# Patient Record
Sex: Female | Born: 1950 | Race: Black or African American | Hispanic: No | Smoking: Former smoker
Health system: Southern US, Community
[De-identification: ages and names within clinical notes are randomized; demographics above are authoritative.]

## PROBLEM LIST (undated history)

## (undated) DIAGNOSIS — G709 Myoneural disorder, unspecified: Secondary | ICD-10-CM

## (undated) DIAGNOSIS — I639 Cerebral infarction, unspecified: Secondary | ICD-10-CM

## (undated) DIAGNOSIS — K429 Umbilical hernia without obstruction or gangrene: Secondary | ICD-10-CM

## (undated) DIAGNOSIS — I10 Essential (primary) hypertension: Secondary | ICD-10-CM

## (undated) DIAGNOSIS — D259 Leiomyoma of uterus, unspecified: Secondary | ICD-10-CM

## (undated) HISTORY — DX: Cerebral infarction, unspecified: I63.9

## (undated) HISTORY — DX: Umbilical hernia without obstruction or gangrene: K42.9

## (undated) HISTORY — PX: ANGIOPLASTY: SHX39

## (undated) HISTORY — PX: HAND SURGERY: SHX662

## (undated) HISTORY — PX: TOE SURGERY: SHX1073

## (undated) HISTORY — DX: Essential (primary) hypertension: I10

---

## 1998-09-12 ENCOUNTER — Encounter: Payer: Self-pay | Admitting: Emergency Medicine

## 1998-09-13 ENCOUNTER — Encounter: Payer: Self-pay | Admitting: Family Medicine

## 1998-09-13 ENCOUNTER — Inpatient Hospital Stay (HOSPITAL_COMMUNITY): Admission: EM | Admit: 1998-09-13 | Discharge: 1998-09-17 | Payer: Self-pay | Admitting: Emergency Medicine

## 1998-09-15 ENCOUNTER — Encounter: Payer: Self-pay | Admitting: Internal Medicine

## 2000-07-27 ENCOUNTER — Encounter: Payer: Self-pay | Admitting: Neurology

## 2000-07-27 ENCOUNTER — Encounter: Payer: Self-pay | Admitting: Emergency Medicine

## 2000-07-27 ENCOUNTER — Inpatient Hospital Stay (HOSPITAL_COMMUNITY): Admission: EM | Admit: 2000-07-27 | Discharge: 2000-08-04 | Payer: Self-pay | Admitting: Neurology

## 2000-07-29 ENCOUNTER — Encounter: Payer: Self-pay | Admitting: Neurology

## 2000-07-30 ENCOUNTER — Encounter: Payer: Self-pay | Admitting: Neurology

## 2000-08-09 ENCOUNTER — Ambulatory Visit (HOSPITAL_COMMUNITY): Admission: RE | Admit: 2000-08-09 | Discharge: 2000-08-09 | Payer: Self-pay | Admitting: Neurology

## 2000-08-12 ENCOUNTER — Ambulatory Visit (HOSPITAL_COMMUNITY): Admission: RE | Admit: 2000-08-12 | Discharge: 2000-08-12 | Payer: Self-pay | Admitting: Neurology

## 2000-08-21 ENCOUNTER — Encounter: Payer: Self-pay | Admitting: Neurology

## 2000-08-21 ENCOUNTER — Ambulatory Visit (HOSPITAL_COMMUNITY): Admission: RE | Admit: 2000-08-21 | Discharge: 2000-08-21 | Payer: Self-pay | Admitting: Interventional Radiology

## 2000-09-01 ENCOUNTER — Inpatient Hospital Stay (HOSPITAL_COMMUNITY): Admission: AD | Admit: 2000-09-01 | Discharge: 2000-09-06 | Payer: Self-pay | Admitting: Neurology

## 2000-09-01 ENCOUNTER — Encounter: Payer: Self-pay | Admitting: Neurology

## 2000-09-04 ENCOUNTER — Encounter: Payer: Self-pay | Admitting: Neurology

## 2001-02-26 ENCOUNTER — Encounter: Payer: Self-pay | Admitting: Emergency Medicine

## 2001-02-26 ENCOUNTER — Encounter: Payer: Self-pay | Admitting: Neurology

## 2001-02-26 ENCOUNTER — Inpatient Hospital Stay (HOSPITAL_COMMUNITY): Admission: AD | Admit: 2001-02-26 | Discharge: 2001-03-13 | Payer: Self-pay | Admitting: Neurology

## 2001-03-04 ENCOUNTER — Encounter: Payer: Self-pay | Admitting: Neurology

## 2001-03-06 ENCOUNTER — Encounter: Payer: Self-pay | Admitting: Neurology

## 2001-03-07 ENCOUNTER — Encounter: Payer: Self-pay | Admitting: Neurology

## 2002-02-12 ENCOUNTER — Ambulatory Visit (HOSPITAL_COMMUNITY): Admission: RE | Admit: 2002-02-12 | Discharge: 2002-02-12 | Payer: Self-pay | Admitting: Family Medicine

## 2002-02-12 ENCOUNTER — Encounter: Payer: Self-pay | Admitting: Family Medicine

## 2003-03-04 ENCOUNTER — Encounter: Payer: Self-pay | Admitting: Family Medicine

## 2003-03-04 ENCOUNTER — Ambulatory Visit (HOSPITAL_COMMUNITY): Admission: RE | Admit: 2003-03-04 | Discharge: 2003-03-04 | Payer: Self-pay | Admitting: Family Medicine

## 2003-03-24 ENCOUNTER — Ambulatory Visit (HOSPITAL_COMMUNITY): Admission: RE | Admit: 2003-03-24 | Discharge: 2003-03-24 | Payer: Self-pay | Admitting: Family Medicine

## 2003-06-14 ENCOUNTER — Encounter: Payer: Self-pay | Admitting: Family Medicine

## 2003-06-14 ENCOUNTER — Ambulatory Visit (HOSPITAL_COMMUNITY): Admission: RE | Admit: 2003-06-14 | Discharge: 2003-06-14 | Payer: Self-pay | Admitting: Family Medicine

## 2004-01-05 ENCOUNTER — Other Ambulatory Visit: Admission: RE | Admit: 2004-01-05 | Discharge: 2004-01-05 | Payer: Self-pay | Admitting: Family Medicine

## 2004-01-14 ENCOUNTER — Ambulatory Visit (HOSPITAL_COMMUNITY): Admission: RE | Admit: 2004-01-14 | Discharge: 2004-01-14 | Payer: Self-pay | Admitting: Family Medicine

## 2004-02-04 ENCOUNTER — Emergency Department (HOSPITAL_COMMUNITY): Admission: EM | Admit: 2004-02-04 | Discharge: 2004-02-04 | Payer: Self-pay | Admitting: Emergency Medicine

## 2004-06-26 ENCOUNTER — Ambulatory Visit: Payer: Self-pay | Admitting: Family Medicine

## 2004-07-06 ENCOUNTER — Ambulatory Visit: Payer: Self-pay | Admitting: Family Medicine

## 2004-08-15 ENCOUNTER — Ambulatory Visit: Payer: Self-pay | Admitting: Family Medicine

## 2004-08-24 ENCOUNTER — Ambulatory Visit: Payer: Self-pay | Admitting: Family Medicine

## 2004-09-10 ENCOUNTER — Emergency Department (HOSPITAL_COMMUNITY): Admission: EM | Admit: 2004-09-10 | Discharge: 2004-09-10 | Payer: Self-pay

## 2004-09-11 ENCOUNTER — Ambulatory Visit: Payer: Self-pay | Admitting: Family Medicine

## 2004-09-13 ENCOUNTER — Ambulatory Visit: Payer: Self-pay | Admitting: Family Medicine

## 2004-09-14 ENCOUNTER — Ambulatory Visit: Payer: Self-pay | Admitting: Nurse Practitioner

## 2004-09-14 ENCOUNTER — Ambulatory Visit (HOSPITAL_COMMUNITY): Admission: RE | Admit: 2004-09-14 | Discharge: 2004-09-14 | Payer: Self-pay | Admitting: Internal Medicine

## 2004-10-03 ENCOUNTER — Ambulatory Visit: Payer: Self-pay | Admitting: Family Medicine

## 2004-11-06 ENCOUNTER — Ambulatory Visit: Payer: Self-pay | Admitting: Family Medicine

## 2004-12-20 ENCOUNTER — Ambulatory Visit: Payer: Self-pay | Admitting: Family Medicine

## 2004-12-27 ENCOUNTER — Ambulatory Visit: Payer: Self-pay | Admitting: Family Medicine

## 2005-01-10 ENCOUNTER — Ambulatory Visit: Payer: Self-pay | Admitting: Family Medicine

## 2005-03-22 ENCOUNTER — Ambulatory Visit: Payer: Self-pay | Admitting: Family Medicine

## 2005-04-05 ENCOUNTER — Ambulatory Visit: Payer: Self-pay | Admitting: Internal Medicine

## 2005-04-19 ENCOUNTER — Ambulatory Visit: Payer: Self-pay | Admitting: Family Medicine

## 2005-04-30 ENCOUNTER — Ambulatory Visit (HOSPITAL_COMMUNITY): Admission: RE | Admit: 2005-04-30 | Discharge: 2005-04-30 | Payer: Self-pay | Admitting: Family Medicine

## 2005-05-02 ENCOUNTER — Emergency Department (HOSPITAL_COMMUNITY): Admission: EM | Admit: 2005-05-02 | Discharge: 2005-05-02 | Payer: Self-pay | Admitting: Emergency Medicine

## 2005-05-04 ENCOUNTER — Ambulatory Visit: Payer: Self-pay | Admitting: Family Medicine

## 2005-05-07 ENCOUNTER — Ambulatory Visit: Payer: Self-pay | Admitting: Family Medicine

## 2005-05-08 ENCOUNTER — Ambulatory Visit: Payer: Self-pay | Admitting: Family Medicine

## 2005-05-15 ENCOUNTER — Ambulatory Visit: Payer: Self-pay | Admitting: Family Medicine

## 2005-06-12 ENCOUNTER — Ambulatory Visit: Payer: Self-pay | Admitting: Family Medicine

## 2005-07-31 ENCOUNTER — Ambulatory Visit: Payer: Self-pay | Admitting: Family Medicine

## 2005-08-02 ENCOUNTER — Ambulatory Visit: Payer: Self-pay | Admitting: Family Medicine

## 2005-08-14 ENCOUNTER — Ambulatory Visit: Payer: Self-pay | Admitting: Family Medicine

## 2005-08-22 ENCOUNTER — Ambulatory Visit: Payer: Self-pay | Admitting: Family Medicine

## 2005-10-18 ENCOUNTER — Ambulatory Visit: Payer: Self-pay | Admitting: Family Medicine

## 2005-10-22 ENCOUNTER — Ambulatory Visit: Payer: Self-pay | Admitting: Family Medicine

## 2005-11-21 ENCOUNTER — Ambulatory Visit: Payer: Self-pay | Admitting: Family Medicine

## 2005-12-04 ENCOUNTER — Ambulatory Visit: Payer: Self-pay | Admitting: Family Medicine

## 2005-12-11 ENCOUNTER — Ambulatory Visit: Payer: Self-pay | Admitting: Family Medicine

## 2006-01-15 ENCOUNTER — Ambulatory Visit: Payer: Self-pay | Admitting: Family Medicine

## 2006-01-15 ENCOUNTER — Encounter (INDEPENDENT_AMBULATORY_CARE_PROVIDER_SITE_OTHER): Payer: Self-pay | Admitting: Family Medicine

## 2006-01-26 ENCOUNTER — Emergency Department (HOSPITAL_COMMUNITY): Admission: EM | Admit: 2006-01-26 | Discharge: 2006-01-26 | Payer: Self-pay | Admitting: Family Medicine

## 2006-02-07 ENCOUNTER — Ambulatory Visit: Payer: Self-pay | Admitting: Family Medicine

## 2006-02-14 ENCOUNTER — Ambulatory Visit: Payer: Self-pay | Admitting: Family Medicine

## 2006-03-28 ENCOUNTER — Ambulatory Visit: Payer: Self-pay | Admitting: Family Medicine

## 2006-04-04 ENCOUNTER — Ambulatory Visit: Payer: Self-pay | Admitting: Family Medicine

## 2006-04-09 ENCOUNTER — Ambulatory Visit: Payer: Self-pay | Admitting: Family Medicine

## 2006-04-17 ENCOUNTER — Ambulatory Visit: Payer: Self-pay | Admitting: Family Medicine

## 2006-05-02 ENCOUNTER — Ambulatory Visit: Payer: Self-pay | Admitting: Nurse Practitioner

## 2006-05-14 ENCOUNTER — Ambulatory Visit: Payer: Self-pay | Admitting: Family Medicine

## 2006-05-15 ENCOUNTER — Ambulatory Visit (HOSPITAL_COMMUNITY): Admission: RE | Admit: 2006-05-15 | Discharge: 2006-05-15 | Payer: Self-pay | Admitting: Family Medicine

## 2006-05-23 ENCOUNTER — Ambulatory Visit: Payer: Self-pay | Admitting: Family Medicine

## 2006-06-12 ENCOUNTER — Ambulatory Visit: Payer: Self-pay | Admitting: Family Medicine

## 2006-06-19 ENCOUNTER — Ambulatory Visit: Payer: Self-pay | Admitting: Family Medicine

## 2006-06-25 ENCOUNTER — Emergency Department (HOSPITAL_COMMUNITY): Admission: EM | Admit: 2006-06-25 | Discharge: 2006-06-25 | Payer: Self-pay | Admitting: Emergency Medicine

## 2006-07-10 ENCOUNTER — Other Ambulatory Visit: Admission: RE | Admit: 2006-07-10 | Discharge: 2006-07-10 | Payer: Self-pay | Admitting: Obstetrics and Gynecology

## 2006-07-10 ENCOUNTER — Encounter (INDEPENDENT_AMBULATORY_CARE_PROVIDER_SITE_OTHER): Payer: Self-pay | Admitting: *Deleted

## 2006-07-10 ENCOUNTER — Ambulatory Visit: Payer: Self-pay | Admitting: Obstetrics and Gynecology

## 2006-07-25 ENCOUNTER — Ambulatory Visit: Payer: Self-pay | Admitting: Internal Medicine

## 2006-07-25 ENCOUNTER — Ambulatory Visit: Payer: Self-pay | Admitting: *Deleted

## 2006-08-14 ENCOUNTER — Ambulatory Visit: Payer: Self-pay | Admitting: Internal Medicine

## 2006-08-27 ENCOUNTER — Ambulatory Visit: Payer: Self-pay | Admitting: Family Medicine

## 2006-10-02 ENCOUNTER — Ambulatory Visit: Payer: Self-pay | Admitting: Internal Medicine

## 2006-11-04 ENCOUNTER — Ambulatory Visit: Payer: Self-pay | Admitting: Family Medicine

## 2006-11-12 ENCOUNTER — Ambulatory Visit: Payer: Self-pay | Admitting: Family Medicine

## 2006-11-19 ENCOUNTER — Ambulatory Visit: Payer: Self-pay | Admitting: Family Medicine

## 2006-12-30 ENCOUNTER — Ambulatory Visit: Payer: Self-pay | Admitting: Family Medicine

## 2007-02-06 ENCOUNTER — Ambulatory Visit: Payer: Self-pay | Admitting: Family Medicine

## 2007-02-13 ENCOUNTER — Ambulatory Visit: Payer: Self-pay | Admitting: Family Medicine

## 2007-02-21 ENCOUNTER — Ambulatory Visit: Payer: Self-pay | Admitting: Family Medicine

## 2007-03-25 ENCOUNTER — Ambulatory Visit: Payer: Self-pay | Admitting: Family Medicine

## 2007-04-08 ENCOUNTER — Ambulatory Visit: Payer: Self-pay | Admitting: Family Medicine

## 2007-04-24 ENCOUNTER — Ambulatory Visit: Payer: Self-pay | Admitting: Family Medicine

## 2007-05-09 ENCOUNTER — Ambulatory Visit (HOSPITAL_COMMUNITY): Admission: RE | Admit: 2007-05-09 | Discharge: 2007-05-09 | Payer: Self-pay | Admitting: Family Medicine

## 2007-05-14 DIAGNOSIS — M109 Gout, unspecified: Secondary | ICD-10-CM

## 2007-05-14 DIAGNOSIS — E669 Obesity, unspecified: Secondary | ICD-10-CM

## 2007-05-14 DIAGNOSIS — I10 Essential (primary) hypertension: Secondary | ICD-10-CM

## 2007-05-14 DIAGNOSIS — J45909 Unspecified asthma, uncomplicated: Secondary | ICD-10-CM | POA: Insufficient documentation

## 2007-05-14 DIAGNOSIS — J301 Allergic rhinitis due to pollen: Secondary | ICD-10-CM | POA: Insufficient documentation

## 2007-05-16 DIAGNOSIS — D259 Leiomyoma of uterus, unspecified: Secondary | ICD-10-CM

## 2007-05-16 DIAGNOSIS — Z87891 Personal history of nicotine dependence: Secondary | ICD-10-CM | POA: Insufficient documentation

## 2007-05-21 ENCOUNTER — Ambulatory Visit: Payer: Self-pay | Admitting: Family Medicine

## 2007-06-24 ENCOUNTER — Ambulatory Visit: Payer: Self-pay | Admitting: Internal Medicine

## 2007-07-22 ENCOUNTER — Ambulatory Visit: Payer: Self-pay | Admitting: Family Medicine

## 2007-08-06 ENCOUNTER — Ambulatory Visit: Payer: Self-pay | Admitting: Family Medicine

## 2007-08-26 ENCOUNTER — Ambulatory Visit: Payer: Self-pay | Admitting: Nurse Practitioner

## 2007-10-08 ENCOUNTER — Ambulatory Visit: Payer: Self-pay | Admitting: Internal Medicine

## 2007-11-05 ENCOUNTER — Ambulatory Visit: Payer: Self-pay | Admitting: Family Medicine

## 2007-12-22 ENCOUNTER — Ambulatory Visit: Payer: Self-pay | Admitting: Family Medicine

## 2008-01-09 ENCOUNTER — Ambulatory Visit: Payer: Self-pay | Admitting: Internal Medicine

## 2008-02-11 ENCOUNTER — Ambulatory Visit: Payer: Self-pay | Admitting: Family Medicine

## 2008-02-23 ENCOUNTER — Ambulatory Visit: Payer: Self-pay | Admitting: Internal Medicine

## 2008-03-23 ENCOUNTER — Ambulatory Visit: Payer: Self-pay | Admitting: Internal Medicine

## 2008-04-22 ENCOUNTER — Ambulatory Visit: Payer: Self-pay | Admitting: Family Medicine

## 2008-04-22 LAB — CONVERTED CEMR LAB
INR: 2.6 — ABNORMAL HIGH (ref 0.0–1.5)
Prothrombin Time: 29.3 s — ABNORMAL HIGH (ref 11.6–15.2)

## 2008-05-19 ENCOUNTER — Ambulatory Visit: Payer: Self-pay | Admitting: Family Medicine

## 2008-05-19 LAB — CONVERTED CEMR LAB: Prothrombin Time: 28.2 s — ABNORMAL HIGH (ref 11.6–15.2)

## 2008-06-24 ENCOUNTER — Ambulatory Visit: Payer: Self-pay | Admitting: Family Medicine

## 2008-06-24 LAB — CONVERTED CEMR LAB: INR: 2.9 — ABNORMAL HIGH (ref 0.0–1.5)

## 2008-07-29 ENCOUNTER — Ambulatory Visit: Payer: Self-pay | Admitting: Family Medicine

## 2008-08-10 ENCOUNTER — Ambulatory Visit: Payer: Self-pay | Admitting: Family Medicine

## 2008-09-01 ENCOUNTER — Encounter (INDEPENDENT_AMBULATORY_CARE_PROVIDER_SITE_OTHER): Payer: Self-pay | Admitting: Internal Medicine

## 2008-09-01 ENCOUNTER — Ambulatory Visit: Payer: Self-pay | Admitting: Internal Medicine

## 2008-09-01 LAB — CONVERTED CEMR LAB
INR: 2 — ABNORMAL HIGH (ref 0.0–1.5)
Prothrombin Time: 24 s — ABNORMAL HIGH (ref 11.6–15.2)

## 2008-10-07 ENCOUNTER — Ambulatory Visit: Payer: Self-pay | Admitting: Family Medicine

## 2008-10-28 ENCOUNTER — Ambulatory Visit: Payer: Self-pay | Admitting: Family Medicine

## 2008-11-03 ENCOUNTER — Ambulatory Visit: Payer: Self-pay | Admitting: Internal Medicine

## 2008-12-02 ENCOUNTER — Ambulatory Visit: Payer: Self-pay | Admitting: Family Medicine

## 2009-01-12 ENCOUNTER — Ambulatory Visit: Payer: Self-pay | Admitting: Internal Medicine

## 2009-01-26 ENCOUNTER — Ambulatory Visit: Payer: Self-pay | Admitting: Family Medicine

## 2009-04-05 ENCOUNTER — Ambulatory Visit: Payer: Self-pay | Admitting: Family Medicine

## 2009-04-19 ENCOUNTER — Ambulatory Visit: Payer: Self-pay | Admitting: Family Medicine

## 2009-04-19 LAB — CONVERTED CEMR LAB
INR: 4.2 — ABNORMAL HIGH (ref 0.0–1.5)
Prothrombin Time: 44 s — ABNORMAL HIGH (ref 11.6–15.2)
aPTT: 53 s — ABNORMAL HIGH (ref 24–37)

## 2009-04-26 ENCOUNTER — Ambulatory Visit: Payer: Self-pay | Admitting: Internal Medicine

## 2009-05-17 ENCOUNTER — Ambulatory Visit: Payer: Self-pay | Admitting: Family Medicine

## 2009-05-17 LAB — CONVERTED CEMR LAB
AST: 18 units/L (ref 0–37)
Albumin: 3.7 g/dL (ref 3.5–5.2)
Alkaline Phosphatase: 53 units/L (ref 39–117)
BUN: 18 mg/dL (ref 6–23)
Basophils Absolute: 0 10*3/uL (ref 0.0–0.1)
Basophils Relative: 0 % (ref 0–1)
Creatinine, Ser: 1.1 mg/dL (ref 0.40–1.20)
Eosinophils Absolute: 0.1 10*3/uL (ref 0.0–0.7)
Eosinophils Relative: 1 % (ref 0–5)
Glucose, Bld: 156 mg/dL — ABNORMAL HIGH (ref 70–99)
HCT: 42.8 % (ref 36.0–46.0)
Hemoglobin: 14.1 g/dL (ref 12.0–15.0)
Lymphocytes Relative: 35 % (ref 12–46)
MCHC: 32.9 g/dL (ref 30.0–36.0)
MCV: 87.5 fL (ref 78.0–100.0)
Monocytes Absolute: 0.8 10*3/uL (ref 0.1–1.0)
Platelets: 299 10*3/uL (ref 150–400)
Potassium: 3.2 meq/L — ABNORMAL LOW (ref 3.5–5.3)
RDW: 14.2 % (ref 11.5–15.5)
Total Bilirubin: 0.3 mg/dL (ref 0.3–1.2)

## 2009-05-24 ENCOUNTER — Ambulatory Visit: Payer: Self-pay | Admitting: Family Medicine

## 2009-06-07 ENCOUNTER — Ambulatory Visit: Payer: Self-pay | Admitting: Family Medicine

## 2009-06-07 ENCOUNTER — Ambulatory Visit: Payer: Self-pay | Admitting: Internal Medicine

## 2009-06-10 ENCOUNTER — Emergency Department (HOSPITAL_COMMUNITY): Admission: EM | Admit: 2009-06-10 | Discharge: 2009-06-10 | Payer: Self-pay | Admitting: Emergency Medicine

## 2009-07-09 ENCOUNTER — Emergency Department (HOSPITAL_COMMUNITY): Admission: EM | Admit: 2009-07-09 | Discharge: 2009-07-09 | Payer: Self-pay | Admitting: Family Medicine

## 2009-07-13 ENCOUNTER — Ambulatory Visit: Payer: Self-pay | Admitting: Family Medicine

## 2009-08-11 ENCOUNTER — Ambulatory Visit: Payer: Self-pay | Admitting: Family Medicine

## 2009-08-25 ENCOUNTER — Ambulatory Visit: Payer: Self-pay | Admitting: Family Medicine

## 2009-09-19 ENCOUNTER — Inpatient Hospital Stay (HOSPITAL_COMMUNITY): Admission: EM | Admit: 2009-09-19 | Discharge: 2009-09-23 | Payer: Self-pay | Admitting: Emergency Medicine

## 2009-09-27 ENCOUNTER — Encounter (INDEPENDENT_AMBULATORY_CARE_PROVIDER_SITE_OTHER): Payer: Self-pay | Admitting: Family Medicine

## 2009-09-27 ENCOUNTER — Ambulatory Visit: Payer: Self-pay | Admitting: Internal Medicine

## 2009-09-27 LAB — CONVERTED CEMR LAB
ALT: 42 units/L — ABNORMAL HIGH (ref 0–35)
CO2: 26 meq/L (ref 19–32)
Calcium: 9.2 mg/dL (ref 8.4–10.5)
Chloride: 100 meq/L (ref 96–112)
Cholesterol: 125 mg/dL (ref 0–200)
Creatinine, Ser: 0.91 mg/dL (ref 0.40–1.20)
Eosinophils Absolute: 0.1 10*3/uL (ref 0.0–0.7)
Glucose, Bld: 135 mg/dL — ABNORMAL HIGH (ref 70–99)
Hemoglobin: 14 g/dL (ref 12.0–15.0)
Lymphs Abs: 3 10*3/uL (ref 0.7–4.0)
MCHC: 32.9 g/dL (ref 30.0–36.0)
MCV: 87.1 fL (ref 78.0–100.0)
Monocytes Absolute: 1.2 10*3/uL — ABNORMAL HIGH (ref 0.1–1.0)
Monocytes Relative: 6 % (ref 3–12)
Neutrophils Relative %: 79 % — ABNORMAL HIGH (ref 43–77)
RBC: 4.88 M/uL (ref 3.87–5.11)
Sodium: 139 meq/L (ref 135–145)
Total Protein: 6 g/dL (ref 6.0–8.3)
Triglycerides: 137 mg/dL (ref <150)
WBC: 20.8 10*3/uL — ABNORMAL HIGH (ref 4.0–10.5)

## 2009-10-11 ENCOUNTER — Ambulatory Visit: Payer: Self-pay | Admitting: Internal Medicine

## 2009-10-18 ENCOUNTER — Ambulatory Visit: Payer: Self-pay | Admitting: Family Medicine

## 2009-10-18 LAB — CONVERTED CEMR LAB
Eosinophils Relative: 1 % (ref 0–5)
HCT: 42.9 % (ref 36.0–46.0)
Hemoglobin: 13.5 g/dL (ref 12.0–15.0)
Lymphocytes Relative: 25 % (ref 12–46)
Lymphs Abs: 3.3 10*3/uL (ref 0.7–4.0)
Monocytes Absolute: 0.9 10*3/uL (ref 0.1–1.0)
Monocytes Relative: 7 % (ref 3–12)
Vit D, 25-Hydroxy: 30 ng/mL (ref 30–89)
WBC: 13.4 10*3/uL — ABNORMAL HIGH (ref 4.0–10.5)

## 2009-10-27 ENCOUNTER — Ambulatory Visit: Payer: Self-pay | Admitting: Family Medicine

## 2009-11-01 ENCOUNTER — Ambulatory Visit: Payer: Self-pay | Admitting: Family Medicine

## 2009-11-04 ENCOUNTER — Ambulatory Visit: Payer: Self-pay | Admitting: Family Medicine

## 2009-11-17 ENCOUNTER — Ambulatory Visit: Payer: Self-pay | Admitting: Family Medicine

## 2009-12-08 ENCOUNTER — Ambulatory Visit: Payer: Self-pay | Admitting: Family Medicine

## 2009-12-12 ENCOUNTER — Telehealth (INDEPENDENT_AMBULATORY_CARE_PROVIDER_SITE_OTHER): Payer: Self-pay | Admitting: *Deleted

## 2009-12-13 ENCOUNTER — Ambulatory Visit: Payer: Self-pay | Admitting: Internal Medicine

## 2009-12-29 ENCOUNTER — Ambulatory Visit: Payer: Self-pay | Admitting: Family Medicine

## 2010-01-05 ENCOUNTER — Ambulatory Visit: Payer: Self-pay | Admitting: Family Medicine

## 2010-01-24 ENCOUNTER — Ambulatory Visit: Payer: Self-pay | Admitting: Family Medicine

## 2010-02-02 ENCOUNTER — Ambulatory Visit: Payer: Self-pay | Admitting: Internal Medicine

## 2010-02-06 ENCOUNTER — Encounter: Payer: Self-pay | Admitting: Interventional Radiology

## 2010-02-17 ENCOUNTER — Encounter (INDEPENDENT_AMBULATORY_CARE_PROVIDER_SITE_OTHER): Payer: Self-pay | Admitting: Adult Health

## 2010-02-17 ENCOUNTER — Ambulatory Visit: Payer: Self-pay | Admitting: Internal Medicine

## 2010-02-17 LAB — CONVERTED CEMR LAB
CO2: 25 meq/L (ref 19–32)
Chloride: 105 meq/L (ref 96–112)
Glucose, Bld: 92 mg/dL (ref 70–99)
Potassium: 4 meq/L (ref 3.5–5.3)
Pro B Natriuretic peptide (BNP): 7.4 pg/mL (ref 0.0–100.0)
Sodium: 142 meq/L (ref 135–145)

## 2010-02-23 ENCOUNTER — Ambulatory Visit: Payer: Self-pay | Admitting: Family Medicine

## 2010-03-01 ENCOUNTER — Ambulatory Visit (HOSPITAL_COMMUNITY): Admission: RE | Admit: 2010-03-01 | Discharge: 2010-03-01 | Payer: Self-pay | Admitting: Family Medicine

## 2010-03-01 ENCOUNTER — Encounter (INDEPENDENT_AMBULATORY_CARE_PROVIDER_SITE_OTHER): Payer: Self-pay | Admitting: Family Medicine

## 2010-03-01 ENCOUNTER — Ambulatory Visit: Payer: Self-pay | Admitting: Vascular Surgery

## 2010-03-02 ENCOUNTER — Ambulatory Visit: Payer: Self-pay | Admitting: Internal Medicine

## 2010-03-06 ENCOUNTER — Ambulatory Visit: Payer: Self-pay | Admitting: Family Medicine

## 2010-03-10 ENCOUNTER — Ambulatory Visit (HOSPITAL_COMMUNITY): Admission: RE | Admit: 2010-03-10 | Discharge: 2010-03-10 | Payer: Self-pay | Admitting: Interventional Radiology

## 2010-03-13 ENCOUNTER — Ambulatory Visit (HOSPITAL_COMMUNITY): Admission: RE | Admit: 2010-03-13 | Discharge: 2010-03-13 | Payer: Self-pay | Admitting: Interventional Radiology

## 2010-03-20 ENCOUNTER — Ambulatory Visit: Payer: Self-pay | Admitting: Family Medicine

## 2010-03-30 ENCOUNTER — Ambulatory Visit: Payer: Self-pay | Admitting: Family Medicine

## 2010-04-05 ENCOUNTER — Ambulatory Visit (HOSPITAL_COMMUNITY): Admission: RE | Admit: 2010-04-05 | Discharge: 2010-04-05 | Payer: Self-pay | Admitting: Family Medicine

## 2010-04-14 ENCOUNTER — Ambulatory Visit: Payer: Self-pay | Admitting: Internal Medicine

## 2010-04-20 ENCOUNTER — Ambulatory Visit: Payer: Self-pay | Admitting: Family Medicine

## 2010-05-10 ENCOUNTER — Ambulatory Visit: Payer: Self-pay | Admitting: Family Medicine

## 2010-06-07 ENCOUNTER — Encounter (INDEPENDENT_AMBULATORY_CARE_PROVIDER_SITE_OTHER): Payer: Self-pay | Admitting: Family Medicine

## 2010-07-05 ENCOUNTER — Encounter (INDEPENDENT_AMBULATORY_CARE_PROVIDER_SITE_OTHER): Payer: Self-pay | Admitting: Family Medicine

## 2010-07-05 LAB — CONVERTED CEMR LAB
INR: 1.8 — ABNORMAL HIGH (ref <1.50)
Prothrombin Time: 21.1 s — ABNORMAL HIGH (ref 11.6–15.2)

## 2010-08-12 ENCOUNTER — Emergency Department (HOSPITAL_COMMUNITY): Admission: EM | Admit: 2010-08-12 | Discharge: 2010-08-12 | Payer: Self-pay | Admitting: Emergency Medicine

## 2010-09-04 ENCOUNTER — Encounter
Admission: RE | Admit: 2010-09-04 | Discharge: 2010-09-04 | Payer: Self-pay | Source: Home / Self Care | Attending: Gastroenterology | Admitting: Gastroenterology

## 2010-10-15 ENCOUNTER — Encounter: Payer: Self-pay | Admitting: Gastroenterology

## 2010-10-24 NOTE — Progress Notes (Signed)
Summary: triage/cold cough  Phone Note Call from Patient   Caller: Patient Reason for Call: Talk to Nurse Summary of Call: Patiient states she has a headache and is coughing her head off. Her grandson is sick with the same..It started Thursday night.Marland KitchenMarland KitchenShe had a sore throat and has been gargling with warm salt water and says her throat is better.. She denies fever.Marland Kitchenand states she is coughing up yellow green phlegm. She denies fever and vomiting and is eating okay..She is taking a generic tussin for coughs and colds.Marland KitchenMarland KitchenPatient has a hx of HTN and I advised her to take mucinex and cloricidin HB instead..be sure to talk to the pharmacist for advise for whats okay with HTN.Marland KitchenMarland KitchenPatient states understanding and will call back if s/s worsen or are not improving. Initial call taken by: Conchita Paris,  December 12, 2009 4:18 PM     Appended Document: triage/cold cough posted to wrong patient.Marland KitchenMarland Kitchen

## 2010-10-24 NOTE — Progress Notes (Signed)
Summary: poss gout.  Phone Note Call from Patient   Caller: Patient Reason for Call: Talk to Doctor Summary of Call: Patient states she has gout in her right foot.Marland KitchenMarland KitchenShe has had gout before and is drinking alot of water...staying away from red meats.Marland Kitchen only drinks a diet soda once in a while.and doesn't drink juices..she denies ETOH use...and has been hospitalized before for gout.Marland KitchenMarland KitchenAppointment made 3/22 in Dr. Hinton Dyer schedule.. Initial call taken by: Conchita Paris,  December 12, 2009 4:05 PM

## 2010-11-22 ENCOUNTER — Other Ambulatory Visit: Payer: Self-pay | Admitting: Gastroenterology

## 2010-12-05 LAB — POCT URINALYSIS DIPSTICK
Bilirubin Urine: NEGATIVE
Ketones, ur: NEGATIVE mg/dL
Specific Gravity, Urine: 1.02 (ref 1.005–1.030)
pH: 5.5 (ref 5.0–8.0)

## 2010-12-05 LAB — WET PREP, GENITAL

## 2010-12-10 LAB — GLUCOSE, CAPILLARY: Glucose-Capillary: 158 mg/dL — ABNORMAL HIGH (ref 70–99)

## 2010-12-10 LAB — BASIC METABOLIC PANEL
BUN: 12 mg/dL (ref 6–23)
GFR calc Af Amer: >60 mL/min (ref >60)
GFR calc non Af Amer: >60 mL/min (ref >60)
Potassium: 4.1 mEq/L (ref 3.5–5.1)

## 2010-12-10 LAB — APTT: aPTT: 26 seconds (ref 24–37)

## 2010-12-10 LAB — CBC
HCT: 43 % (ref 36.0–46.0)
Platelets: 259 10*3/uL (ref 150–400)
RBC: 4.91 MIL/uL (ref 3.87–5.11)
WBC: 8.7 10*3/uL (ref 4.0–10.5)

## 2010-12-10 LAB — CREATININE, SERUM
Creatinine, Ser: 0.86 mg/dL (ref 0.4–1.2)
GFR calc non Af Amer: >60 mL/min (ref >60)

## 2010-12-10 LAB — PROTIME-INR: Prothrombin Time: 13.6 seconds (ref 11.6–15.2)

## 2010-12-25 LAB — PROTIME-INR
INR: 1.43 (ref 0.00–1.49)
INR: 1.57 — ABNORMAL HIGH (ref 0.00–1.49)
INR: 2.16 — ABNORMAL HIGH (ref 0.00–1.49)
INR: 2.85 — ABNORMAL HIGH (ref 0.00–1.49)
Prothrombin Time: 17.3 seconds — ABNORMAL HIGH (ref 11.6–15.2)
Prothrombin Time: 23.9 seconds — ABNORMAL HIGH (ref 11.6–15.2)
Prothrombin Time: 29.3 seconds — ABNORMAL HIGH (ref 11.6–15.2)
Prothrombin Time: 29.7 s — ABNORMAL HIGH (ref 11.6–15.2)

## 2010-12-25 LAB — CBC
HCT: 40.4 % (ref 36.0–46.0)
HCT: 40.7 % (ref 36.0–46.0)
Hemoglobin: 13.4 g/dL (ref 12.0–15.0)
Hemoglobin: 13.5 g/dL (ref 12.0–15.0)
Hemoglobin: 14.4 g/dL (ref 12.0–15.0)
MCHC: 33.1 g/dL (ref 30.0–36.0)
MCHC: 33.3 g/dL (ref 30.0–36.0)
MCV: 88.8 fL (ref 78.0–100.0)
MCV: 89.3 fL (ref 78.0–100.0)
MCV: 89.5 fL (ref 78.0–100.0)
Platelets: 278 10*3/uL (ref 150–400)
Platelets: 336 K/uL (ref 150–400)
Platelets: 345 K/uL (ref 150–400)
RBC: 4.44 MIL/uL (ref 3.87–5.11)
RBC: 4.55 MIL/uL (ref 3.87–5.11)
RBC: 4.56 MIL/uL (ref 3.87–5.11)
RDW: 14.3 % (ref 11.5–15.5)
RDW: 14.3 % (ref 11.5–15.5)
RDW: 14.4 % (ref 11.5–15.5)
RDW: 14.4 % (ref 11.5–15.5)
WBC: 16 K/uL — ABNORMAL HIGH (ref 4.0–10.5)
WBC: 21.5 K/uL — ABNORMAL HIGH (ref 4.0–10.5)
WBC: 26.2 10*3/uL — ABNORMAL HIGH (ref 4.0–10.5)

## 2010-12-25 LAB — URIC ACID: Uric Acid, Serum: 6 mg/dL (ref 2.4–7.0)

## 2010-12-25 LAB — BASIC METABOLIC PANEL
BUN: 10 mg/dL (ref 6–23)
BUN: 14 mg/dL (ref 6–23)
Calcium: 8.7 mg/dL (ref 8.4–10.5)
Calcium: 8.8 mg/dL (ref 8.4–10.5)
Calcium: 9.1 mg/dL (ref 8.4–10.5)
Calcium: 9.1 mg/dL (ref 8.4–10.5)
Chloride: 107 mEq/L (ref 96–112)
Creatinine, Ser: 0.91 mg/dL (ref 0.4–1.2)
GFR calc Af Amer: >60 mL/min (ref >60)
GFR calc Af Amer: >60 mL/min (ref >60)
GFR calc Af Amer: >60 mL/min (ref >60)
GFR calc non Af Amer: >60 mL/min (ref >60)
GFR calc non Af Amer: >60 mL/min (ref >60)
GFR calc non Af Amer: >60 mL/min (ref >60)
GFR calc non Af Amer: >60 mL/min (ref >60)
GFR calc non Af Amer: >60 mL/min (ref >60)
Glucose, Bld: 113 mg/dL — ABNORMAL HIGH (ref 70–99)
Glucose, Bld: 144 mg/dL — ABNORMAL HIGH (ref 70–99)
Glucose, Bld: 295 mg/dL — ABNORMAL HIGH (ref 70–99)
Potassium: 3.9 mEq/L (ref 3.5–5.1)
Sodium: 137 mEq/L (ref 135–145)
Sodium: 139 mEq/L (ref 135–145)
Sodium: 141 mEq/L (ref 135–145)

## 2010-12-25 LAB — GLUCOSE, CAPILLARY
Glucose-Capillary: 156 mg/dL — ABNORMAL HIGH (ref 70–99)
Glucose-Capillary: 203 mg/dL — ABNORMAL HIGH (ref 70–99)
Glucose-Capillary: 266 mg/dL — ABNORMAL HIGH (ref 70–99)
Glucose-Capillary: 280 mg/dL — ABNORMAL HIGH (ref 70–99)
Glucose-Capillary: 307 mg/dL — ABNORMAL HIGH (ref 70–99)

## 2010-12-25 LAB — CULTURE, BLOOD (ROUTINE X 2)
Culture: NO GROWTH
Culture: NO GROWTH

## 2010-12-25 LAB — DIFFERENTIAL
Basophils Absolute: 0.2 10*3/uL — ABNORMAL HIGH (ref 0.0–0.1)
Lymphocytes Relative: 16 % (ref 12–46)
Neutro Abs: 12.1 10*3/uL — ABNORMAL HIGH (ref 1.7–7.7)
Neutrophils Relative %: 71 % (ref 43–77)

## 2010-12-25 LAB — HEMOGLOBIN A1C: Mean Plasma Glucose: 163 mg/dL

## 2011-02-09 NOTE — H&P (Signed)
[place]. [facility]  Patient:    Teresa, Kerr                 MRN: [id number] Adm. Date:  [id number] Attending:  Lesly Dukes             [facility] [address] [facility]  Dr. Leeroy Cha   History and Physical  HISTORY OF PRESENT ILLNESS:  Teresa Kerr is a 60 year old right-handed black female born 1951/04/18 with a history of hypertension, obesity, who presented with a left brain stroke involving the parietal area on July 27, 2000.  The patient was found to have significant stenosis of the left middle cerebral artery, underwent MRI study and MR angiogram showing evidence of tight stenosis in the region of the left super carinal and the internal carotid artery and involving the proximal left middle cerebral artery as well.  Patient is being brought in at this point to come off Coumadin, go on heparin, and possibly undergo an angioplasty procedure or even a stent placement in the left middle cerebral artery.  Patient has done well since her discharge from the hospital and has not had any recurrent episodes of numbness or weakness on the face, arms, or legs, vision changes or speech changes.  PAST MEDICAL HISTORY: 1. 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 2. History of left middle cerebral artery stenosis. 3. History of hypertension. 4. History of asthma. 5. History of obesity.  MEDICATIONS PRIOR TO ADMISSION: 1. Coumadin 5 mg a day. 2. Norvasc 10 mg daily. 3. Hydrochlorothiazide 25 mg daily. 4. Albuterol inhaler if needed.  ALLERGIES:  Patient states no known allergies.  HABITS:  She used to smoke a pack and a half of cigarettes daily, quit in the last several weeks.  The patient drinks alcohol on occasion.  SOCIAL HISTORY:  This patient lives in the [place] area, is single.  FAMILY MEDICAL HISTORY:  Notable for hypertension running in the family. Patients mother died with cancer.   Father died with cancer.  Patient has one sister who is alive and well, another brother that is relatively in good health.  One brother died with HIV infection.  REVIEW OF SYSTEMS:  No recent fevers or chills.  Patient has not had any headaches, visual field changes.  Denies problems with swallowing.  Does note some shortness of breath.  Denies chest pains, denies nausea or vomiting, bowel or bladder control problems.  Notes some slight imbalance with walking. She has not had any falls or blackouts.  PHYSICAL EXAMINATION:  VITAL SIGNS:  Blood pressure is 180/92, heart rate is 90, respiratory rate 20, temperature afebrile.  GENERAL:  The patient is a markedly obese black female who is alert, cooperative at the time of the examination.  HEENT:  Head is atraumatic.  Eyes:  Pupils are equal, round, and reactive to light.  Discs soft, flat bilaterally.  NECK:  Supple, no carotid bruits noted.  RESPIRATORY:  Clear to auscultation and percussion.  CARDIOVASCULAR:  Reveals a regular rate and rhythm without obvious murmurs or rubs.  EXTREMITIES:  Without significant edema.  ABDOMEN:  Reveals an obese abdomen, positive bowel sounds noted.  No tenderness is seen.  EXTREMITIES:  Reveal no significant edema in the lower or upper extremities.  NEUROLOGIC:  Cranial nerves as above.  Facial symmetry is present.  Patient has good strength of facial muscles and the muscles of the head used during shoulder shrug bilaterally.  Patient  has good sensation of the face to pinprick and soft touch bilaterally.  Speech is well enunciated, not aphasic. Motor testing reveals 5/5 strength in all fours, good symmetric motor tone is noted throughout.  Pinprick and soft touch, vibration, proprioception is symmetric and normal.  The patient has good finger-to-nose-to-finger and heel-to-shin bilaterally.  Gait is normal, tandem gait normal.  Romberg negative, no evidence of pronator drift is  seen.  ADMISSION LABORATORY VALUES:  Pending at this time.   EKG and chest x-ray are pending.  IMPRESSION: 1. History of left brain stroke. 2. Left middle cerebral artery stenosis.  PLAN:  This patient may go for angioplasty or stenting procedure.  Will discontinue Coumadin, begin heparin, and check admission blood work.  Will follow patients clinical course while in house. DD:  09/01/00 TD:  09/01/00 Job: [id number]

## 2011-02-09 NOTE — Discharge Summary (Signed)
[place]. [facility]  Patient:    Teresa Kerr, Teresa Kerr                 MRN: [id number] Adm. Date:  [id number] Disc. Date: [id number] Attending:  Durel Salts CC:         [facility]                           Discharge Summary  ADMISSION DIAGNOSIS:  New left brain stroke, suspect left middle cerebral artery stenosis.  DISCHARGE DIAGNOSIS:  Left brain stroke secondary to left middle cerebral artery restenosis.  PROCEDURES:  Endovascular angioplasty performed of the left MCA by Dr. Corliss Skains.  ADDITIONAL DIAGNOSES: 1. Hypertension. 2. Previous cerebrovascular disease. 3. Obesity.  DISCHARGE MEDICATIONS: 1. Coumadin.  The dose will vary.  She is going to start out at 5 mg    1-1/2 tablets daily while that is monitored. 2. Aspirin 81 mg 1 a day. 3. Plavix 75 mg 1 a day. 4. Norvasc 5 mg 1 a day. 5. Hydrochlorothiazide 25 mg 1 a day. 6. Neurontin 40 mg three times a day.  ACTIVITY:  She will need outpatient physical, occupational, and speech therapy three times a week for four weeks.  She is to gradually resume work as tolerated and not to return to work until she is cleared by Dr. Thad Ranger.  DIET:  Low fat, low cholesterol.  SPECIAL INSTRUCTIONS:  She needs to go to short-stay or [facility] for blood testing on Friday and Monday to check her PT and INR to adjust her Coumadin dose.  FOLLOW-UP:  At [facility] for a blood pressure checkup.  Follow up in four weeks with Dr. Irish Lack., Demetrio Lapping.  The patient is to call [phone].  CONDITION ON DISCHARGE:  Stable.  Unchanged since admission, with a moderately severe dysarthria, mild tongue deviation, right facial droop, and right arm clumsiness.  She is ambulatory.  PROCEDURES: 1. On February 27, 2001, she had a carotid Doppler study that showed no significant    ICA stenosis bilaterally, no significant change from six months previous.    Right mild intimal thickening in the  common carotid artery.  Left mild    noncalcific plaque in the common carotid artery. 2. MRI scan of the brain on February 26, 2001, showed an acute infarct within    the left parietal lobe of the MCA territory, largely anterior to the    region of ischemia seen in November 2001.  There were several small patches    consistent with embolus. 3. MR angiogram on the same date showed pronounced stenotic disease at the    distal left ICA A1 ______ region with compromised circulation of middle    cerebral artery territory distribution.  Several missing MCA branches;    some were new, missing since the previous exam on July 29, 2000. 4. The patient underwent a diagnostic angiography which was also therapeutic    that showed severe left MCA stenosis proximal and underwent angioplasty    of the left M1 segment to 2 mm with mild recoil in the angioplastied stent.    Overall improved flow to the angioplasty system and also some distal MCA    distribution disease. 5. CT scan of the brain on February 26, 2001, showed an old left parietal,    cortical, and subcortical infarct, unspecified where.  Did not show acute    disease.  Risk factors ______  stroke of the recent left CVA and hypertension.  HOSPITAL COURSE:  Please refer to the history and physical for details. Briefly, this is a 60 year old woman with a previous history of a left brain stroke from left MCA stenosis, who underwent angioplasty in November.  She presented at this time with an acute onset when she woke up with increased facial weakness on the right, dysarthria, and right hand clumsiness.  We thought the findings were similar to what she had had previously although definitely were new.  The initial CT failed to reveal any new strokes but, as noted, the MRI did reveal that she had had a new stroke.  She was placed on heparin, eventually underwent the angiogram as described by Dr. Corliss Skains, and had angioplasty with stent placement done.   She had an unremarkable hospital course.  After the angioplasty, which went well, she was going to be converted from heparin to Coumadin and, other than a somewhat recalcitrant metabolism with difficulty achieving the therapeutic level, her stay was unremarkable. Her INR at the time of discharge was 1.9 on a dose of 7.5 mg of Coumadin a day.  She is to have another level drawn tomorrow and then again Monday on the same dose, and we will make adjustments from there as needed.  Follow-up is already outlined at [facility] for primary medical concerns and with Dr. Thad Ranger for the neurologic concerns. DD:  03/13/01 TD:  03/14/01 Job: [id number]

## 2011-02-09 NOTE — H&P (Signed)
[facility]  Patient:    Teresa Kerr, Teresa Kerr                 MRN: [id number] Adm. Date:  [id number] Attending:  Durel Salts                         History and Physical  CHIEF COMPLAINT:  Slurred speech, mouth draw.  HISTORY OF PRESENT ILLNESS:  Teresa Kerr is a 60 year old right-handed woman with history of hypertension who suffered a left parietal stroke November 1 with minimal residual right hemiparesis.  This was secondary to an MCA branch stenosis and she underwent an endovascular angioplasty of the MCA and was sent home on Plavix and aspirin.  She did fine except for minimal residual right-sided weakness.  This morning she woke up with her face drawn, dysarthria on the left side, and headache.  REVIEW OF SYSTEMS:  Positive for some left lower extremity edema which is chronic, mild right-sided leg pain and weakness which is also chronic.  She had some pain and weakness in the last two fingers of her right hand.  She is scheduled for a nerve conduction EMG this week to determine whether this could be an ulnar problem, apparently.  No chest pain.  No shortness of breath.  PAST MEDICAL HISTORY:  Significant for hypertension, cerebrovascular atherosclerosis with stroke with a left posterior MCA stenosis and obesity.  MEDICATIONS: 1. Plavix 75 mg. 2. Aspirin. 3. Hydrochlorothiazide. 4. Norvasc. 5. Lasix.  ALLERGIES:  No known drug allergies.  SOCIAL HISTORY:  She is separated.  Lives alone currently.  Her son is with her today.  She quit smoking after the first stroke.  She occasionally drinks alcohol.  Denies cocaine or ecstasy.  FAMILY HISTORY:  Positive for hypertension.  No stroke.  PHYSICAL EXAMINATION  VITAL SIGNS:  Temperature 97.4, on admission blood pressure 143/100, come down to 130/88 spontaneously, pulse 93, respirations 18.  HEENT:  Head is normocephalic, atraumatic.  NECK:  Supple without bruits.  HEART:   Regular rate and rhythm.  LUNGS:  Distant, but clear.  EXTREMITIES:  Diffuse edema in the left lower extremity, but not right.  ABDOMEN:  Obese, positive bowel sounds, soft, nontender.  NEUROLOGIC:  Mental status:  She is awake and alert, very dysarthric but her content appears normal.  She follows simple commands, but cannot follow more complex commands.  For example, I asked her to point to the ceiling after she pointed to the floor and she could not get the sequence in the correct order. Cranial nerves:  Pupils are equal and reactive.  Visual fields are full to confrontation.  Disk margins appear sharp.  Extraocular movements are intact. Facial sensation is normal.  She has a central right seventh palsy with right facial droop.  Hearing is intact.  Palate is symmetric.  It is kind of hard to see because of her facial weakness but she has a good gag and her tongue deviates slightly to the right.  There is no field cut or sensory loss.  Good shoulder shrug.  Motor examination:  Minimal right upper extremity drift. Decreased rapid fine movement with a clawed hand appearance on the right. Question whether this could be an ulnar or CAT1 damage.  Otherwise, good gross motor strength in the upper extremity.  Slight proximal weakness 4/5 of the right lower extremity.  Gait was not assessed.  Reflexes 3+ and symmetric with downgoing toes.  Coordination:  Finger-to-nose  and heel-to-shin are normal. Sensory examination is intact to primary modalities and double simultaneous stimulation.  LABORATORIES:  CT scan shows no right parietal MCA branch stroke, no new clear acute findings.  Sodium 141, potassium 3.8, chloride 110, CO2 26, BUN 17, creatinine 0.8, glucose 136, calcium 8.7, albumin 3.3, protein 7.0, AST 15, T bilirubin 0.6, ALT 18.  PT 13.1 with INR 1.0, PTT 29.  White blood cell count 8.9, hemoglobin 12.6, hematocrit 38.6, platelets 359,000.  IMPRESSION:  Right facial weakness,  tongue deviation, dysarthria.  Rule out left brain stroke.  This could be a new stroke in the brain stem or a new left MCA stroke which could be related to the known MCA stenosis that she had angioplastied last fall.  PLAN:  Will start her on IV heparin, IV fluids, oxygen for supine.  Will check an MRI of the brain and MR angiogram.  She may need to be reangioplastied to see if the stent is still patent. DD:  02/26/01 TD:  02/26/01 Job: [id number]

## 2011-02-09 NOTE — Discharge Summary (Signed)
[place]. [facility]  Patient:    CASSADI, PURDIE                 MRN: [id number] Adm. Date:  [id number] Disc. Date: 08/04/00 Attending:  Fenton Malling                           Discharge Summary  FINAL DIAGNOSES: 1. Left parietal cerebrovascular accident, possibly embolic versus watershed.    434.01 2. Hypertension without congestive heart failure.  404.10 3. Asthma. 4. Obesity.  PROCEDURES: 1. CT scan of the brain. 2. MRI of the brain, MRA intracranial. 3. 2-D echocardiogram. 4. Carotid Doppler. 5. Modified barium swallow.  COMPLICATIONS:  None.  SUMMARY OF THE HOSPITALIZATION:  Ms. Lineberry is a 60 year old African-American woman with medical conditions as noted above, who presented with an acute right facial weakness, numbness, and aphasia.  A CT scan of the brain showed an acute infarction in the left posterior parietal region on July 27, 2000.  An MRI scan and MRA on July 29, 2000, showed a large left parietal watershed lesion and also very small scattered punctate lesions in the left frontal and parietal white matter.  MR angiogram at the same time showed a tight (no flow) of the supraclinoid left internal carotid artery involving both the M1 and A1 segments of the middle cerebral and anterior cerebral arteries.  There was a left prominent lesion on the right in the same location.  The left cavernous carotid showed mild non-occlusive disease.  A 2-D echocardiogram on July 29, 2000, showed small left ventricle hyperdynamic function with an ejection fraction greater than 75%, moderate to marked left ventricular hypertrophy, dynamic left mid cavity obstruction during Valsalva, and no echocardiographic evidence of clot or hypokinesis.  A carotid Doppler on July 29, 2000, showed intimal wall changes in the right common carotid, mild hypnogenous plaque in the left common carotid, no hemodynamically significant stenosis,  and antegrade vertebral flow.  A modified barium swallow on July 30, 2000, was normal.  The patient had a battery of tests, including anticardiolipin antibody which showed IgA less than 14, IgG less than 12, and IgM less than 11.  Sedimentation rate 5. Homocystine 10.64 (normal).  Lipid panel with cholesterol 157, triglycerides 80, HDL 38, LDL 103, and ratio 4.1.  Factor Leiden V mutation negative.  Protein S, protein C, and antithrombin 3 were not done because the anticoagulants had already been started.  The patient made steady improvement throughout the hospitalization.  She was seen by speech therapy and was able to progress from severe dysnomia, dysfluency, and difficulty repeating phrases to an ability to speak fluently and to repeat phrases without difficulty.  She did not show signs of focal neurologic deficits.  On her examination today, the blood pressure is 146/96 (this has varied in the last 24 hours between 130-146 systolic and 67-96 diastolic.  We are allowing this to continue without further intervention because of the tight stenoses.  Pulse 82, respirations 20, temperature 98.7 degrees, pulse oximetry 94% on room air.  Intake and output of the patient has been fine.  The lungs are clear.  Heart with no murmurs.  Pulses normal.  No bruits.  The lungs had showed wheezing now and again.  They have been fairly clear and she is moving air very well. The abdomen is soft and nontender.  Extremities without edema.  The neurologic examination at this time is normal in terms  of speech and language, cranial nerves, motor, sensory, and gait.  Reflexes are symmetric and normal, possibly diminished distally.  The patient had bilateral flexor plantar responses.  Her prothrombin times have been hard to adjust.  She received 12 mg of Coumadin yesterday and this pushed her prothrombin time out from 18.9 and an INR of 2.0 to 21.3 and an INR of 2.4.  The CBC today shows a hemoglobin  of 13.8, hematocrit 38.4, MCV 84.6, white count 9900, and platelet count 312,000.  The most recent metabolic panel shows a sodium of 138, potassium 4.3, chloride 101, CO2 29, glucose 109, BUN 20, creatinine 0.9, and calcium 9.2.  A modified barium swallow on July 30, 2000, was normal.  CONDITION ON DISCHARGE:  She is discharged home in improved condition.  DISCHARGE MEDICATIONS: 1. Hydrochlorothiazide 25 mg in the morning. 2. Norvasc 10 mg in the morning. 3. Albuterol inhaler two puffs three times a day. 4. Coumadin 5 mg two daily at 5 p.m.  DISPOSITION:  The patient is discharged in improved condition with medications as noted above.  DIET:  She to have a low-fat, low-salt diet.  To avoid collard greens, spinach, and broccoli.  FOLLOW-UP:  She will have prothrombin times drawn on August 05, 2000; August 07, 2000; August 09, 2000; and August 12, 2000, at [place]. [facility].  She will return on August 19, 2000, for reversal of her Coumadin.  She will be placed on heparin and an angioplasty procedure will be carried out.  This will be done by Julieanne Cotton, M.D.  She will be followed by me long term.  It is my understanding that the patient is indigent.  [facility] been working with her.  It is my hope that we can get her involved with [facility] so that she can have ongoing regular medical are when she goes home. DD:  08/04/00 TD:  08/04/00 Job: [id number]

## 2011-02-09 NOTE — Discharge Summary (Signed)
[place]  Patient:    Teresa Kerr, Teresa Kerr                 MRN: [id number] Adm. Date:  [id number] Disc. Date: [id number] Attending:  Lesly Dukes CC:         Julieanne Cotton, M.D.  [facility], [address], [place], [place]   Discharge Summary  ADMISSION DIAGNOSES: 1. History of left brain stroke. 2. Left middle cerebral artery stenosis.  DISCHARGE DIAGNOSES: 1. Left brain stroke. 2. Left middle cerebral artery stenosis.  PROCEDURES: 1. Cerebral angiogram. 2. Angioplasty of the left middle cerebral artery.  COMPLICATIONS:  None.  HISTORY OF PRESENT ILLNESS:  Teresa Kerr is a 60 year old right-handed black female, born 25-Sep-1950, with a history of hypertension, obesity, who presented with left brain stroke involving the parietal area on July 27, 2000.  The patient was found to have a very significant stenosis of the left middle cerebral artery.  Underwent an MRI study with MR angiogram showing evidence of the tight stenosis.  The patient was felt to have some left supraclinoid internal carotid artery stenosis as well.  The patient was brought back in for possible angioplasty procedure or stent placement into the left middle cerebral artery.  The patient has done well and following discharge has been on Coumadin therapy.  PAST MEDICAL HISTORY: 1. History of left parietal stroke July 27, 2000. 2. History of left middle cerebral artery stenosis. 3. History of hypertension. 4. History of asthma. 5. History of obesity.  MEDICATIONS PRIOR TO ADMISSION: 1. Coumadin 5 mg daily. 2. Norvasc 10 mg daily. 3. Hydrochlorothiazide 25 mg daily. 4. Albuterol inhaler if needed.  ALLERGIES:  No known allergies.  TOBACCO/ALCOHOL:  Smoked 1-1/2 packs of cigarettes a day, but quit in the last several weeks.  The patient drinks alcohol on occasion.  Please refer to the history and physical dictation  summary for social history, family history, review of systems, and physical examination.  LABORATORY DATA:  Sodium 136, potassium 3.8, chloride of 101, CO2 27, glucose of 132, BUN of 11, creatinine 0.7, calcium 8.5.  INR of 1.2 yesterday.  White count is 13.0, hemoglobin 12.6, hematocrit of 36.3, MCV of 84.4, platelets of 306.  ALT 27, ALP of 72, total bilirubin 0.3.  Chest x-ray shows no acute changes.  EKG reveals normal sinus rhythm, right bundle branch block.  Heart rate of 71.  HOSPITAL COURSE:  The patient has done well during the course of hospitalization.  The patient was admitted for possible angioplasty procedure involving the left middle cerebral artery.  The patient was seen by Dr. Corliss Skains.  The patient was taken off Coumadin on admission and was placed on heparin therapy.  The patient was seen for the cerebral angiogram, which was done on September 04, 2000.  The patient underwent cerebral angiogram and had angioplasty of the left middle cerebral artery M1 segment.  The patient was angioplastied x 3.  The patient had some spasm of the left internal carotid artery which responded to intra-arterial nitroglycerin.  The patient was placed on ReoPro and IV heparin.  Following the procedure, the patient responded appropriately.  The patient had a very good recovery in the intensive care unit for 24 hours or so following the procedure.  At this time, the patient is back to her baseline, is fully alert and cooperative.  Full visual field, speech is normal.  Normal motor strength and sensation.  The patient is, again,  ambulatory and eating normally.  At this point, the patient will be discharged to home on aspirin and Plavix.  This patient will follow up with [facility] within two to three weeks following discharge.  MEDICATIONS:  At this point, the patient is discharged on: 1. Aspirin 325 mg a day. 2. Plavix 75 mg a day. 3. Norvasc 10 mg daily. 4.  Albuterol inhaler if needed. 5. Hydrochlorothiazide 25 mg 1 a day.  DISCHARGE INSTRUCTIONS:  The patient is to remain off cigarettes.  To have no strenuous activity for six weeks.  Have a no-added-salt diet.  FOLLOW-UP:  The patient will be seen through [facility], seeing Bennett Scrape. Thad Ranger. DD:  09/06/00 TD:  09/06/00 Job: [id number]

## 2011-02-09 NOTE — Group Therapy Note (Signed)
NAMEVON, INSCOE NO.:  [id number]   MEDICAL RECORD NO.:  [id number]          PATIENT TYPE:  WOC   LOCATION:  [facility]                   FACILITY:  [facility]   PHYSICIAN:  Wilburt Finlay, M.D.     DATE OF BIRTH:  1951-05-22   DATE OF SERVICE:  07/10/2006                                    CLINIC NOTE   CHIEF COMPLAINT:  The patient referred from [facility] secondary to uterine  fibroid.   HISTORY OF PRESENT ILLNESS:  The patient is a 60 year old gravida 3, para 3-  0-0-3, who is 3 years postmenopausal, who has been having lower abdominal  pain for several months.  The patient had an ultrasound done at [facility],  which revealed a uterus that measures 8.7 x 5.0 x 4.6 cm, also has a 1.7 cm  anterior intramural fibroid, endometrial stripe thickness is measured up to  1.6 cm.  This thickness was considered abnormal in a postmenopausal female,  and further evaluation was indicated.  The patient also reports that a Pap  smear done in July or August of this year that she believes was normal, has  no history of abnormal Pap smears.  The patient denies any postmenopausal  bleeding, is sexually active with some dyspareunia.   PAST MEDICAL HISTORY:  Significant for:  1. Arthritis.  2. Asthma.  3. High blood pressure.  4. Emphysema.  5. History of a deep venous thrombosis 25 years ago.  6. The patient also has a history of stroke x3, is on Coumadin for      strokes.   OUTPATIENT MEDICATIONS:  Coumadin, Plavix, hydrochlorothiazide, Lasix,  Norvasc, Neurontin, Advair, Nasonex, sleeping pills, and a medicine for  gout.   FAMILY HISTORY:  Significant for a sister with diabetes, a mother with  breast cancer, father with stomach cancer, niece with breast cancer.   SOCIAL HISTORY:  The patient denies any smoking, does drink socially, denies  any illicit drug use.   REVIEW OF SYSTEMS:  The patient denies any weight loss, any fevers, chills,  night sweats.   PHYSICAL  EXAMINATION:  VITAL SIGNS:  Temperature 97.1, pulse 81, blood  pressure 144/94.  Her heart exam was regular.  LUNGS:  Clear.  ABDOMEN:  Soft, nontender, nondistended.  PELVIC EXAM:  The cervix appeared normal, the uterus small and anteverted.   An endometrial biopsy was done, no complications.   ASSESSMENT AND PLAN:  This is a 60 year old, postmenopausal female with  thickened endometrial stripe.  Endometrial biopsy done today, the patient is  to return in 2 weeks to follow up for test results.           ______________________________  Wilburt Finlay, M.D.     LJ/MEDQ  D:  07/10/2006  T:  07/12/2006  Job:  [id number]

## 2011-02-09 NOTE — H&P (Signed)
[place]  Patient:    Teresa Kerr, Teresa Kerr                 MRN: [id number] Adm. Date:  [id number] Attending:  Fenton Malling                         History and Physical  DATE OF BIRTH:  [birth year]  CHIEF COMPLAINT:  Right face weakness and slurring of speech.  NOTATION:  Note that this is a patient who in the [facility], and is being transferred to the [facility] Emergency Room.  HISTORY OF PRESENT ILLNESS:  This 60 year old black female with a past medical history of hypertension and asthma, who was well until yesterday, when she reports that all day she had a persistent left frontal headache.  She had no neurological symptoms at that time.  This morning she awoke and noticed that the right side of her face was "drawn" and that she had some numbness in the right cheek and difficulty with speech.  The symptoms have waxed and waned over the course of the day, but they have not resolved.  She denies any weakness or numbness of the limbs, any visual changes, or any bowel or bladder symptoms.  She has no pain.  She denies any associated chest pain or palpitations.  PAST MEDICAL HISTORY: 1. Hypertension, for which she is fairly compliant with her medications    although does admit to missing some doses. 2. She also has asthma.  FAMILY HISTORY:  Positive for hypertension.  SOCIAL HISTORY:  She lives alone.  She smokes about 1-1/2 packs of cigarettes a day.  She uses alcohol "socially."  ALLERGIES:  No known drug allergies.  CURRENT MEDICATIONS: 1. Inhalers for asthma. 2. Hydrochlorothiazide, unknown dose. 3. Norvasc, unknown dose.  REVIEW OF SYSTEMS:  HEAD:  Remarkable for a headache as above.  EYES:  She uses glasses.  EARS:  Negative.  ENT:  Negative.  CV:  Negative.  RESPIRATORY: She has baseline shortness of breath and occasional nonproductive cough.   GI: She has a mild  intermittent abdominal pain and indigestion.  Denies melena or bright red blood per rectum.  GENITOURINARY:  Denies hematuria.  She is presently experiencing heave menses.  HEME:  Negative.  PSYCH:  Negative.  PHYSICAL EXAMINATION:  VITAL SIGNS:  Temperature 97.6 degrees, blood pressure 210/126 on arrival, down to 126/109 at present.  Pulse 89, respirations 20.  GENERAL/MENTAL STATUS:  She is awake, alert, and in no evident distress.  She is completely oriented.  Her speech is moderately dysarthric.  HEENT:  Cranium:  Normocephalic, atraumatic.  Oropharynx benign.  NECK:  Supple without bruits.  CHEST:  Reveals diffuse wheezes.  HEART:  A regular rate and rhythm, without murmurs.  ABDOMEN:  Soft, with normoactive bowel sounds.  There is mild bilateral lower quadrant tenderness.  EXTREMITIES:  Trace pretibial edema, with 2+ pulses in the lower extremities.  NEUROLOGIC:  Cranial nerves:  Discs are sharp.  Pupils are equal and briskly reactive.  Extraocular movements are normal without nystagmus.  Visual fields full to confrontation.  There is a right facial droop and right lower facial weakness.  Palate and tongue move in the midline.  Motor examination:  Normal bulk and tone.  Normal strength in all tested extremity muscles.  Sensation is intact to light touch and pain in all four extremities.  Rapid alternating  movements are normal.  Cerebellar function is normal.  Gait examination is deferred.  Deep tendon reflexes all generally increased.  Hoffmans sign is present bilaterally.  Toes are downgoing bilaterally.  There is no ankle clonus.  LABORATORY DATA:  CBC reveals white cells 11.4, hemoglobin 14.4, platelets 321,000.  BMET is unremarkable.  Electrocardiogram is normal.  A CT of the head reveals an acute nonhemorrhagic infarction in the left posterior parietal area.  IMPRESSION: 1. Left brain stroke, with right facial symptoms and dysarthria. 2. Hypertension. 3.  Asthma.  PLAN: 1. Heparin per stroke protocol. 2. MRI of the brain with MRA. 3. Carotid Doppler. 4. Echocardiogram. 5. Speech evaluation to clear swallow. 6. Disposition, pending above. DD:  07/27/00 TD:  07/29/00 Job: [id number]

## 2011-07-27 ENCOUNTER — Ambulatory Visit (INDEPENDENT_AMBULATORY_CARE_PROVIDER_SITE_OTHER): Payer: Medicare Other | Admitting: Obstetrics and Gynecology

## 2011-07-27 ENCOUNTER — Other Ambulatory Visit: Payer: Self-pay | Admitting: Obstetrics and Gynecology

## 2011-07-27 VITALS — BP 152/92 | HR 73 | Temp 96.9°F | Ht 64.5 in | Wt 274.4 lb

## 2011-07-27 DIAGNOSIS — N950 Postmenopausal bleeding: Secondary | ICD-10-CM

## 2011-07-27 LAB — CBC
HCT: 43.3 % (ref 36.0–46.0)
Hemoglobin: 14.1 g/dL (ref 12.0–15.0)
MCH: 28.4 pg (ref 26.0–34.0)
MCHC: 32.6 g/dL (ref 30.0–36.0)
MCV: 87.1 fL (ref 78.0–100.0)

## 2011-07-27 NOTE — Progress Notes (Signed)
S: Pt presents today for post-menopausal bleeding. She states she has had this problem "on and off" for years. She was diagnosed with fibroids and had an endometrial biopsy in 2007. She states she will bleed for several days and then stop. At the heaviest, she states she changes her pad about 3 times a day. She has multiple comorbidities. See PMH. O: VSS A&O x 3 in NAD Abd obese, soft, slightly tender to palpation. NL external genitalia. Bimanual exam difficult secondary to morbid obesity. No obvious masses noted. Pt slightly tender to palpation. Minimal bleeding noted on exam. A/P: Post-menopausal bleeding: will get pelvic US and have pt f/u in 2wks for endometrial biopsy. Will also check CBC, PT/PTT/INR. Discussed diet, activity, risks, and precautions.  Clinton Gallant. Cyd Hostler III, DrHSc, MPAS, PA-C

## 2011-07-27 NOTE — Progress Notes (Signed)
Pt has already received the flu vaccine.

## 2011-07-31 ENCOUNTER — Ambulatory Visit (HOSPITAL_COMMUNITY): Admission: RE | Admit: 2011-07-31 | Payer: Medicare Other | Source: Ambulatory Visit

## 2011-08-08 ENCOUNTER — Other Ambulatory Visit: Payer: Medicare Other | Admitting: Physician Assistant

## 2011-09-10 ENCOUNTER — Other Ambulatory Visit: Payer: Medicare Other | Admitting: Family Medicine

## 2012-03-26 ENCOUNTER — Encounter (HOSPITAL_COMMUNITY): Payer: Self-pay

## 2012-03-26 ENCOUNTER — Emergency Department (INDEPENDENT_AMBULATORY_CARE_PROVIDER_SITE_OTHER)
Admission: EM | Admit: 2012-03-26 | Discharge: 2012-03-26 | Disposition: A | Discharge status: Home / Self Care | Payer: PRIVATE HEALTH INSURANCE | Source: Home / Self Care | Attending: Emergency Medicine | Admitting: Emergency Medicine

## 2012-03-26 DIAGNOSIS — M546 Pain in thoracic spine: Secondary | ICD-10-CM

## 2012-03-26 MED ORDER — GABAPENTIN 100 MG PO CAPS: 100.0000 mg | ORAL_CAPSULE | Freq: Three times a day (TID) | ORAL | Status: DC

## 2012-03-26 NOTE — ED Provider Notes (Signed)
History     CSN: [id number]  Arrival date & time 03/26/12  1702   First MD Initiated Contact with Patient 03/26/12 1829      Chief Complaint  Patient presents with  . Back Pain    (Consider location/radiation/quality/duration/timing/severity/associated sxs/prior treatment) Patient is a 61 y.o. female presenting with back pain. The history is provided by the patient.  Back Pain  This is a recurrent problem. The problem occurs constantly. The problem has been gradually worsening. The pain is associated with no known injury. The pain is present in the thoracic spine. The quality of the pain is described as aching. The pain is at a severity of 6/10. The pain is moderate. The symptoms are aggravated by twisting, bending and certain positions. The pain is worse during the day. Pertinent negatives include no chest pain, no fever, no numbness, no weight loss, no abdominal pain, no abdominal swelling, no bowel incontinence, no perianal numbness, no dysuria, no pelvic pain, no leg pain, no paresthesias, no tingling and no weakness. She has tried nothing for the symptoms. The treatment provided no relief.    Past Medical History  Diagnosis Date  . Hypertension   . Stroke   . Asthma   . Diabetes mellitus   . Gout     Past Surgical History  Procedure Date  . Angioplasty     x 2 times  . Hand surgery     Family History  Problem Relation Age of Onset  . Cancer Mother     breast/ had mastecomy  . Cancer Father     colon    History  Substance Use Topics  . Smoking status: Current Everyday Smoker  . Smokeless tobacco: Never Used  . Alcohol Use: 1.2 oz/week    2 Glasses of wine per week     socially    OB History    Grav Para Term Preterm Abortions TAB SAB Ect Mult Living   3 3 3       3       Review of Systems  Constitutional: Negative for fever, chills, weight loss, diaphoresis, activity change, appetite change and fatigue.  HENT: Negative for congestion, rhinorrhea, neck  pain and neck stiffness.   Respiratory: Negative for cough.   Cardiovascular: Negative for chest pain.  Gastrointestinal: Negative for abdominal pain and bowel incontinence.  Genitourinary: Negative for dysuria and pelvic pain.  Musculoskeletal: Positive for back pain. Negative for myalgias, joint swelling, arthralgias and gait problem.  Skin: Negative for pallor and rash.  Neurological: Negative for tingling, weakness, numbness and paresthesias.    Allergies  Review of patient's allergies indicates no known allergies.  Home Medications   Current Outpatient Rx  Name Route Sig Dispense Refill  . ALBUTEROL SULFATE HFA 108 (90 BASE) MCG/ACT IN AERS Inhalation Inhale 2 puffs into the lungs every 6 (six) hours as needed.      Marland Kitchen AMLODIPINE BESYLATE 10 MG PO TABS Oral Take 10 mg by mouth daily.      Marland Kitchen CLONIDINE HCL 0.1 MG PO TABS Oral Take 0.1 mg by mouth daily.      Marland Kitchen CLOPIDOGREL BISULFATE 75 MG PO TABS Oral Take 75 mg by mouth daily.      . COLCHICINE 0.6 MG PO TABS Oral Take 0.6 mg by mouth daily.      Marland Kitchen FLUTICASONE-SALMETEROL 500-50 MCG/DOSE IN AEPB Inhalation Inhale 1 puff into the lungs every 12 (twelve) hours.    . FUROSEMIDE 20 MG PO TABS Oral Take  20 mg by mouth 2 (two) times daily. Am/ noon     . HYDROCODONE-ACETAMINOPHEN 5-500 MG PO TABS Oral Take 1 tablet by mouth every 6 (six) hours as needed.      Marland Kitchen LISINOPRIL 10 MG PO TABS Oral Take 10 mg by mouth daily.      Marland Kitchen METFORMIN HCL 500 MG PO TABS Oral Take 500 mg by mouth 2 (two) times daily.      . MOMETASONE FUROATE 50 MCG/ACT NA SUSP Nasal Place 2 sprays into the nose as needed.      . NYSTATIN 100000 UNIT/GM EX CREA Topical Apply 1 application topically as needed.      Marland Kitchen OMEPRAZOLE 20 MG PO CPDR Oral Take 20 mg by mouth daily.      . WARFARIN SODIUM 7.5 MG PO TABS Oral Take 7.5 mg by mouth daily.        BP 146/111  Pulse 96  Temp 97.6 F (36.4 C) (Oral)  Resp 21  SpO2 97%  Physical Exam  Nursing note and vitals  reviewed. Constitutional: She appears well-developed and well-nourished.  Non-toxic appearance. She does not have a sickly appearance. She does not appear ill. No distress.    HENT:  Head: Normocephalic.  Eyes: Conjunctivae are normal. Right eye exhibits no discharge. Left eye exhibits no discharge. No scleral icterus.  Neck: Neck supple.  Pulmonary/Chest: Effort normal and breath sounds normal. No respiratory distress. She has no decreased breath sounds. She has no wheezes. She has no rhonchi. She has no rales. She exhibits no tenderness.  Abdominal: Soft. There is no tenderness. There is no rebound.  Musculoskeletal: Normal range of motion. She exhibits no edema and no tenderness.  Neurological: She is alert.  Skin: No rash noted. No erythema. No pallor.    ED Course  Procedures (including critical care time)  Labs Reviewed - No data to display No results found.   No diagnosis found.    MDM  Patient symptoms and exam were consistent with a muscular skeletal source of pain. Versus neuropathic pain. Patient was instructed to observe for any changes on her skin in the affected area. We discuss symptoms and signs of herpes zoster. Patient denies any other symptoms this is chest pains, shortness of breath or recent trauma or injury. Patient has been instructed to take a course of Neurontin and to followup with her primary care doctor. For further evaluation if pain persisted. Patient was also otherwise about symptoms that would warrant further evaluation in the emergency department.       Jimmie Molly, MD 03/26/12 [phone]

## 2012-03-26 NOTE — ED Notes (Signed)
C/o sharp  Pain in lt mid back for 3 days.  States hurts worse with movement.  Tender to palpation.  Reports she has to lie on the lt side to get comfortable at night.  Denies strenuous activity or injury.

## 2012-04-19 ENCOUNTER — Emergency Department (HOSPITAL_COMMUNITY)
Admission: EM | Admit: 2012-04-19 | Discharge: 2012-04-20 | Disposition: A | Discharge status: Home / Self Care | Payer: PRIVATE HEALTH INSURANCE | Attending: Emergency Medicine | Admitting: Emergency Medicine

## 2012-04-19 ENCOUNTER — Encounter (HOSPITAL_COMMUNITY): Payer: Self-pay | Admitting: *Deleted

## 2012-04-19 ENCOUNTER — Emergency Department (HOSPITAL_COMMUNITY): Payer: PRIVATE HEALTH INSURANCE

## 2012-04-19 DIAGNOSIS — M546 Pain in thoracic spine: Secondary | ICD-10-CM | POA: Insufficient documentation

## 2012-04-19 DIAGNOSIS — Z79899 Other long term (current) drug therapy: Secondary | ICD-10-CM | POA: Insufficient documentation

## 2012-04-19 DIAGNOSIS — Z8673 Personal history of transient ischemic attack (TIA), and cerebral infarction without residual deficits: Secondary | ICD-10-CM | POA: Insufficient documentation

## 2012-04-19 DIAGNOSIS — I10 Essential (primary) hypertension: Secondary | ICD-10-CM | POA: Insufficient documentation

## 2012-04-19 DIAGNOSIS — R109 Unspecified abdominal pain: Secondary | ICD-10-CM | POA: Insufficient documentation

## 2012-04-19 DIAGNOSIS — M109 Gout, unspecified: Secondary | ICD-10-CM | POA: Insufficient documentation

## 2012-04-19 DIAGNOSIS — J45909 Unspecified asthma, uncomplicated: Secondary | ICD-10-CM | POA: Insufficient documentation

## 2012-04-19 DIAGNOSIS — R079 Chest pain, unspecified: Secondary | ICD-10-CM | POA: Insufficient documentation

## 2012-04-19 DIAGNOSIS — E119 Type 2 diabetes mellitus without complications: Secondary | ICD-10-CM | POA: Insufficient documentation

## 2012-04-19 LAB — COMPREHENSIVE METABOLIC PANEL
Albumin: 3.5 g/dL (ref 3.5–5.2)
BUN: 13 mg/dL (ref 6–23)
Creatinine, Ser: 0.65 mg/dL (ref 0.50–1.10)
GFR calc Af Amer: >90 mL/min (ref >90)
Total Bilirubin: 0.3 mg/dL (ref 0.3–1.2)
Total Protein: 7.4 g/dL (ref 6.0–8.3)

## 2012-04-19 LAB — LIPASE, BLOOD: Lipase: 15 U/L (ref 11–59)

## 2012-04-19 MED ORDER — ONDANSETRON HCL 4 MG/2ML IJ SOLN
4.0000 mg | Freq: Once | INTRAMUSCULAR | Status: AC
  Administered 2012-04-20: 4 mg via INTRAVENOUS
  Filled 2012-04-19: qty 2

## 2012-04-19 MED ORDER — HYDROMORPHONE HCL PF 1 MG/ML IJ SOLN
1.0000 mg | INTRAMUSCULAR | Status: DC | PRN
  Administered 2012-04-20 (×2): 1 mg via INTRAVENOUS
  Filled 2012-04-19 (×2): qty 1

## 2012-04-19 MED ORDER — SODIUM CHLORIDE 0.9 % IV SOLN
1000.0000 mL | INTRAVENOUS | Status: DC
  Administered 2012-04-20: 1000 mL via INTRAVENOUS

## 2012-04-19 NOTE — ED Notes (Signed)
Pt reports of right side pain close to axilla area.  the pain increases upon palpation. Pt reports a mass in that area but none was felt upon palpation.

## 2012-04-19 NOTE — ED Notes (Addendum)
Per EMS report, pt from home: c/o of pain in right flank area x 2 days.  Denies falling, chest pain.  Originally pain started on her left side about a week ago but then yesterday it went to right side and the pain has increased.  She has taken Vicodin with no relief. EMS noted pt to be bent over in pain at pt's home. Rates 10/10 and pain increases upon exertion. CBG 147

## 2012-04-19 NOTE — ED Notes (Signed)
ZOX:[id number]<AV> Expected date:<BR> Expected time:<BR> Means of arrival:<BR> Comments:<BR> Medic 22, Rm 8, 61 F, Flank Pain

## 2012-04-19 NOTE — ED Provider Notes (Signed)
History     CSN: [id number] Arrival date & time 04/19/12  2209 First MD Initiated Contact with Patient 04/19/12 2318      Chief Complaint  Patient presents with  . Flank Pain    HPI Pt started having pain in her right flank for the last two days.  Sharp, waxes and wanes, non radiating, severe at times but now nagging.  It increases with  Movement and turning.  No fevers.  Occsnl cough that increases the pain.  No abd pain.  No shortness of breath.  She did have pain on the left side a couple of weeks ago and was treated and released. Past Medical History  Diagnosis Date  . Hypertension   . Stroke   . Asthma   . Diabetes mellitus   . Gout     Past Surgical History  Procedure Date  . Angioplasty     x 2 times  . Hand surgery     Family History  Problem Relation Age of Onset  . Cancer Mother     breast/ had mastecomy  . Cancer Father     colon    History  Substance Use Topics  . Smoking status: Former Games developer  . Smokeless tobacco: Never Used  . Alcohol Use: 1.2 oz/week    2 Glasses of wine per week     socially    OB History    Grav Para Term Preterm Abortions TAB SAB Ect Mult Living   3 3 3       3       Review of Systems  All other systems reviewed and are negative.    Allergies  Review of patient's allergies indicates no known allergies.  Home Medications   Current Outpatient Rx  Name Route Sig Dispense Refill  . ALBUTEROL SULFATE HFA 108 (90 BASE) MCG/ACT IN AERS Inhalation Inhale 2 puffs into the lungs every 6 (six) hours as needed. For shortness of breath    . AMLODIPINE BESYLATE 10 MG PO TABS Oral Take 10 mg by mouth daily.      Marland Kitchen CETIRIZINE HCL 10 MG PO TABS Oral Take 10 mg by mouth daily.    Marland Kitchen CLONIDINE HCL 0.3 MG PO TABS Oral Take 0.3 mg by mouth 2 (two) times daily.    Marland Kitchen CLOPIDOGREL BISULFATE 75 MG PO TABS Oral Take 75 mg by mouth daily.      . COLCHICINE 0.6 MG PO TABS Oral Take 0.6 mg by mouth daily.      Marland Kitchen FLUTICASONE-SALMETEROL 500-50  MCG/DOSE IN AEPB Inhalation Inhale 1 puff into the lungs every 12 (twelve) hours.    . FUROSEMIDE 20 MG PO TABS Oral Take 20 mg by mouth 2 (two) times daily. Am/ noon     . HYDROCODONE-ACETAMINOPHEN 5-500 MG PO TABS Oral Take 1 tablet by mouth every 6 (six) hours as needed. For pain    . LISINOPRIL 10 MG PO TABS Oral Take 10 mg by mouth daily.      Marland Kitchen METFORMIN HCL 500 MG PO TABS Oral Take 250 mg by mouth 2 (two) times daily.     . MOMETASONE FUROATE 50 MCG/ACT NA SUSP Nasal Place 2 sprays into the nose as needed.      Marland Kitchen OMEPRAZOLE 20 MG PO CPDR Oral Take 20 mg by mouth daily.      . WARFARIN SODIUM 7.5 MG PO TABS Oral Take 7.5 mg by mouth daily.        BP 152/78  Pulse 83  Temp 98.9 F (37.2 C) (Oral)  Resp 20  SpO2 95%  Physical Exam  Nursing note and vitals reviewed. Constitutional: No distress.       obese  HENT:  Head: Normocephalic and atraumatic.  Right Ear: External ear normal.  Left Ear: External ear normal.  Eyes: Conjunctivae are normal. Right eye exhibits no discharge. Left eye exhibits no discharge. No scleral icterus.  Neck: Neck supple. No tracheal deviation present.  Cardiovascular: Normal rate, regular rhythm and intact distal pulses.   Pulmonary/Chest: Effort normal and breath sounds normal. No stridor. No respiratory distress. She has no wheezes. She has no rales. She exhibits tenderness.       Exquisite ttp right mid axillary region along ribs, no deformity or crepitus, no rash  Abdominal: Soft. Bowel sounds are normal. She exhibits no distension. There is no tenderness. There is no rebound and no guarding.  Musculoskeletal: She exhibits edema. She exhibits no tenderness.  Neurological: She is alert. She has normal strength. No sensory deficit. Cranial nerve deficit:  no gross defecits noted. She exhibits normal muscle tone. She displays no seizure activity. Coordination normal.  Skin: Skin is warm and dry. No rash noted.  Psychiatric: She has a normal mood and  affect.    ED Course  Procedures (including critical care time)  Rate: 59  Rhythm: normal sinus rhythm  QRS Axis: normal  Intervals: normal  ST/T Wave abnormalities: normal  Conduction Disutrbances:RBBB  Narrative Interpretation: RBBB absent on previous EKG  Old EKG Reviewed: as above  Labs Reviewed  CBC - Abnormal; Notable for the following:    WBC 12.5 (*)     All other components within normal limits  COMPREHENSIVE METABOLIC PANEL - Abnormal; Notable for the following:    Glucose, Bld 156 (*)     All other components within normal limits  URINALYSIS, ROUTINE W REFLEX MICROSCOPIC - Abnormal; Notable for the following:    Protein, ur 30 (*)     All other components within normal limits  URINE MICROSCOPIC-ADD ON - Abnormal; Notable for the following:    Squamous Epithelial / LPF FEW (*)     All other components within normal limits  PROTIME-INR  APTT  D-DIMER, QUANTITATIVE  LIPASE, BLOOD   Dg Chest 2 View  04/20/2012  *RADIOLOGY REPORT*  Clinical Data: Flank pain.  Chest pain.  CHEST - 2 VIEW  Comparison: 09/19/2009  Findings: Moderate cardiomegaly.  Vascular congestion.  No interstitial edema.  No  pneumothorax.  IMPRESSION: Cardiomegaly and vascular congestion.  Original Report Authenticated By: Donavan Burnet, M.D.      MDM  The patient's symptoms seem to be musculoskeletal in nature. She has no evidence of pneumonia. I doubt PE. She has no abdominal tenderness or urinary symptoms. Movement and palpation reproduces her pain. We'll discharge her home on medications for pain and a muscle relaxant. I've encouraged to follow up with her primary doctor for further evaluation      Celene Kras, MD 04/20/12 0104

## 2012-04-20 ENCOUNTER — Emergency Department (HOSPITAL_COMMUNITY): Payer: PRIVATE HEALTH INSURANCE

## 2012-04-20 ENCOUNTER — Inpatient Hospital Stay (HOSPITAL_COMMUNITY)
Admission: EM | Admit: 2012-04-20 | Discharge: 2012-04-25 | DRG: 194 | Disposition: A | Discharge status: Home / Self Care | Payer: PRIVATE HEALTH INSURANCE | Attending: Internal Medicine | Admitting: Internal Medicine

## 2012-04-20 ENCOUNTER — Encounter (HOSPITAL_COMMUNITY): Payer: Self-pay | Admitting: *Deleted

## 2012-04-20 DIAGNOSIS — Z8673 Personal history of transient ischemic attack (TIA), and cerebral infarction without residual deficits: Secondary | ICD-10-CM

## 2012-04-20 DIAGNOSIS — Z6841 Body Mass Index (BMI) 40.0 and over, adult: Secondary | ICD-10-CM

## 2012-04-20 DIAGNOSIS — R0781 Pleurodynia: Secondary | ICD-10-CM

## 2012-04-20 DIAGNOSIS — J45909 Unspecified asthma, uncomplicated: Secondary | ICD-10-CM

## 2012-04-20 DIAGNOSIS — J189 Pneumonia, unspecified organism: Principal | ICD-10-CM

## 2012-04-20 DIAGNOSIS — D259 Leiomyoma of uterus, unspecified: Secondary | ICD-10-CM

## 2012-04-20 DIAGNOSIS — D72829 Elevated white blood cell count, unspecified: Secondary | ICD-10-CM | POA: Diagnosis present

## 2012-04-20 DIAGNOSIS — R071 Chest pain on breathing: Secondary | ICD-10-CM | POA: Diagnosis present

## 2012-04-20 DIAGNOSIS — J90 Pleural effusion, not elsewhere classified: Secondary | ICD-10-CM

## 2012-04-20 DIAGNOSIS — E669 Obesity, unspecified: Secondary | ICD-10-CM

## 2012-04-20 DIAGNOSIS — Z7901 Long term (current) use of anticoagulants: Secondary | ICD-10-CM

## 2012-04-20 DIAGNOSIS — J301 Allergic rhinitis due to pollen: Secondary | ICD-10-CM

## 2012-04-20 DIAGNOSIS — Z79899 Other long term (current) drug therapy: Secondary | ICD-10-CM

## 2012-04-20 DIAGNOSIS — Z87891 Personal history of nicotine dependence: Secondary | ICD-10-CM

## 2012-04-20 DIAGNOSIS — R0902 Hypoxemia: Secondary | ICD-10-CM | POA: Diagnosis present

## 2012-04-20 DIAGNOSIS — I10 Essential (primary) hypertension: Secondary | ICD-10-CM

## 2012-04-20 DIAGNOSIS — B029 Zoster without complications: Secondary | ICD-10-CM | POA: Diagnosis present

## 2012-04-20 DIAGNOSIS — E119 Type 2 diabetes mellitus without complications: Secondary | ICD-10-CM

## 2012-04-20 DIAGNOSIS — M109 Gout, unspecified: Secondary | ICD-10-CM

## 2012-04-20 LAB — URINALYSIS, ROUTINE W REFLEX MICROSCOPIC
Bilirubin Urine: NEGATIVE
Hgb urine dipstick: NEGATIVE
Ketones, ur: NEGATIVE mg/dL
Nitrite: NEGATIVE
pH: 5.5 (ref 5.0–8.0)

## 2012-04-20 LAB — CBC
HCT: 40.3 % (ref 36.0–46.0)
MCHC: 34 g/dL (ref 30.0–36.0)
MCV: 85.6 fL (ref 78.0–100.0)
RDW: 13.9 % (ref 11.5–15.5)

## 2012-04-20 LAB — PROTIME-INR
INR: 1.17 (ref 0.00–1.49)
Prothrombin Time: 15.1 seconds (ref 11.6–15.2)

## 2012-04-20 LAB — CBC WITH DIFFERENTIAL/PLATELET
Eosinophils Absolute: 0.2 10*3/uL (ref 0.0–0.7)
Eosinophils Relative: 2 % (ref 0–5)
Hemoglobin: 14.2 g/dL (ref 12.0–15.0)
Lymphs Abs: 2.9 10*3/uL (ref 0.7–4.0)
MCH: 28.9 pg (ref 26.0–34.0)
MCV: 85.5 fL (ref 78.0–100.0)
Monocytes Relative: 10 % (ref 3–12)
Platelets: 311 10*3/uL (ref 150–400)
RBC: 4.91 MIL/uL (ref 3.87–5.11)

## 2012-04-20 LAB — BASIC METABOLIC PANEL
BUN: 9 mg/dL (ref 6–23)
CO2: 29 mEq/L (ref 19–32)
Calcium: 8.9 mg/dL (ref 8.4–10.5)
Glucose, Bld: 144 mg/dL — ABNORMAL HIGH (ref 70–99)
Sodium: 140 mEq/L (ref 135–145)

## 2012-04-20 MED ORDER — VALACYCLOVIR HCL 500 MG PO TABS
1000.0000 mg | ORAL_TABLET | Freq: Three times a day (TID) | ORAL | Status: DC
  Administered 2012-04-21 – 2012-04-24 (×11): 1000 mg via ORAL
  Filled 2012-04-20 (×14): qty 2

## 2012-04-20 MED ORDER — CLOPIDOGREL BISULFATE 75 MG PO TABS
75.0000 mg | ORAL_TABLET | Freq: Every day | ORAL | Status: DC
  Administered 2012-04-21 – 2012-04-24 (×4): 75 mg via ORAL
  Filled 2012-04-20 (×5): qty 1

## 2012-04-20 MED ORDER — SODIUM CHLORIDE 0.9 % IV SOLN: INTRAVENOUS | Status: DC

## 2012-04-20 MED ORDER — METFORMIN HCL 500 MG PO TABS
250.0000 mg | ORAL_TABLET | Freq: Two times a day (BID) | ORAL | Status: DC
  Administered 2012-04-21 – 2012-04-23 (×5): 250 mg via ORAL
  Filled 2012-04-20 (×9): qty 1

## 2012-04-20 MED ORDER — HYDROCODONE-ACETAMINOPHEN 5-500 MG PO TABS: 1.0000 | ORAL_TABLET | Freq: Four times a day (QID) | ORAL | Status: DC | PRN

## 2012-04-20 MED ORDER — WARFARIN SODIUM 7.5 MG PO TABS
7.5000 mg | ORAL_TABLET | Freq: Every day | ORAL | Status: DC
  Administered 2012-04-21 – 2012-04-22 (×3): 7.5 mg via ORAL
  Filled 2012-04-20 (×5): qty 1

## 2012-04-20 MED ORDER — LEVOFLOXACIN IN D5W 750 MG/150ML IV SOLN
750.0000 mg | Freq: Every day | INTRAVENOUS | Status: AC
  Administered 2012-04-21 – 2012-04-24 (×5): 750 mg via INTRAVENOUS
  Filled 2012-04-20 (×5): qty 150

## 2012-04-20 MED ORDER — INSULIN ASPART 100 UNIT/ML ~~LOC~~ SOLN: 0.0000 [IU] | Freq: Every day | SUBCUTANEOUS | Status: DC

## 2012-04-20 MED ORDER — CYCLOBENZAPRINE HCL 5 MG PO TABS
5.0000 mg | ORAL_TABLET | Freq: Three times a day (TID) | ORAL | Status: DC | PRN
  Administered 2012-04-21 (×2): 5 mg via ORAL
  Filled 2012-04-20 (×2): qty 1

## 2012-04-20 MED ORDER — SODIUM CHLORIDE 0.9 % IV SOLN
INTRAVENOUS | Status: DC
  Administered 2012-04-20: 23:00:00 via INTRAVENOUS

## 2012-04-20 MED ORDER — LORATADINE 10 MG PO TABS
10.0000 mg | ORAL_TABLET | Freq: Every day | ORAL | Status: DC
  Administered 2012-04-21 – 2012-04-25 (×5): 10 mg via ORAL
  Filled 2012-04-20 (×6): qty 1

## 2012-04-20 MED ORDER — ONDANSETRON HCL 4 MG/2ML IJ SOLN: 4.0000 mg | Freq: Three times a day (TID) | INTRAMUSCULAR | Status: DC | PRN

## 2012-04-20 MED ORDER — HYDROCODONE-ACETAMINOPHEN 5-325 MG PO TABS
1.0000 | ORAL_TABLET | ORAL | Status: DC | PRN
  Administered 2012-04-21 – 2012-04-25 (×12): 1 via ORAL
  Filled 2012-04-20 (×12): qty 1

## 2012-04-20 MED ORDER — COLCHICINE 0.6 MG PO TABS
0.6000 mg | ORAL_TABLET | Freq: Every day | ORAL | Status: DC
  Administered 2012-04-21 – 2012-04-25 (×5): 0.6 mg via ORAL
  Filled 2012-04-20 (×6): qty 1

## 2012-04-20 MED ORDER — ENOXAPARIN SODIUM 40 MG/0.4ML ~~LOC~~ SOLN
40.0000 mg | Freq: Every day | SUBCUTANEOUS | Status: DC
  Administered 2012-04-21 (×2): 40 mg via SUBCUTANEOUS
  Filled 2012-04-20 (×4): qty 0.4

## 2012-04-20 MED ORDER — CYCLOBENZAPRINE HCL 5 MG PO TABS
5.0000 mg | ORAL_TABLET | Freq: Three times a day (TID) | ORAL | Status: DC | PRN
Start: 2012-04-20 — End: 2012-04-20

## 2012-04-20 MED ORDER — AMLODIPINE BESYLATE 10 MG PO TABS
10.0000 mg | ORAL_TABLET | Freq: Every day | ORAL | Status: DC
  Administered 2012-04-21 – 2012-04-25 (×4): 10 mg via ORAL
  Filled 2012-04-20 (×6): qty 1

## 2012-04-20 MED ORDER — MORPHINE SULFATE 4 MG/ML IJ SOLN
4.0000 mg | Freq: Once | INTRAMUSCULAR | Status: AC
  Administered 2012-04-20: 4 mg via INTRAVENOUS
  Filled 2012-04-20: qty 1

## 2012-04-20 MED ORDER — ALBUTEROL SULFATE HFA 108 (90 BASE) MCG/ACT IN AERS
2.0000 | INHALATION_SPRAY | Freq: Four times a day (QID) | RESPIRATORY_TRACT | Status: DC | PRN
  Filled 2012-04-20: qty 6.7

## 2012-04-20 MED ORDER — DEXTROSE 5 % IV SOLN
1.0000 g | Freq: Once | INTRAVENOUS | Status: AC
  Administered 2012-04-20: 1 g via INTRAVENOUS
  Filled 2012-04-20: qty 10

## 2012-04-20 MED ORDER — AZITHROMYCIN 250 MG PO TABS
500.0000 mg | ORAL_TABLET | Freq: Once | ORAL | Status: AC
  Administered 2012-04-20: 500 mg via ORAL
  Filled 2012-04-20: qty 2

## 2012-04-20 MED ORDER — FLUTICASONE PROPIONATE 50 MCG/ACT NA SUSP
1.0000 | Freq: Every day | NASAL | Status: DC
  Administered 2012-04-21 – 2012-04-25 (×5): 1 via NASAL
  Filled 2012-04-20 (×2): qty 16

## 2012-04-20 MED ORDER — FUROSEMIDE 20 MG PO TABS
20.0000 mg | ORAL_TABLET | Freq: Two times a day (BID) | ORAL | Status: DC
  Administered 2012-04-21 – 2012-04-23 (×5): 20 mg via ORAL
  Filled 2012-04-20 (×9): qty 1

## 2012-04-20 MED ORDER — HYDROCODONE-ACETAMINOPHEN 5-325 MG PO TABS
2.0000 | ORAL_TABLET | Freq: Once | ORAL | Status: AC
  Administered 2012-04-20: 2 via ORAL
  Filled 2012-04-20: qty 2

## 2012-04-20 MED ORDER — PANTOPRAZOLE SODIUM 40 MG PO TBEC
40.0000 mg | DELAYED_RELEASE_TABLET | Freq: Every day | ORAL | Status: DC
  Administered 2012-04-21 – 2012-04-24 (×4): 40 mg via ORAL
  Filled 2012-04-20 (×6): qty 1

## 2012-04-20 MED ORDER — WARFARIN - PHARMACIST DOSING INPATIENT: Freq: Every day | Status: DC

## 2012-04-20 MED ORDER — FLUTICASONE-SALMETEROL 500-50 MCG/DOSE IN AEPB
1.0000 | INHALATION_SPRAY | Freq: Two times a day (BID) | RESPIRATORY_TRACT | Status: DC
  Administered 2012-04-21 – 2012-04-24 (×7): 1 via RESPIRATORY_TRACT
  Filled 2012-04-20: qty 14

## 2012-04-20 MED ORDER — LISINOPRIL 10 MG PO TABS
10.0000 mg | ORAL_TABLET | Freq: Every day | ORAL | Status: DC
  Administered 2012-04-21 – 2012-04-23 (×2): 10 mg via ORAL
  Filled 2012-04-20 (×4): qty 1

## 2012-04-20 MED ORDER — OXYCODONE-ACETAMINOPHEN 5-325 MG PO TABS: 1.0000 | ORAL_TABLET | Freq: Four times a day (QID) | ORAL | Status: DC | PRN

## 2012-04-20 MED ORDER — INSULIN ASPART 100 UNIT/ML ~~LOC~~ SOLN
0.0000 [IU] | Freq: Three times a day (TID) | SUBCUTANEOUS | Status: DC
  Administered 2012-04-21 (×2): 3 [IU] via SUBCUTANEOUS
  Administered 2012-04-21: 2 [IU] via SUBCUTANEOUS
  Administered 2012-04-22: 3 [IU] via SUBCUTANEOUS
  Administered 2012-04-22: 2 [IU] via SUBCUTANEOUS
  Administered 2012-04-22: 3 [IU] via SUBCUTANEOUS
  Administered 2012-04-23: 2 [IU] via SUBCUTANEOUS
  Administered 2012-04-23: 3 [IU] via SUBCUTANEOUS
  Administered 2012-04-23: 2 [IU] via SUBCUTANEOUS
  Administered 2012-04-24: 3 [IU] via SUBCUTANEOUS
  Administered 2012-04-24: 2 [IU] via SUBCUTANEOUS
  Administered 2012-04-24: 3 [IU] via SUBCUTANEOUS
  Administered 2012-04-25: 2 [IU] via SUBCUTANEOUS

## 2012-04-20 MED ORDER — CYCLOBENZAPRINE HCL 5 MG PO TABS: 5.0000 mg | ORAL_TABLET | Freq: Three times a day (TID) | ORAL | Status: AC | PRN

## 2012-04-20 MED ORDER — CLONIDINE HCL 0.3 MG PO TABS
0.3000 mg | ORAL_TABLET | Freq: Two times a day (BID) | ORAL | Status: DC
  Administered 2012-04-21 – 2012-04-25 (×9): 0.3 mg via ORAL
  Filled 2012-04-20 (×13): qty 1

## 2012-04-20 NOTE — ED Notes (Signed)
[facility] called for transport. Awaiting their arrival.

## 2012-04-20 NOTE — ED Notes (Signed)
Bed:WA21<BR> Expected date:<BR> Expected time:<BR> Means of arrival:<BR> Comments:<BR> ems

## 2012-04-20 NOTE — ED Notes (Signed)
Pt c/o upper right flank pain x3 days.  Pt has area of redness in right upper flank area.  Pt has good peripheral pulses in all 4 extremities.  Lung sounds are clear and pt has bowel sounds.  Pt's abdomen is soft and non-tender to palpation.  No edema noted to extremities.  Pt has been seen at Urgent Care and ED for pain.  Pt's pupils are fixed and sluggish.

## 2012-04-20 NOTE — ED Notes (Signed)
Will administer antibiotics once blood cultures are drawn.

## 2012-04-20 NOTE — H&P (Signed)
[facility] History and Physical  Teresa Kerr:[id number] DOB: 05-17-1951 DOA: 04/20/2012  Referring physician: Dr. Towanda Malkin PCP: No primary provider on file.   Chief Complaint: rib pain  HPI:  This 61 year old lady who presents to the emergency room with complaints of right-sided hip pain. She reports the onset of her symptoms occurring approximately 3 days ago she had developed a mild nonproductive cough, denies any fever. She feels her pain is worse on coughing. It also is worse with palpation. She's not had any sore throat or nasal congestion. She denies any sick contacts. She's had no nausea or vomiting, no lightheadedness, no diarrhea or abdominal pain, no dysuria. She was evaluated in the emergency room where she was reportedly found to be hypoxic with oxygen saturations in the high 80s per ED physician. X-rays revealed possible developing right lower lobe pneumonia. She's been referred for admission  Review of Systems:  Pertinent positives as per HPI, otherwise negative  Past Medical History  Diagnosis Date  . Hypertension   . Stroke   . Asthma   . Diabetes mellitus   . Gout    Past Surgical History  Procedure Date  . Angioplasty     x 2 times  . Hand surgery    Social History:  reports that she has quit smoking. She has never used smokeless tobacco. She reports that she drinks about 1.2 ounces of alcohol per week. She reports that she does not use illicit drugs. Lives at home independently  No Known Allergies  Family History  Problem Relation Age of Onset  . Cancer Mother     breast/ had mastecomy  . Cancer Father     colon   reviewed, as above  Prior to Admission medications   Medication Sig Start Date End Date Taking? Authorizing Provider  albuterol (PROVENTIL HFA;VENTOLIN HFA) 108 (90 BASE) MCG/ACT inhaler Inhale 2 puffs into the lungs every 6 (six) hours as needed. For shortness of breath   Yes Historical Provider, MD  amLODipine (NORVASC) 10  MG tablet Take 10 mg by mouth daily.     Yes Historical Provider, MD  cetirizine (ZYRTEC ALLERGY) 10 MG tablet Take 10 mg by mouth daily.   Yes Historical Provider, MD  cloNIDine (CATAPRES) 0.3 MG tablet Take 0.3 mg by mouth 2 (two) times daily.   Yes Historical Provider, MD  clopidogrel (PLAVIX) 75 MG tablet Take 75 mg by mouth daily.     Yes Historical Provider, MD  colchicine 0.6 MG tablet Take 0.6 mg by mouth daily.     Yes Historical Provider, MD  cyclobenzaprine (FLEXERIL) 5 MG tablet Take 1 tablet (5 mg total) by mouth 3 (three) times daily as needed for muscle spasms. 04/20/12 04/30/12 Yes Celene Kras, MD  Fluticasone-Salmeterol (ADVAIR) 500-50 MCG/DOSE AEPB Inhale 1 puff into the lungs every 12 (twelve) hours.   Yes Historical Provider, MD  furosemide (LASIX) 20 MG tablet Take 20 mg by mouth 2 (two) times daily. Am/ noon    Yes Historical Provider, MD  HYDROcodone-acetaminophen (VICODIN) 5-500 MG per tablet Take 1 tablet by mouth every 6 (six) hours as needed. For pain 04/20/12  Yes Celene Kras, MD  lisinopril (PRINIVIL,ZESTRIL) 10 MG tablet Take 10 mg by mouth daily.     Yes Historical Provider, MD  metFORMIN (GLUCOPHAGE) 500 MG tablet Take 250 mg by mouth 2 (two) times daily.    Yes Historical Provider, MD  mometasone (NASONEX) 50 MCG/ACT nasal spray Place 2 sprays into the  nose as needed.     Yes Historical Provider, MD  omeprazole (PRILOSEC) 20 MG capsule Take 20 mg by mouth daily.     Yes Historical Provider, MD  oxyCODONE-acetaminophen (PERCOCET/ROXICET) 5-325 MG per tablet Take 1-2 tablets by mouth every 6 (six) hours as needed for pain. 04/20/12 04/30/12 Yes Celene Kras, MD  warfarin (COUMADIN) 7.5 MG tablet Take 7.5 mg by mouth daily.     Yes Historical Provider, MD   Physical Exam: Filed Vitals:   04/20/12 1752 04/20/12 1915 04/20/12 1916  BP: 156/65 152/79 152/79  Pulse: 94 89 93  Temp: 98 F (36.7 C)    TempSrc: Oral    Resp: 14 12   SpO2: 93% 92% 90%     General:  No acute  distress laying in bed  Eyes: Pupils are equal round react to light  ENT: Mucous membranes are placed, no pharyngeal erythema  Neck: Supple  Cardiovascular: S1, S2, regular rate and rhythm  Respiratory: Crackles at the right base  Abdomen: Soft, obese, nontender, bowel sounds are active  Skin: Very tender in the lateral chest on the right side near her baseline no clear vesicular rash  Musculoskeletal: Deferred  Psychiatric: Normal affect, cooperative with exam  Neurologic: Grossly intact, nonfocal  Labs on Admission:  Basic Metabolic Panel:  Lab 04/20/12 1610 04/19/12 2325  NA 140 138  K 3.8 3.9  CL 102 101  CO2 29 25  GLUCOSE 144* 156*  BUN 9 13  CREATININE 0.73 0.65  CALCIUM 8.9 9.1  MG -- --  PHOS -- --   Liver Function Tests:  Lab 04/19/12 2325  AST 13  ALT 16  ALKPHOS 90  BILITOT 0.3  PROT 7.4  ALBUMIN 3.5    Lab 04/19/12 2325  LIPASE 15  AMYLASE --   No results found for this basename: AMMONIA:5 in the last 168 hours CBC:  Lab 04/20/12 1944 04/19/12 2325  WBC 12.8* 12.5*  NEUTROABS 8.5* --  HGB 14.2 13.7  HCT 42.0 40.3  MCV 85.5 85.6  PLT 311 283   Cardiac Enzymes: No results found for this basename: CKTOTAL:5,CKMB:5,CKMBINDEX:5,TROPONINI:5 in the last 168 hours  BNP (last 3 results) No results found for this basename: PROBNP:3 in the last 8760 hours CBG: No results found for this basename: GLUCAP:5 in the last 168 hours  Radiological Exams on Admission: Dg Chest 2 View  04/20/2012  *RADIOLOGY REPORT*  Clinical Data: Flank pain.  Chest pain.  CHEST - 2 VIEW  Comparison: 09/19/2009  Findings: Moderate cardiomegaly.  Vascular congestion.  No interstitial edema.  No  pneumothorax.  IMPRESSION: Cardiomegaly and vascular congestion.  Original Report Authenticated By: Donavan Burnet, M.D.   Dg Ribs Unilateral W/chest Right  04/20/2012  *RADIOLOGY REPORT*  Clinical Data: Right lateral rib pain.  Lower axillary pain for 3 days.  No known  trauma.  RIGHT RIBS AND CHEST - 3+ VIEW  Comparison: Chest x-rays in 28 13  Findings: The heart is enlarged.  No edema.  There is minimal left midlung zone atelectasis. No pneumothorax or acute fracture.  Of the right lateral base, there is minimal patchy density, raising the question of early infiltrate.  IMPRESSION:  1. No evidence for acute osseous abnormality. 2.  Question early infiltrate at the right lateral lung base.  Original Report Authenticated By: Patterson Hammersmith, M.D.     Assessment/Plan Principal Problem:  *Community acquired pneumonia Active Problems:  OBESITY  ASTHMA  Hypoxia  Shingles  Rib pain  Hypertension  History of stroke  Chronic anticoagulation  Diabetes mellitus   1. Pneumonia.  Will treat as community acquired pneumonia and start on levaquin.  Will repeat xrays in the morning.  She does have a mild leukocytosis, will monitor for any fever. Will encourage incentive spirometry. 2. Hypoxia.  Wean off supplemental oxygen 3. Rib pain.  Patient is very tender on palpation. Her xrays do not reveal any rib fractures.  I am concerned she may be developing shingles.  We will start empirically on valtrex 4. HTN.  Continue outpatient meds 5. Diabetes.  Start on sliding scale insulin 6. Asthma.  Continue outpatient inhalers  Code Status: full code Family Communication: discussed with patient Disposition Plan: Discharge home when improved, possibly in the next 24-48 hours  Time spent:  MEMON,JEHANZEB [facility] Pager [phone]  If 7PM-7AM, please contact night-coverage [email or web address] Password [facility] 04/20/2012, 10:32 PM

## 2012-04-20 NOTE — Progress Notes (Signed)
ANTICOAGULATION CONSULT NOTE - Initial Consult  Pharmacy Consult for warfarin Indication: CVA  No Known Allergies  Patient Measurements:   Heparin Dosing Weight:   Vital Signs: Temp: 98 F (36.7 C) (07/28 1752) Temp src: Oral (07/28 1752) BP: 152/79 mmHg (07/28 1916) Pulse Rate: 93  (07/28 1916)  Labs:  Basename 04/20/12 1944 04/19/12 2325  HGB 14.2 13.7  HCT 42.0 40.3  PLT 311 283  APTT 27 30  LABPROT 14.6 15.1  INR 1.12 1.17  HEPARINUNFRC -- --  CREATININE 0.73 0.65  CKTOTAL -- --  CKMB -- --  TROPONINI -- --    The CrCl is unknown because both a height and weight (above a minimum accepted value) are required for this calculation.   Medical History: Past Medical History  Diagnosis Date  . Hypertension   . Stroke   . Asthma   . Diabetes mellitus   . Gout     Medications:  Prescriptions prior to admission  Medication Sig Dispense Refill  . albuterol (PROVENTIL HFA;VENTOLIN HFA) 108 (90 BASE) MCG/ACT inhaler Inhale 2 puffs into the lungs every 6 (six) hours as needed. For shortness of breath      . amLODipine (NORVASC) 10 MG tablet Take 10 mg by mouth daily.        . cetirizine (ZYRTEC ALLERGY) 10 MG tablet Take 10 mg by mouth daily.      . cloNIDine (CATAPRES) 0.3 MG tablet Take 0.3 mg by mouth 2 (two) times daily.      . clopidogrel (PLAVIX) 75 MG tablet Take 75 mg by mouth daily.        . colchicine 0.6 MG tablet Take 0.6 mg by mouth daily.        . cyclobenzaprine (FLEXERIL) 5 MG tablet Take 1 tablet (5 mg total) by mouth 3 (three) times daily as needed for muscle spasms.  21 tablet  0  . Fluticasone-Salmeterol (ADVAIR) 500-50 MCG/DOSE AEPB Inhale 1 puff into the lungs every 12 (twelve) hours.      . furosemide (LASIX) 20 MG tablet Take 20 mg by mouth 2 (two) times daily. Am/ noon       . HYDROcodone-acetaminophen (VICODIN) 5-500 MG per tablet Take 1 tablet by mouth every 6 (six) hours as needed. For pain  30 tablet  0  . lisinopril (PRINIVIL,ZESTRIL) 10  MG tablet Take 10 mg by mouth daily.        . metFORMIN (GLUCOPHAGE) 500 MG tablet Take 250 mg by mouth 2 (two) times daily.       . mometasone (NASONEX) 50 MCG/ACT nasal spray Place 2 sprays into the nose as needed.        Marland Kitchen omeprazole (PRILOSEC) 20 MG capsule Take 20 mg by mouth daily.        Marland Kitchen oxyCODONE-acetaminophen (PERCOCET/ROXICET) 5-325 MG per tablet Take 1-2 tablets by mouth every 6 (six) hours as needed for pain.  20 tablet  0  . warfarin (COUMADIN) 7.5 MG tablet Take 7.5 mg by mouth daily.          Assessment: Patient with hx of CVA. INR < 2,   Goal of Therapy:  INR 2-3    Plan:  Warfarin 7.5mg  po at 1800, but first dose now. Daily INR.  Aleene Davidson Crowford 04/20/2012,11:53 PM

## 2012-04-20 NOTE — ED Notes (Signed)
Patient transported to X-ray 

## 2012-04-20 NOTE — ED Notes (Signed)
Spoke with EDP Nanavati regarding plan for pt's care.  Was told he'll enter orders shortly.

## 2012-04-20 NOTE — ED Notes (Addendum)
Per EMS:  Pt was seen here yesterday for right flank and back pain.  Pt received rx for percocet or vicodin and pt states these medications have not properly treated her pain, so she returned to ED.  Pt's vitals WNL on scene - 170/110, HR 100, 16 respers, 98% on RA.  Pt does not c/o urinary incontinence or numbness/tingling in lower extremities.

## 2012-04-20 NOTE — ED Notes (Signed)
Admitting MD in with pt    

## 2012-04-21 ENCOUNTER — Inpatient Hospital Stay (HOSPITAL_COMMUNITY): Payer: PRIVATE HEALTH INSURANCE

## 2012-04-21 LAB — CBC
Hemoglobin: 12.3 g/dL (ref 12.0–15.0)
MCHC: 32.5 g/dL (ref 30.0–36.0)
Platelets: 272 10*3/uL (ref 150–400)
RBC: 4.37 MIL/uL (ref 3.87–5.11)

## 2012-04-21 LAB — BASIC METABOLIC PANEL
CO2: 28 mEq/L (ref 19–32)
Calcium: 8.3 mg/dL — ABNORMAL LOW (ref 8.4–10.5)
GFR calc non Af Amer: 90 mL/min — ABNORMAL LOW (ref >90)
Glucose, Bld: 157 mg/dL — ABNORMAL HIGH (ref 70–99)
Potassium: 3.8 mEq/L (ref 3.5–5.1)
Sodium: 139 mEq/L (ref 135–145)

## 2012-04-21 LAB — EXPECTORATED SPUTUM ASSESSMENT W GRAM STAIN, RFLX TO RESP C

## 2012-04-21 LAB — PROTIME-INR
INR: 1.15 (ref 0.00–1.49)
Prothrombin Time: 14.9 seconds (ref 11.6–15.2)

## 2012-04-21 LAB — STREP PNEUMONIAE URINARY ANTIGEN: Strep Pneumo Urinary Antigen: POSITIVE — AB

## 2012-04-21 LAB — GLUCOSE, CAPILLARY
Glucose-Capillary: 172 mg/dL — ABNORMAL HIGH (ref 70–99)
Glucose-Capillary: 158 mg/dL — ABNORMAL HIGH (ref 70–99)

## 2012-04-21 LAB — HIV ANTIBODY (ROUTINE TESTING W REFLEX): HIV: NONREACTIVE

## 2012-04-21 MED ORDER — MORPHINE SULFATE 2 MG/ML IJ SOLN
2.0000 mg | Freq: Once | INTRAMUSCULAR | Status: AC
Start: 2012-04-21 — End: 2012-04-21
  Administered 2012-04-21: 2 mg via INTRAVENOUS
  Filled 2012-04-21: qty 1

## 2012-04-21 MED ORDER — HYDROCOD POLST-CHLORPHEN POLST 10-8 MG/5ML PO LQCR: 5.0000 mL | Freq: Two times a day (BID) | ORAL | Status: DC | PRN

## 2012-04-21 MED ORDER — METHOCARBAMOL 750 MG PO TABS
750.0000 mg | ORAL_TABLET | Freq: Three times a day (TID) | ORAL | Status: DC
  Administered 2012-04-21 – 2012-04-22 (×3): 750 mg via ORAL
  Filled 2012-04-21 (×6): qty 1

## 2012-04-21 NOTE — Evaluation (Signed)
Physical Therapy Evaluation Patient Details Name: Teresa Kerr MRN: [id number] DOB: 02-Apr-1951 Today's Date: 04/21/2012 Time: 4098-1191 PT Time Calculation (min): 22 min  PT Assessment / Plan / Recommendation Clinical Impression  Pt presents with CAP and R sided rib/side pain with history of CVA, DM, and HTN.  Pt declined ambulation due to pain, however agreed to sit on EOB and to get from bed to recliner.  Requires +2 assist for safety.  Did not use AD with transfer, however with ambulation, recommend bari RW.  Pt will benefit from skilled PT in acute venue to address defcits.  PT recommends short term SNF placement to increase pt safety.      PT Assessment  Patient needs continued PT services    Follow Up Recommendations  Skilled nursing facility    Barriers to Discharge Decreased caregiver support      Equipment Recommendations  Defer to next venue    Recommendations for Other Services     Frequency Min 3X/week    Precautions / Restrictions Precautions Precautions: Fall Restrictions Weight Bearing Restrictions: No   Pertinent Vitals/Pain 10/10 pain in R side, RN aware      Mobility  Bed Mobility Bed Mobility: Supine to Sit;Sitting - Scoot to Edge of Bed Supine to Sit: 1: +2 Total assist;HOB elevated;With rails Supine to Sit: Patient Percentage: 50% Sitting - Scoot to Edge of Bed: 4: Min assist;3: Mod assist Details for Bed Mobility Assistance: Assist for B LE's off of bed and for trunk to attain sitting position.  Cues for hand placement on bed to self assist trunk.   Transfers Transfers: Sit to Stand;Stand to Sit;Stand Pivot Transfers Sit to Stand: 1: +2 Total assist;From elevated surface;With upper extremity assist;From bed Sit to Stand: Patient Percentage: 60% Stand to Sit: 1: +2 Total assist;With upper extremity assist;With armrests;To chair/3-in-1 Stand to Sit: Patient Percentage: 60% Stand Pivot Transfers: 1: +2 Total assist Stand Pivot Transfers:  Patient Percentage: 60% Details for Transfer Assistance: Assist to rise and steady throughout mobility with cues for hand placement and safety when sitting/standing.  Cues for technique when performing stand pivot transfer.   Ambulation/Gait Ambulation/Gait Assistance: 1: +2 Total assist Ambulation/Gait: Patient Percentage: 60% Ambulation Distance (Feet): 3 Feet Assistive device: None Ambulation/Gait Assistance Details: Took some steps from bed to chair due to increased pain in R side.  More stand pivot transfer, therefore did not use AD, however for any future amb would recommend bari walker.  Stairs: No Wheelchair Mobility Wheelchair Mobility: No    Exercises     PT Diagnosis: Difficulty walking;Generalized weakness;Acute pain  PT Problem List: Decreased strength;Decreased balance;Decreased activity tolerance;Decreased mobility;Decreased knowledge of use of DME;Obesity PT Treatment Interventions: DME instruction;Gait training;Functional mobility training;Therapeutic activities;Therapeutic exercise;Balance training;Patient/family education   PT Goals Acute Rehab PT Goals PT Goal Formulation: With patient Time For Goal Achievement: 05/05/12 Potential to Achieve Goals: Fair Pt will go Supine/Side to Sit: with min assist PT Goal: Supine/Side to Sit - Progress: Goal set today Pt will go Sit to Supine/Side: with min assist PT Goal: Sit to Supine/Side - Progress: Goal set today Pt will go Sit to Stand: with supervision PT Goal: Sit to Stand - Progress: Goal set today Pt will go Stand to Sit: with supervision PT Goal: Stand to Sit - Progress: Goal set today Pt will Ambulate: 51 - 150 feet;with min assist;with least restrictive assistive device PT Goal: Ambulate - Progress: Goal set today  Visit Information  Last PT Received On: 04/21/12 Assistance Needed: +2  Subjective Data  Subjective: I just don't want to hurt but I'll try Patient Stated Goal: to get pain under control     Prior Functioning  Home Living Lives With: Alone Available Help at Discharge: Personal care attendant Type of Home: Apartment Home Layout: One level Bathroom Shower/Tub: Tub/shower unit Home Adaptive Equipment: Shower chair with back;Walker - rolling;Straight cane;Bedside commode/3-in-1 Prior Function Level of Independence: Needs assistance Needs Assistance: Bathing;Dressing;Meal Prep Bath: Moderate Dressing: Moderate Meal Prep: Moderate Able to Take Stairs?: No Driving: No Communication Communication: No difficulties    Cognition  Overall Cognitive Status: Appears within functional limits for tasks assessed/performed Arousal/Alertness: Awake/alert Orientation Level: Appears intact for tasks assessed Behavior During Session: [facility] for tasks performed    Extremity/Trunk Assessment Right Lower Extremity Assessment RLE ROM/Strength/Tone: Deficits RLE ROM/Strength/Tone Deficits: Grossly 3/5 per functional assessment.  RLE Coordination: WFL - gross motor Left Lower Extremity Assessment LLE ROM/Strength/Tone: Deficits LLE ROM/Strength/Tone Deficits: grossly 3/5 per functional assessment.  LLE Coordination: WFL - gross/fine motor Trunk Assessment Trunk Assessment: Normal   Balance    End of Session PT - End of Session Equipment Utilized During Treatment: Gait belt Activity Tolerance: Patient limited by pain;Patient limited by fatigue Patient left: in chair;with call bell/phone within reach;with family/visitor present Nurse Communication: Mobility status  GP     Page, Meribeth Mattes 04/21/2012, 2:24 PM

## 2012-04-21 NOTE — Progress Notes (Signed)
[facility] PROGRESS NOTE  Teresa Kerr ZOX:[id number] DOB: 1950/10/11 DOA: 04/20/2012 PCP: No primary provider on file.  Assessment/Plan: Principal Problem:  *Community acquired pneumonia Active Problems:  OBESITY  ASTHMA  Hypoxia  Shingles  Rib pain  Hypertension  History of stroke  Chronic anticoagulation  Diabetes mellitus   Community-acquired pneumonia -Hazy infiltrates in the rib x-rays, patient also have mild leukocytosis. -This will be treated as community-acquired pneumonia with Levaquin.  Right-sided chest wall pain -Patient is very tender on palpation, rib x-rays did not show any fractures. Likely muscle spasm. -Because of the laterality of the pain there was concern about shingles. -Patient does not have any type of rash, vesicles or redness.  Diabetes mellitus type 2 -Check hemoglobin A1c, carbohydrate modified diet. -Insulin sliding scale will be utilized.  Hypoxia -This is likely secondary of patient not taking deep breaths because of pain. -D. dimers were negative, so pulmonary embolism is ruled out.   Code Status: Full Family Communication: Disposition Plan: Hold when she is stable   Brief narrative: 61 year old female with past medical history of diabetes mellitus and hypertension came to the hospital because of right-sided chest wall pain. Patient has severe pain and tenderness to the point she was hypoxic when she was admitted to the hospital.  Consultants:  None  Procedures:  None  Antibiotics:  Levaquin  HPI/Subjective: Pain is still there, improved slightly since yesterday  Objective: Filed Vitals:   04/21/12 0050 04/21/12 0259 04/21/12 0616 04/21/12 0930  BP: 164/89 124/78 128/75   Pulse: 91 81 68   Temp: 98.4 F (36.9 C) 97.9 F (36.6 C) 98.3 F (36.8 C)   TempSrc: Oral Oral Oral   Resp: 18 20 18    Height: 5\' 5"  (1.651 m)     Weight: 124.966 kg (275 lb 8 oz)     SpO2: 91% 98% 98% 98%    Intake/Output  Summary (Last 24 hours) at 04/21/12 1316 Last data filed at 04/21/12 0548  Gross per 24 hour  Intake    400 ml  Output      0 ml  Net    400 ml    Exam:  General: Alert and awake, oriented x3, not in any acute distress. HEENT: anicteric sclera, pupils reactive to light and accommodation, EOMI CVS: S1-S2 clear, no murmur rubs or gallops Chest: clear to auscultation bilaterally, there is severe tenderness in the right posterior axillary line Abdomen: soft nontender, nondistended, normal bowel sounds, no organomegaly Extremities: no cyanosis, clubbing or edema noted bilaterally Neuro: Cranial nerves II-XII intact, no focal neurological deficits  Data Reviewed: Basic Metabolic Panel:  Lab 04/21/12 8119 04/20/12 1944 04/19/12 2325  NA 139 140 138  K 3.8 3.8 3.9  CL 103 102 101  CO2 28 29 25   GLUCOSE 157* 144* 156*  BUN 10 9 13   CREATININE 0.78 0.73 0.65  CALCIUM 8.3* 8.9 9.1  MG -- -- --  PHOS -- -- --   Liver Function Tests:  Lab 04/19/12 2325  AST 13  ALT 16  ALKPHOS 90  BILITOT 0.3  PROT 7.4  ALBUMIN 3.5    Lab 04/19/12 2325  LIPASE 15  AMYLASE --   No results found for this basename: AMMONIA:5 in the last 168 hours CBC:  Lab 04/21/12 0448 04/20/12 1944 04/19/12 2325  WBC 12.1* 12.8* 12.5*  NEUTROABS -- 8.5* --  HGB 12.3 14.2 13.7  HCT 37.9 42.0 40.3  MCV 86.7 85.5 85.6  PLT 272 311 283  Cardiac Enzymes: No results found for this basename: CKTOTAL:5,CKMB:5,CKMBINDEX:5,TROPONINI:5 in the last 168 hours BNP (last 3 results) No results found for this basename: PROBNP:3 in the last 8760 hours CBG:  Lab 04/21/12 1154 04/21/12 0743 04/21/12 0004  GLUCAP 172* 135* 139*    No results found for this or any previous visit (from the past 240 hour(s)).   Studies: Dg Chest 2 View  04/21/2012  *RADIOLOGY REPORT*  Clinical Data: Follow up pneumonia  CHEST - 2 VIEW  Comparison: 04/20/2012  Findings: Heart size is moderately enlarged.  No pleural effusion or  edema identified.  No airspace consolidation identified.  IMPRESSION:  1.  No acute cardiopulmonary abnormalities.  Original Report Authenticated By: Rosealee Albee, M.D.   Dg Chest 2 View  04/20/2012  *RADIOLOGY REPORT*  Clinical Data: Flank pain.  Chest pain.  CHEST - 2 VIEW  Comparison: 09/19/2009  Findings: Moderate cardiomegaly.  Vascular congestion.  No interstitial edema.  No  pneumothorax.  IMPRESSION: Cardiomegaly and vascular congestion.  Original Report Authenticated By: Donavan Burnet, M.D.   Dg Ribs Unilateral W/chest Right  04/20/2012  *RADIOLOGY REPORT*  Clinical Data: Right lateral rib pain.  Lower axillary pain for 3 days.  No known trauma.  RIGHT RIBS AND CHEST - 3+ VIEW  Comparison: Chest x-rays in 28 13  Findings: The heart is enlarged.  No edema.  There is minimal left midlung zone atelectasis. No pneumothorax or acute fracture.  Of the right lateral base, there is minimal patchy density, raising the question of early infiltrate.  IMPRESSION:  1. No evidence for acute osseous abnormality. 2.  Question early infiltrate at the right lateral lung base.  Original Report Authenticated By: Patterson Hammersmith, M.D.    Scheduled Meds:   . amLODipine  10 mg Oral Daily  . azithromycin  500 mg Oral Once  . cefTRIAXone (ROCEPHIN)  IV  1 g Intravenous Once  . cloNIDine  0.3 mg Oral BID  . clopidogrel  75 mg Oral Daily  . colchicine  0.6 mg Oral Daily  . enoxaparin (LOVENOX) injection  40 mg Subcutaneous QHS  . fluticasone  1 spray Each Nare Daily  . Fluticasone-Salmeterol  1 puff Inhalation Q12H  . furosemide  20 mg Oral BID  . HYDROcodone-acetaminophen  2 tablet Oral Once  . insulin aspart  0-15 Units Subcutaneous TID WC  . insulin aspart  0-5 Units Subcutaneous QHS  . levofloxacin (LEVAQUIN) IV  750 mg Intravenous QHS  . lisinopril  10 mg Oral Daily  . loratadine  10 mg Oral Daily  . metFORMIN  250 mg Oral BID WC  .  morphine injection  2 mg Intravenous Once  .  morphine  injection  4 mg Intravenous Once  . pantoprazole  40 mg Oral Q1200  . valACYclovir  1,000 mg Oral TID  . warfarin  7.5 mg Oral q1800  . Warfarin - Pharmacist Dosing Inpatient   Does not apply q1800  . DISCONTD: sodium chloride   Intravenous STAT   Continuous Infusions:   . sodium chloride      Principal Problem:  *Community acquired pneumonia Active Problems:  OBESITY  ASTHMA  Hypoxia  Shingles  Rib pain  Hypertension  History of stroke  Chronic anticoagulation  Diabetes mellitus    Time spent: 40 minutes    [facility] A  [facility] Pager [phone]. If 8PM-8AM, please contact night-coverage at [email or web address], password [facility] 04/21/2012, 1:16 PM  LOS: 1 day

## 2012-04-21 NOTE — ED Provider Notes (Addendum)
History     CSN: [id number]  Arrival date & time 04/20/12  1730   First MD Initiated Contact with Patient 04/20/12 1805      Chief Complaint  Patient presents with  . Flank Pain    right    (Consider location/radiation/quality/duration/timing/severity/associated sxs/prior treatment) HPI Comments: Pt comes in with cc of flank pain. Pt has hx of dm, asthma, htn. Pt reports having right sided back painm lower thorax, that is worse with inspiration. No hx of renal stone, no UTI like sx, no falls.  The history is provided by the patient.    Past Medical History  Diagnosis Date  . Hypertension   . Stroke   . Asthma   . Diabetes mellitus   . Gout     Past Surgical History  Procedure Date  . Angioplasty     x 2 times  . Hand surgery     Family History  Problem Relation Age of Onset  . Cancer Mother     breast/ had mastecomy  . Cancer Father     colon    History  Substance Use Topics  . Smoking status: Former Games developer  . Smokeless tobacco: Never Used  . Alcohol Use: 1.2 oz/week    2 Glasses of wine per week     socially    OB History    Grav Para Term Preterm Abortions TAB SAB Ect Mult Living   3 3 3       3       Review of Systems  HENT: Negative for neck pain.   Respiratory: Negative for shortness of breath.   Cardiovascular: Negative for chest pain.  Gastrointestinal: Negative for nausea, vomiting and abdominal pain.  Genitourinary: Negative for dysuria.  Neurological: Negative for headaches.    Allergies  Review of patient's allergies indicates no known allergies.  Home Medications  No current outpatient prescriptions on file.  BP 164/89  Pulse 91  Temp 98.4 F (36.9 C) (Oral)  Resp 18  Ht 5\' 5"  (1.651 m)  Wt 275 lb 8 oz (124.966 kg)  BMI 45.85 kg/m2  SpO2 91%  Physical Exam  Nursing note and vitals reviewed. Constitutional: She is oriented to person, place, and time. She appears well-developed and well-nourished.  HENT:  Head:  Normocephalic and atraumatic.  Eyes: EOM are normal. Pupils are equal, round, and reactive to light.  Neck: Neck supple.  Cardiovascular: Normal rate, regular rhythm and normal heart sounds.   No murmur heard. Pulmonary/Chest: Effort normal. No respiratory distress.       Reproducible thoracic pain - right posterior lower. Pt also has tenderness with inspiration.  Abdominal: Soft. She exhibits no distension. There is no tenderness. There is no rebound and no guarding.  Neurological: She is alert and oriented to person, place, and time.  Skin: Skin is warm and dry.    ED Course  Procedures (including critical care time)  Labs Reviewed  BASIC METABOLIC PANEL - Abnormal; Notable for the following:    Glucose, Bld 144 (*)     All other components within normal limits  CBC WITH DIFFERENTIAL - Abnormal; Notable for the following:    WBC 12.8 (*)     Neutro Abs 8.5 (*)     Monocytes Absolute 1.2 (*)     All other components within normal limits  GLUCOSE, CAPILLARY - Abnormal; Notable for the following:    Glucose-Capillary 139 (*)     All other components within normal limits  APTT  PROTIME-INR  CULTURE, BLOOD (ROUTINE X 2)  CULTURE, BLOOD (ROUTINE X 2)  HIV ANTIBODY (ROUTINE TESTING)  CULTURE, EXPECTORATED SPUTUM-ASSESSMENT  GRAM STAIN  LEGIONELLA ANTIGEN, URINE  STREP PNEUMONIAE URINARY ANTIGEN  CBC  BASIC METABOLIC PANEL  PROTIME-INR   Dg Chest 2 View  04/20/2012  *RADIOLOGY REPORT*  Clinical Data: Flank pain.  Chest pain.  CHEST - 2 VIEW  Comparison: 09/19/2009  Findings: Moderate cardiomegaly.  Vascular congestion.  No interstitial edema.  No  pneumothorax.  IMPRESSION: Cardiomegaly and vascular congestion.  Original Report Authenticated By: Donavan Burnet, M.D.   Dg Ribs Unilateral W/chest Right  04/20/2012  *RADIOLOGY REPORT*  Clinical Data: Right lateral rib pain.  Lower axillary pain for 3 days.  No known trauma.  RIGHT RIBS AND CHEST - 3+ VIEW  Comparison: Chest x-rays  in 28 13  Findings: The heart is enlarged.  No edema.  There is minimal left midlung zone atelectasis. No pneumothorax or acute fracture.  Of the right lateral base, there is minimal patchy density, raising the question of early infiltrate.  IMPRESSION:  1. No evidence for acute osseous abnormality. 2.  Question early infiltrate at the right lateral lung base.  Original Report Authenticated By: Patterson Hammersmith, M.D.     1. Community acquired pneumonia   2. Diabetes mellitus   3. Hypertension   4. Shingles       MDM  Pt comes in with cc of flank pain, right sided. The pain is actually in the lower thorax. The pain is worse with inspiration and with cough. No fevers. Concern for rib fractures and pneumonia, as the pain is reproducible.     Derwood Kaplan, MD 04/21/12 8295  Derwood Kaplan, MD 07/18/12 [phone]

## 2012-04-21 NOTE — Progress Notes (Signed)
Pt arrived on unit  With admit of PNA. Orders for Airborne precautions r/t reddened rash to epigastric/ back area. Attending confirmed airborne precaution needed to r/o shingles. Pt then transferred to 1439, to be placed in proper negative pressure room. Report given to receiving RN, Lesle Reek.

## 2012-04-21 NOTE — Progress Notes (Signed)
ANTICOAGULATION CONSULT NOTE - Follow Up Consult  Pharmacy Consult for Warfarin Indication: CVA  No Known Allergies  Patient Measurements: Height: 5\' 5"  (165.1 cm) Weight: 275 lb 8 oz (124.966 kg) IBW/kg (Calculated) : 57   Vital Signs: Temp: 98.3 F (36.8 C) (07/29 0616) Temp src: Oral (07/29 0616) BP: 128/75 mmHg (07/29 0616) Pulse Rate: 68  (07/29 0616)  Labs:  Basename 04/21/12 0448 04/20/12 1944 04/19/12 2325  HGB 12.3 14.2 --  HCT 37.9 42.0 40.3  PLT 272 311 283  APTT -- 27 30  LABPROT 14.9 14.6 15.1  INR 1.15 1.12 1.17  HEPARINUNFRC -- -- --  CREATININE 0.78 0.73 0.65  CKTOTAL -- -- --  CKMB -- -- --  TROPONINI -- -- --    Estimated Creatinine Clearance: 100.6 ml/min (by C-G formula based on Cr of 0.78).   Medications:  Scheduled:    . amLODipine  10 mg Oral Daily  . azithromycin  500 mg Oral Once  . cefTRIAXone (ROCEPHIN)  IV  1 g Intravenous Once  . cloNIDine  0.3 mg Oral BID  . clopidogrel  75 mg Oral Daily  . colchicine  0.6 mg Oral Daily  . enoxaparin (LOVENOX) injection  40 mg Subcutaneous QHS  . fluticasone  1 spray Each Nare Daily  . Fluticasone-Salmeterol  1 puff Inhalation Q12H  . furosemide  20 mg Oral BID  . HYDROcodone-acetaminophen  2 tablet Oral Once  . insulin aspart  0-15 Units Subcutaneous TID WC  . insulin aspart  0-5 Units Subcutaneous QHS  . levofloxacin (LEVAQUIN) IV  750 mg Intravenous QHS  . lisinopril  10 mg Oral Daily  . loratadine  10 mg Oral Daily  . metFORMIN  250 mg Oral BID WC  .  morphine injection  2 mg Intravenous Once  .  morphine injection  4 mg Intravenous Once  . pantoprazole  40 mg Oral Q1200  . valACYclovir  1,000 mg Oral TID  . warfarin  7.5 mg Oral q1800  . Warfarin - Pharmacist Dosing Inpatient   Does not apply q1800  . DISCONTD: sodium chloride   Intravenous STAT   Infusions:    . sodium chloride      Assessment: [name] admitted 04/20/12 with RLL CAP. On chronic warfarin for hx of stroke. Of  note, she reports that she drinks about 1.2 ounces of alcohol per week. Home warfarin dose reported as 7.5mg  PO daily, last taken 7/72/13 prior to admission (per opt report). INR was subtherapeutic on admission (1.17) and remains subtherapeutic this am (1.15) - question outpt compliance vs inadequate home warfarin dose? For now, will continue reported home dose of 7.5mg  PO daily to r/o compliance issues. Pt is currently on Lovenox 40mg  SQ daily.  Goal of Therapy:  INR 2-3    Plan:  1) Continue warfarin 7.5mg  PO q 18:00. 2) Watch daily INR trend  Darrol Kerr, PharmD Pager: [phone] 04/21/2012,8:59 AM

## 2012-04-21 NOTE — Clinical Documentation Improvement (Signed)
BMI DOCUMENTATION CLARIFICATION QUERY  THIS DOCUMENT IS NOT A PERMANENT PART OF THE MEDICAL RECORD  TO RESPOND TO THE THIS QUERY, FOLLOW THE INSTRUCTIONS BELOW:  1. If needed, update documentation for the patient's encounter via the notes activity.  2. Access this query again and click edit on the In [employer].  3. After updating, or not, click F2 to complete all highlighted (required) fields concerning your review. Select "additional documentation in the medical record" OR "no additional documentation provided".  4. Click Sign note button.  5. The deficiency will fall out of your In Basket *Please let us know if you are not able to complete this workflow by phone or e-mail (listed below).         04/21/12  Dear Dr. Lavera Guise Marton Redwood  In an effort to better capture your patient's severity of illness, reflect appropriate length of stay and utilization of resources, a review of the patient medical record has revealed the following indicators.    Based on your clinical judgment, please clarify and document in a progress note and/or discharge summary the clinical condition associated with the following supporting information:  In responding to this query please exercise your independent judgment.  The fact that a query is asked, does not imply that any particular answer is desired or expected. Based on your clinical judgment, please document in the progress notes and discharge summary if condition below provides greater specificity regarding the patient's height and weight: - Morbid Obesity, BMI Possible Clinical conditions  Morbid Obesity W/ BMI= 45.9  Other condition___________________  Cannot Clinically determine _____________  Risk Factors: PNA, Asthma,   Signs & Symptoms: Weight: 275 lbs   Height: 70ft 5in    BMI = 45.9  Diagnostics: Lab: Glucose 157  Treatment Nursing Note: Diet Carb Modified  Medications: Protonix        Reviewed: additional documentation  in the medical record  Thank You,  Andy Gauss RN  Clinical Documentation Specialist:  Pager [phone] E-mail [email or web address]  Health Information Management [place]

## 2012-04-22 DIAGNOSIS — R079 Chest pain, unspecified: Secondary | ICD-10-CM

## 2012-04-22 DIAGNOSIS — M109 Gout, unspecified: Secondary | ICD-10-CM

## 2012-04-22 LAB — GLUCOSE, CAPILLARY
Glucose-Capillary: 188 mg/dL — ABNORMAL HIGH (ref 70–99)
Glucose-Capillary: 129 mg/dL — ABNORMAL HIGH (ref 70–99)

## 2012-04-22 LAB — PROTIME-INR
INR: 1.27 (ref 0.00–1.49)
Prothrombin Time: 16.2 seconds — ABNORMAL HIGH (ref 11.6–15.2)

## 2012-04-22 LAB — LEGIONELLA ANTIGEN, URINE: Legionella Antigen, Urine: NEGATIVE

## 2012-04-22 LAB — HEMOGLOBIN A1C: Mean Plasma Glucose: 214 mg/dL — ABNORMAL HIGH (ref <117)

## 2012-04-22 MED ORDER — GUAIFENESIN ER 600 MG PO TB12
1200.0000 mg | ORAL_TABLET | Freq: Two times a day (BID) | ORAL | Status: DC
  Administered 2012-04-22 – 2012-04-25 (×7): 1200 mg via ORAL
  Filled 2012-04-22 (×8): qty 2

## 2012-04-22 MED ORDER — ENOXAPARIN SODIUM 60 MG/0.6ML ~~LOC~~ SOLN
60.0000 mg | SUBCUTANEOUS | Status: DC
  Administered 2012-04-22 – 2012-04-23 (×2): 60 mg via SUBCUTANEOUS
  Filled 2012-04-22 (×3): qty 0.6

## 2012-04-22 MED ORDER — METHOCARBAMOL 500 MG PO TABS
750.0000 mg | ORAL_TABLET | Freq: Three times a day (TID) | ORAL | Status: DC | PRN
  Administered 2012-04-22 – 2012-04-24 (×4): 750 mg via ORAL
  Filled 2012-04-22: qty 2
  Filled 2012-04-22: qty 1
  Filled 2012-04-22 (×2): qty 2
  Filled 2012-04-22: qty 1

## 2012-04-22 NOTE — Progress Notes (Signed)
ANTICOAGULATION CONSULT NOTE - Follow Up Consult  Pharmacy Consult for Warfarin Indication: CVA  No Known Allergies  Patient Measurements: Height: 5\' 5"  (165.1 cm) Weight: 275 lb 8 oz (124.966 kg) IBW/kg (Calculated) : 57   Vital Signs: Temp: 98.2 F (36.8 C) (07/30 1027) Temp src: Oral (07/30 1027) BP: 106/68 mmHg (07/30 1027) Pulse Rate: 90  (07/30 1027)  Labs:  Basename 04/22/12 0430 04/21/12 0448 04/20/12 1944 04/19/12 2325  HGB -- 12.3 14.2 --  HCT -- 37.9 42.0 40.3  PLT -- 272 311 283  APTT -- -- 27 30  LABPROT 16.2* 14.9 14.6 --  INR 1.27 1.15 1.12 --  HEPARINUNFRC -- -- -- --  CREATININE -- 0.78 0.73 0.65  CKTOTAL -- -- -- --  CKMB -- -- -- --  TROPONINI -- -- -- --    Estimated Creatinine Clearance: 100.6 ml/min (by C-G formula based on Cr of 0.78).   Medications:  Scheduled:     . amLODipine  10 mg Oral Daily  . cloNIDine  0.3 mg Oral BID  . clopidogrel  75 mg Oral Daily  . colchicine  0.6 mg Oral Daily  . enoxaparin (LOVENOX) injection  40 mg Subcutaneous QHS  . fluticasone  1 spray Each Nare Daily  . Fluticasone-Salmeterol  1 puff Inhalation Q12H  . furosemide  20 mg Oral BID  . insulin aspart  0-15 Units Subcutaneous TID WC  . insulin aspart  0-5 Units Subcutaneous QHS  . levofloxacin (LEVAQUIN) IV  750 mg Intravenous QHS  . lisinopril  10 mg Oral Daily  . loratadine  10 mg Oral Daily  . metFORMIN  250 mg Oral BID WC  . methocarbamol  750 mg Oral TID  . pantoprazole  40 mg Oral Q1200  . valACYclovir  1,000 mg Oral TID  . warfarin  7.5 mg Oral q1800  . Warfarin - Pharmacist Dosing Inpatient   Does not apply q1800   Infusions:     . DISCONTD: sodium chloride      Assessment: [name] admitted 04/20/12 with RLL CAP. On chronic warfarin for hx of stroke. Of note, she reports that she drinks about 1.2 ounces of alcohol per week. Home warfarin dose reported as 7.5mg  PO daily, last taken 04/19/12 prior to admission (per opt report). INR was  subtherapeutic on admission (1.17) and remains subtherapeutic this am put starting to trend up - question outpt compliance vs inadequate home warfarin dose? For now, will continue reported home dose of 7.5mg  PO daily to r/o compliance issues. Pt is currently on Lovenox 40mg  SQ daily. No bleeding reported in chart notes. Drug interaction with Levaquin continues.  Goal of Therapy:  INR 2-3    Plan:  1) Continue warfarin 7.5mg  PO q 18:00. 2) Watch daily INR trend 3) Will adjust Lovenox to 0.5mg /kg for DVT prophylaxis due to BMI>30 per P&T protocol.  Darrol Angel, PharmD Pager: [phone] 04/22/2012,10:55 AM

## 2012-04-22 NOTE — Progress Notes (Signed)
[facility] PROGRESS NOTE  Teresa Kerr:[id number] DOB: 1951/08/30 DOA: 04/20/2012 PCP: No primary provider on file.  Assessment/Plan: Principal Problem:  *Community acquired pneumonia Active Problems:  OBESITY  ASTHMA  Hypoxia  Shingles  Rib pain  Hypertension  History of stroke  Chronic anticoagulation  Diabetes mellitus   Community-acquired pneumonia -Hazy infiltrates in the rib x-rays, patient also have mild leukocytosis. -Urine test is positive for strep, patient already on Levaquin. -This will be treated as community-acquired pneumonia with Levaquin.  Right-sided chest wall pain -Patient is very tender on palpation, rib x-rays did not show any fractures. -Because of the laterality of the pain there was concern about shingles on admission. -I don't think she has shingles, there is no any type of rash, vesicles or redness. -This is might be pleuritic chest pain secondary to the pneumonia.  Diabetes mellitus type 2 -Check hemoglobin A1c, carbohydrate modified diet. -Insulin sliding scale will be utilized.  Hypoxia -This is likely secondary of patient not taking deep breaths because of pain. -D. dimers were negative, so pulmonary embolism is ruled out.   Code Status: Full Family Communication: Disposition Plan: Likely home in a.m. with home health services.   Brief narrative: 61 year old female with past medical history of diabetes mellitus and hypertension came to the hospital because of right-sided chest wall pain. Patient has severe pain and tenderness to the point she was hypoxic when she was admitted to the hospital.  Consultants:  None  Procedures:  None  Antibiotics:  Levaquin  HPI/Subjective: Pain is still there, improved slightly since yesterday  Objective: Filed Vitals:   04/21/12 2054 04/22/12 0544 04/22/12 0934 04/22/12 1027  BP: 118/76 100/66  106/68  Pulse: 93 88  90  Temp: 99.4 F (37.4 C) 98.9 F (37.2 C)  98.2 F  (36.8 C)  TempSrc: Oral Oral  Oral  Resp: 16 18  18   Height:      Weight:      SpO2: 92% 90% 92% 92%    Intake/Output Summary (Last 24 hours) at 04/22/12 1128 Last data filed at 04/22/12 0933  Gross per 24 hour  Intake    840 ml  Output    925 ml  Net    -85 ml    Exam:  General: Alert and awake, oriented x3, not in any acute distress. HEENT: anicteric sclera, pupils reactive to light and accommodation, EOMI CVS: S1-S2 clear, no murmur rubs or gallops Chest: clear to auscultation bilaterally, there is severe tenderness in the right posterior axillary line Abdomen: soft nontender, nondistended, normal bowel sounds, no organomegaly Extremities: no cyanosis, clubbing or edema noted bilaterally Neuro: Cranial nerves II-XII intact, no focal neurological deficits  Data Reviewed: Basic Metabolic Panel:  Lab 04/21/12 4401 04/20/12 1944 04/19/12 2325  NA 139 140 138  K 3.8 3.8 3.9  CL 103 102 101  CO2 28 29 25   GLUCOSE 157* 144* 156*  BUN 10 9 13   CREATININE 0.78 0.73 0.65  CALCIUM 8.3* 8.9 9.1  MG -- -- --  PHOS -- -- --   Liver Function Tests:  Lab 04/19/12 2325  AST 13  ALT 16  ALKPHOS 90  BILITOT 0.3  PROT 7.4  ALBUMIN 3.5    Lab 04/19/12 2325  LIPASE 15  AMYLASE --   No results found for this basename: AMMONIA:5 in the last 168 hours CBC:  Lab 04/21/12 0448 04/20/12 1944 04/19/12 2325  WBC 12.1* 12.8* 12.5*  NEUTROABS -- 8.5* --  HGB 12.3 14.2  13.7  HCT 37.9 42.0 40.3  MCV 86.7 85.5 85.6  PLT 272 311 283   Cardiac Enzymes: No results found for this basename: CKTOTAL:5,CKMB:5,CKMBINDEX:5,TROPONINI:5 in the last 168 hours BNP (last 3 results) No results found for this basename: PROBNP:3 in the last 8760 hours CBG:  Lab 04/22/12 0731 04/21/12 2053 04/21/12 1659 04/21/12 1154 04/21/12 0743  GLUCAP 156* 158* 152* 172* 135*    Recent Results (from the past 240 hour(s))  CULTURE, BLOOD (ROUTINE X 2)     Status: Normal (Preliminary result)    Collection Time   04/20/12 10:20 PM      Component Value Range Status Comment   Specimen Description BLOOD RIGHT ANTECUBITAL   Final    Special Requests BOTTLES DRAWN AEROBIC AND ANAEROBIC 3CC   Final    Culture  Setup Time 04/21/2012 01:40   Final    Culture     Final    Value:        BLOOD CULTURE RECEIVED NO GROWTH TO DATE CULTURE WILL BE HELD FOR 5 DAYS BEFORE ISSUING A FINAL NEGATIVE REPORT   Report Status PENDING   Incomplete   CULTURE, BLOOD (ROUTINE X 2)     Status: Normal (Preliminary result)   Collection Time   04/20/12 10:30 PM      Component Value Range Status Comment   Specimen Description BLOOD RIGHT ARM   Final    Special Requests BOTTLES DRAWN AEROBIC AND ANAEROBIC 5CC   Final    Culture  Setup Time 04/21/2012 01:40   Final    Culture     Final    Value:        BLOOD CULTURE RECEIVED NO GROWTH TO DATE CULTURE WILL BE HELD FOR 5 DAYS BEFORE ISSUING A FINAL NEGATIVE REPORT   Report Status PENDING   Incomplete   CULTURE, EXPECTORATED SPUTUM-ASSESSMENT     Status: Normal   Collection Time   04/21/12  1:46 PM      Component Value Range Status Comment   Specimen Description SPUTUM   Final    Special Requests NONE   Final    Sputum evaluation     Final    Value: THIS SPECIMEN IS ACCEPTABLE. RESPIRATORY CULTURE REPORT TO FOLLOW.   Report Status 04/21/2012 FINAL   Final   CULTURE, RESPIRATORY     Status: Normal (Preliminary result)   Collection Time   04/21/12  1:46 PM      Component Value Range Status Comment   Specimen Description SPUTUM   Final    Special Requests NONE   Final    Gram Stain     Final    Value: NO WBC SEEN     NO SQUAMOUS EPITHELIAL CELLS SEEN     NO ORGANISMS SEEN   Culture NO GROWTH   Final    Report Status PENDING   Incomplete      Studies: Dg Chest 2 View  04/21/2012  *RADIOLOGY REPORT*  Clinical Data: Follow up pneumonia  CHEST - 2 VIEW  Comparison: 04/20/2012  Findings: Heart size is moderately enlarged.  No pleural effusion or edema identified.   No airspace consolidation identified.  IMPRESSION:  1.  No acute cardiopulmonary abnormalities.  Original Report Authenticated By: Rosealee Albee, M.D.   Dg Chest 2 View  04/20/2012  *RADIOLOGY REPORT*  Clinical Data: Flank pain.  Chest pain.  CHEST - 2 VIEW  Comparison: 09/19/2009  Findings: Moderate cardiomegaly.  Vascular congestion.  No interstitial edema.  No  pneumothorax.  IMPRESSION: Cardiomegaly and vascular congestion.  Original Report Authenticated By: Donavan Burnet, M.D.   Dg Ribs Unilateral W/chest Right  04/20/2012  *RADIOLOGY REPORT*  Clinical Data: Right lateral rib pain.  Lower axillary pain for 3 days.  No known trauma.  RIGHT RIBS AND CHEST - 3+ VIEW  Comparison: Chest x-rays in 28 13  Findings: The heart is enlarged.  No edema.  There is minimal left midlung zone atelectasis. No pneumothorax or acute fracture.  Of the right lateral base, there is minimal patchy density, raising the question of early infiltrate.  IMPRESSION:  1. No evidence for acute osseous abnormality. 2.  Question early infiltrate at the right lateral lung base.  Original Report Authenticated By: Patterson Hammersmith, M.D.    Scheduled Meds:    . amLODipine  10 mg Oral Daily  . cloNIDine  0.3 mg Oral BID  . clopidogrel  75 mg Oral Daily  . colchicine  0.6 mg Oral Daily  . enoxaparin (LOVENOX) injection  60 mg Subcutaneous Q24H  . fluticasone  1 spray Each Nare Daily  . Fluticasone-Salmeterol  1 puff Inhalation Q12H  . furosemide  20 mg Oral BID  . insulin aspart  0-15 Units Subcutaneous TID WC  . insulin aspart  0-5 Units Subcutaneous QHS  . levofloxacin (LEVAQUIN) IV  750 mg Intravenous QHS  . lisinopril  10 mg Oral Daily  . loratadine  10 mg Oral Daily  . metFORMIN  250 mg Oral BID WC  . methocarbamol  750 mg Oral TID  . pantoprazole  40 mg Oral Q1200  . valACYclovir  1,000 mg Oral TID  . warfarin  7.5 mg Oral q1800  . Warfarin - Pharmacist Dosing Inpatient   Does not apply q1800  . DISCONTD:  enoxaparin (LOVENOX) injection  40 mg Subcutaneous QHS   Continuous Infusions:    . DISCONTD: sodium chloride      Principal Problem:  *Community acquired pneumonia Active Problems:  OBESITY  ASTHMA  Hypoxia  Shingles  Rib pain  Hypertension  History of stroke  Chronic anticoagulation  Diabetes mellitus    Time spent: 40 minutes    [facility] A  [facility] Pager [phone]. If 8PM-8AM, please contact night-coverage at [email or web address], password [facility] 04/22/2012, 11:28 AM  LOS: 2 days

## 2012-04-22 NOTE — Progress Notes (Signed)
Physical Therapy Treatment Patient Details Name: Teresa Kerr MRN: [id number] DOB: 1951/01/04 Today's Date: 04/22/2012 Time: 8657-8469 PT Time Calculation (min): 17 min  PT Assessment / Plan / Recommendation Comments on Treatment Session  Pt much improved with mobility since yesterdays session.  Agreed to ambulate in hallway (did at min assist +2 for safety) with only c/o pain in R side 8/10 that improved somewhat with ambulation.  Now feel that pt able to D/C home with HHPT.       Follow Up Recommendations  Home health PT    Barriers to Discharge        Equipment Recommendations  None recommended by PT    Recommendations for Other Services    Frequency Min 3X/week   Plan Discharge plan needs to be updated    Precautions / Restrictions Precautions Precautions: Fall Restrictions Weight Bearing Restrictions: No   Pertinent Vitals/Pain 8/10, RN aware    Mobility  Bed Mobility Bed Mobility: Not assessed (Pt was in recliner when PT arrived. ) Transfers Transfers: Sit to Stand;Stand to Sit;Stand Pivot Transfers Sit to Stand: 1: +2 Total assist;With armrests;From chair/3-in-1 Sit to Stand: Patient Percentage: 80% Stand to Sit: 1: +2 Total assist;With upper extremity assist;With armrests;To chair/3-in-1 Stand to Sit: Patient Percentage: 80% Details for Transfer Assistance: Some assist for safety and to steady with cues for hand placement and safety when sitting/standing.  Ambulation/Gait Ambulation/Gait Assistance: 1: +2 Total assist Ambulation/Gait: Patient Percentage: 80% Ambulation Distance (Feet): 140 Feet Assistive device: Rolling walker (bari walker) Ambulation/Gait Assistance Details: Cues for RW placement/positioning for safety and for upright posture.  Gait Pattern: Step-through pattern;Decreased stride length;Wide base of support;Trunk flexed Gait velocity: decreased    Exercises     PT Diagnosis:    PT Problem List:   PT Treatment Interventions:     PT  Goals Acute Rehab PT Goals PT Goal Formulation: With patient Time For Goal Achievement: 05/05/12 Potential to Achieve Goals: Good Pt will go Sit to Stand: with supervision PT Goal: Sit to Stand - Progress: Progressing toward goal Pt will go Stand to Sit: with supervision PT Goal: Stand to Sit - Progress: Progressing toward goal Pt will Ambulate: 51 - 150 feet;with min assist;with least restrictive assistive device PT Goal: Ambulate - Progress: Progressing toward goal  Visit Information  Last PT Received On: 04/22/12 Assistance Needed: +1    Subjective Data  Subjective: Its just the pain in my side that is limiting me.  Patient Stated Goal: to get pain under control   Cognition  Overall Cognitive Status: Appears within functional limits for tasks assessed/performed Arousal/Alertness: Awake/alert Orientation Level: Appears intact for tasks assessed Behavior During Session: [facility] for tasks performed    Balance     End of Session PT - End of Session Equipment Utilized During Treatment: Gait belt Activity Tolerance: Patient limited by pain Patient left: in chair;with call bell/phone within reach;with family/visitor present Nurse Communication: Mobility status   GP     Teresa Kerr, Teresa Kerr 04/22/2012, 11:13 AM

## 2012-04-23 ENCOUNTER — Inpatient Hospital Stay (HOSPITAL_COMMUNITY): Payer: PRIVATE HEALTH INSURANCE

## 2012-04-23 DIAGNOSIS — J301 Allergic rhinitis due to pollen: Secondary | ICD-10-CM

## 2012-04-23 LAB — PROTIME-INR: INR: 1.31 (ref 0.00–1.49)

## 2012-04-23 LAB — CBC
MCH: 29 pg (ref 26.0–34.0)
MCV: 86.8 fL (ref 78.0–100.0)
Platelets: 291 10*3/uL (ref 150–400)
RDW: 14 % (ref 11.5–15.5)
WBC: 11 10*3/uL — ABNORMAL HIGH (ref 4.0–10.5)

## 2012-04-23 LAB — BASIC METABOLIC PANEL
CO2: 26 mEq/L (ref 19–32)
Calcium: 8.8 mg/dL (ref 8.4–10.5)
Creatinine, Ser: 0.94 mg/dL (ref 0.50–1.10)
GFR calc non Af Amer: 65 mL/min — ABNORMAL LOW (ref >90)

## 2012-04-23 LAB — GLUCOSE, CAPILLARY
Glucose-Capillary: 147 mg/dL — ABNORMAL HIGH (ref 70–99)
Glucose-Capillary: 168 mg/dL — ABNORMAL HIGH (ref 70–99)

## 2012-04-23 MED ORDER — DIPHENHYDRAMINE HCL 25 MG PO CAPS
25.0000 mg | ORAL_CAPSULE | Freq: Four times a day (QID) | ORAL | Status: DC | PRN
  Administered 2012-04-25: 50 mg via ORAL
  Filled 2012-04-23: qty 2

## 2012-04-23 MED ORDER — ACETYLCYSTEINE 20 % IN SOLN
600.0000 mg | Freq: Two times a day (BID) | RESPIRATORY_TRACT | Status: DC
  Administered 2012-04-23: 600 mg via ORAL
  Filled 2012-04-23 (×7): qty 4

## 2012-04-23 MED ORDER — METFORMIN HCL 500 MG PO TABS
250.0000 mg | ORAL_TABLET | Freq: Two times a day (BID) | ORAL | Status: DC
  Filled 2012-04-23: qty 1

## 2012-04-23 MED ORDER — WARFARIN SODIUM 10 MG PO TABS
10.0000 mg | ORAL_TABLET | Freq: Once | ORAL | Status: AC
  Administered 2012-04-23: 10 mg via ORAL
  Filled 2012-04-23: qty 1

## 2012-04-23 MED ORDER — SODIUM CHLORIDE 0.9 % IV SOLN
INTRAVENOUS | Status: AC
  Administered 2012-04-23 (×2): via INTRAVENOUS

## 2012-04-23 MED ORDER — FUROSEMIDE 20 MG PO TABS
20.0000 mg | ORAL_TABLET | Freq: Two times a day (BID) | ORAL | Status: DC
  Administered 2012-04-24 – 2012-04-25 (×3): 20 mg via ORAL
  Filled 2012-04-23 (×5): qty 1

## 2012-04-23 MED ORDER — LISINOPRIL 10 MG PO TABS: 10.0000 mg | ORAL_TABLET | Freq: Every day | ORAL | Status: DC

## 2012-04-23 MED ORDER — HYDROCORTISONE 1 % EX CREA
TOPICAL_CREAM | Freq: Three times a day (TID) | CUTANEOUS | Status: DC
  Administered 2012-04-23 – 2012-04-24 (×3): via TOPICAL
  Filled 2012-04-23: qty 28

## 2012-04-23 NOTE — Progress Notes (Signed)
Paged Md to advise unable to insert 20G needed for test CT Angio Chest PE W/Cm &/Or Wo Cm ([id number]) on 04/23/12.  IV team paged and also unsuccessful inserting IV.

## 2012-04-23 NOTE — Progress Notes (Addendum)
ANTICOAGULATION CONSULT NOTE - Follow Up Consult  Pharmacy Consult for Warfarin Indication: CVA  No Known Allergies  Patient Measurements: Height: 5\' 5"  (165.1 cm) Weight: 275 lb 8 oz (124.966 kg) IBW/kg (Calculated) : 57   Vital Signs: Temp: 98 F (36.7 C) (07/31 0456) Temp src: Oral (07/31 0456) BP: 123/83 mmHg (07/31 0456) Pulse Rate: 86  (07/31 0456)  Labs:  Basename 04/23/12 0431 04/22/12 0430 04/21/12 0448 04/20/12 1944  HGB 13.0 -- 12.3 --  HCT 38.9 -- 37.9 42.0  PLT 291 -- 272 311  APTT -- -- -- 27  LABPROT 16.5* 16.2* 14.9 --  INR 1.31 1.27 1.15 --  HEPARINUNFRC -- -- -- --  CREATININE 0.94 -- 0.78 0.73  CKTOTAL -- -- -- --  CKMB -- -- -- --  TROPONINI -- -- -- --    Estimated Creatinine Clearance: 85.7 ml/min (by C-G formula based on Cr of 0.94).   Medications:  Scheduled:     . amLODipine  10 mg Oral Daily  . cloNIDine  0.3 mg Oral BID  . clopidogrel  75 mg Oral Daily  . colchicine  0.6 mg Oral Daily  . enoxaparin (LOVENOX) injection  60 mg Subcutaneous Q24H  . fluticasone  1 spray Each Nare Daily  . Fluticasone-Salmeterol  1 puff Inhalation Q12H  . furosemide  20 mg Oral BID  . guaiFENesin  1,200 mg Oral BID  . insulin aspart  0-15 Units Subcutaneous TID WC  . insulin aspart  0-5 Units Subcutaneous QHS  . levofloxacin (LEVAQUIN) IV  750 mg Intravenous QHS  . lisinopril  10 mg Oral Daily  . loratadine  10 mg Oral Daily  . metFORMIN  250 mg Oral BID WC  . pantoprazole  40 mg Oral Q1200  . valACYclovir  1,000 mg Oral TID  . warfarin  7.5 mg Oral q1800  . Warfarin - Pharmacist Dosing Inpatient   Does not apply q1800  . DISCONTD: enoxaparin (LOVENOX) injection  40 mg Subcutaneous QHS  . DISCONTD: methocarbamol  750 mg Oral TID   Infusions:     Assessment: [name] admitted 04/20/12 with RLL CAP. On chronic warfarin for hx of stroke (also on Plavix). Of note, she reports that she drinks about 1.2 ounces of alcohol per week. Home warfarin dose  reported as 7.5mg  PO daily, last taken 04/19/12 prior to admission (per opt report). INR was subtherapeutic on admission (1.17) and remains subtherapeutic this am but starting to trend up - question outpt compliance vs inadequate home warfarin dose? Was continuing on reported home dose of 7.5mg  PO daily to r/o compliance issues. INR mildly increasing following 7.5 mg doses x 3 days so will increase dose tonight.  Prophylactic Lovenox adjusted to 60mg  (0.5mg /kg) SQ daily for BMI>30. Renal function stable. CBC stable. No bleeding reported in chart notes. Drug interaction with Levaquin continues.    Goal of Therapy:  INR 2-3    Plan:  1) Warfarin 10 mg PO tonight. 2) Watch daily INR trend  Clance Boll, PharmD, BCPS Pager: [phone] 04/23/2012 9:47 AM  Addendum:  MD adding full dose Lovenox until INR = 2.  Plan:   Lovenox 125mg  SQ q12h.  F/U INR and D/C Lovenox when INR = 2.  Lorenza Evangelist 04/24/2012 5:14 AM

## 2012-04-23 NOTE — Progress Notes (Addendum)
[facility]                                                                                Patient Demographics  Teresa Kerr, is a 61 y.o. female  UJW:[id number]  [id number]  DOB - Apr 29, 1951  Admit date - 04/20/2012  Admitting Physician Sorin Luanne Bras, MD  Outpatient Primary MD for the patient is No primary provider on file.  LOS - 3   Chief Complaint  Patient presents with  . Flank Pain    right        Assessment & Plan    Right-sided pleuritic chest pain, cough, tachycardia with mild shortness of breath likely due to Community-acquired pneumonia  -Hazy infiltrates in the rib x-rays, patient also have mild leukocytosis currently has community-acquired pneumonia for which Levaquin will be continued. -Urine test is positive for strep, continue when necessary nebulizer treatments and oxygen as needed. -This will be treated as community-acquired pneumonia with Levaquin.   However her pleuritic right-sided chest pain is actually getting worse, question if she is developing a para pneumonic effusion versus clot, we'll get a CTA today. We'll give her gentle IV fluids and Mucomyst, skip her next dose of Lasix and ACE, No rash noted on the chest pain site.   .  Diabetes mellitus type 2   - hemoglobin A1c, carbohydrate modified diet.  -Insulin sliding scale will be utilized.   Lab Results  Component Value Date   HGBA1C 9.1* 04/22/2012     CBG (last 3)   Basename 04/23/12 1211 04/23/12 0733 04/22/12 2157  GLUCAP 149* 147* 155*      Patient noted to be on chronic Coumadin  pharmacy to dose once INR is above 2 Lovenox will be stopped as patient is chronically on Coumadin.     Code Status: Full  Family Communication: Discussed with the  Disposition Plan: Home    Procedures CTA chest   Antibiotics  Levaquin   Time Spent in minutes   30   DVT Prophylaxis  Lovenox    Leroy Sea M.D on 04/23/2012 at 12:34 PM  Between 7am  to 7pm - Pager - [phone]  After 7pm go to [email or web address] - password [email or web address]  And look for the night coverage person covering for me after hours  [facility] Office  [phone]    Subjective:   Teresa Kerr today has, No headache, still has sharp right-sided pleuritic chest pain, No abdominal pain - No Nausea, No new weakness tingling or numbness, productive Cough - mild SOB.   Objective:   Filed Vitals:   04/22/12 1700 04/22/12 2129 04/23/12 0456 04/23/12 0758  BP: 123/80 135/81 123/83   Pulse: 94 95 86   Temp:  97.8 F (36.6 C) 98 F (36.7 C)   TempSrc:  Oral Oral   Resp: 18 18 18    Height:      Weight:      SpO2: 94% 95% 92% 93%    Wt Readings from Last 3 Encounters:  04/21/12 124.966 kg (275 lb 8 oz)  07/27/11 124.467 kg (274 lb 6.4 oz)     Intake/Output Summary (Last 24 hours) at 04/23/12 1234 Last data filed  at 04/23/12 1040  Gross per 24 hour  Intake    390 ml  Output    300 ml  Net     90 ml    Exam Awake Alert, Oriented X 3, No new F.N deficits, Normal affect [place].AT,PERRAL Supple Neck,No JVD, No cervical lymphadenopathy appriciated.  Symmetrical Chest wall movement, Good air movement bilaterally, CTAB RRR,No Gallops,Rubs or new Murmurs, No Parasternal Heave +ve B.Sounds, Abd Soft, Non tender, No organomegaly appriciated, No rebound - guarding or rigidity. No Cyanosis, Clubbing or edema, No new Rash or bruise    Data Review   CBC  Lab 04/23/12 0431 04/21/12 0448 04/20/12 1944 04/19/12 2325  WBC 11.0* 12.1* 12.8* 12.5*  HGB 13.0 12.3 14.2 13.7  HCT 38.9 37.9 42.0 40.3  PLT 291 272 311 283  MCV 86.8 86.7 85.5 85.6  MCH 29.0 28.1 28.9 29.1  MCHC 33.4 32.5 33.8 34.0  RDW 14.0 13.7 13.5 13.9  LYMPHSABS -- -- 2.9 --  MONOABS -- -- 1.2* --  EOSABS -- -- 0.2 --  BASOSABS -- -- 0.0 --  BANDABS -- -- -- --    Chemistries   Lab 04/23/12 0431 04/21/12 0448 04/20/12 1944 04/19/12 2325  NA 139 139 140 138  K 3.9 3.8 3.8 3.9    CL 103 103 102 101  CO2 26 28 29 25   GLUCOSE 156* 157* 144* 156*  BUN 19 10 9 13   CREATININE 0.94 0.78 0.73 0.65  CALCIUM 8.8 8.3* 8.9 9.1  MG -- -- -- --  AST -- -- -- 13  ALT -- -- -- 16  ALKPHOS -- -- -- 90  BILITOT -- -- -- 0.3   ------------------------------------------------------------------------------------------------------------------ estimated creatinine clearance is 85.7 ml/min (by C-G formula based on Cr of 0.94). ------------------------------------------------------------------------------------------------------------------  Basename 04/22/12 0430  HGBA1C 9.1*   ------------------------------------------------------------------------------------------------------------------ No results found for this basename: CHOL:2,HDL:2,LDLCALC:2,TRIG:2,CHOLHDL:2,LDLDIRECT:2 in the last 72 hours ------------------------------------------------------------------------------------------------------------------ No results found for this basename: TSH,T4TOTAL,FREET3,T3FREE,THYROIDAB in the last 72 hours ------------------------------------------------------------------------------------------------------------------ No results found for this basename: VITAMINB12:2,FOLATE:2,FERRITIN:2,TIBC:2,IRON:2,RETICCTPCT:2 in the last 72 hours  Coagulation profile  Lab 04/23/12 0431 04/22/12 0430 04/21/12 0448 04/20/12 1944 04/19/12 2325  INR 1.31 1.27 1.15 1.12 1.17  PROTIME -- -- -- -- --    No results found for this basename: DDIMER:2 in the last 72 hours  Cardiac Enzymes No results found for this basename: CK:3,CKMB:3,TROPONINI:3,MYOGLOBIN:3 in the last 168 hours ------------------------------------------------------------------------------------------------------------------ No components found with this basename: POCBNP:3  Micro Results Recent Results (from the past 240 hour(s))  CULTURE, BLOOD (ROUTINE X 2)     Status: Normal (Preliminary result)   Collection Time   04/20/12  10:20 PM      Component Value Range Status Comment   Specimen Description BLOOD RIGHT ANTECUBITAL   Final    Special Requests BOTTLES DRAWN AEROBIC AND ANAEROBIC 3CC   Final    Culture  Setup Time 04/21/2012 01:40   Final    Culture     Final    Value:        BLOOD CULTURE RECEIVED NO GROWTH TO DATE CULTURE WILL BE HELD FOR 5 DAYS BEFORE ISSUING A FINAL NEGATIVE REPORT   Report Status PENDING   Incomplete   CULTURE, BLOOD (ROUTINE X 2)     Status: Normal (Preliminary result)   Collection Time   04/20/12 10:30 PM      Component Value Range Status Comment   Specimen Description BLOOD RIGHT ARM   Final    Special Requests BOTTLES DRAWN AEROBIC  AND ANAEROBIC 5CC   Final    Culture  Setup Time 04/21/2012 01:40   Final    Culture     Final    Value:        BLOOD CULTURE RECEIVED NO GROWTH TO DATE CULTURE WILL BE HELD FOR 5 DAYS BEFORE ISSUING A FINAL NEGATIVE REPORT   Report Status PENDING   Incomplete   CULTURE, EXPECTORATED SPUTUM-ASSESSMENT     Status: Normal   Collection Time   04/21/12  1:46 PM      Component Value Range Status Comment   Specimen Description SPUTUM   Final    Special Requests NONE   Final    Sputum evaluation     Final    Value: THIS SPECIMEN IS ACCEPTABLE. RESPIRATORY CULTURE REPORT TO FOLLOW.   Report Status 04/21/2012 FINAL   Final   CULTURE, RESPIRATORY     Status: Normal (Preliminary result)   Collection Time   04/21/12  1:46 PM      Component Value Range Status Comment   Specimen Description SPUTUM   Final    Special Requests NONE   Final    Gram Stain     Final    Value: NO WBC SEEN     NO SQUAMOUS EPITHELIAL CELLS SEEN     NO ORGANISMS SEEN   Culture Culture reincubated for better growth   Final    Report Status PENDING   Incomplete     Radiology Reports Dg Chest 2 View  04/21/2012  *RADIOLOGY REPORT*  Clinical Data: Follow up pneumonia  CHEST - 2 VIEW  Comparison: 04/20/2012  Findings: Heart size is moderately enlarged.  No pleural effusion or edema  identified.  No airspace consolidation identified.  IMPRESSION:  1.  No acute cardiopulmonary abnormalities.  Original Report Authenticated By: Rosealee Albee, M.D.   Dg Chest 2 View  04/20/2012  *RADIOLOGY REPORT*  Clinical Data: Flank pain.  Chest pain.  CHEST - 2 VIEW  Comparison: 09/19/2009  Findings: Moderate cardiomegaly.  Vascular congestion.  No interstitial edema.  No  pneumothorax.  IMPRESSION: Cardiomegaly and vascular congestion.  Original Report Authenticated By: Donavan Burnet, M.D.   Dg Ribs Unilateral W/chest Right  04/20/2012  *RADIOLOGY REPORT*  Clinical Data: Right lateral rib pain.  Lower axillary pain for 3 days.  No known trauma.  RIGHT RIBS AND CHEST - 3+ VIEW  Comparison: Chest x-rays in 28 13  Findings: The heart is enlarged.  No edema.  There is minimal left midlung zone atelectasis. No pneumothorax or acute fracture.  Of the right lateral base, there is minimal patchy density, raising the question of early infiltrate.  IMPRESSION:  1. No evidence for acute osseous abnormality. 2.  Question early infiltrate at the right lateral lung base.  Original Report Authenticated By: Patterson Hammersmith, M.D.    Scheduled Meds:    . acetylcysteine  600 mg Oral BID  . amLODipine  10 mg Oral Daily  . cloNIDine  0.3 mg Oral BID  . clopidogrel  75 mg Oral Daily  . colchicine  0.6 mg Oral Daily  . enoxaparin (LOVENOX) injection  60 mg Subcutaneous Q24H  . fluticasone  1 spray Each Nare Daily  . Fluticasone-Salmeterol  1 puff Inhalation Q12H  . furosemide  20 mg Oral BID  . guaiFENesin  1,200 mg Oral BID  . insulin aspart  0-15 Units Subcutaneous TID WC  . insulin aspart  0-5 Units Subcutaneous QHS  . levofloxacin (LEVAQUIN) IV  750 mg Intravenous QHS  . lisinopril  10 mg Oral Daily  . loratadine  10 mg Oral Daily  . metFORMIN  250 mg Oral BID WC  . pantoprazole  40 mg Oral Q1200  . valACYclovir  1,000 mg Oral TID  . warfarin  10 mg Oral ONCE-1800  . Warfarin - Pharmacist  Dosing Inpatient   Does not apply q1800  . DISCONTD: furosemide  20 mg Oral BID  . DISCONTD: lisinopril  10 mg Oral Daily  . DISCONTD: metFORMIN  250 mg Oral BID WC  . DISCONTD: warfarin  7.5 mg Oral q1800   Continuous Infusions:    . sodium chloride 75 mL/hr at 04/23/12 1233   PRN Meds:.albuterol, chlorpheniramine-HYDROcodone, HYDROcodone-acetaminophen, methocarbamol

## 2012-04-23 NOTE — Progress Notes (Signed)
Per Dr. Thedore Mins, continue Valtrex until tomorrow, if no rash present, discontinue.

## 2012-04-24 ENCOUNTER — Inpatient Hospital Stay (HOSPITAL_COMMUNITY): Payer: PRIVATE HEALTH INSURANCE

## 2012-04-24 DIAGNOSIS — J90 Pleural effusion, not elsewhere classified: Secondary | ICD-10-CM

## 2012-04-24 DIAGNOSIS — M7989 Other specified soft tissue disorders: Secondary | ICD-10-CM

## 2012-04-24 LAB — BASIC METABOLIC PANEL
BUN: 20 mg/dL (ref 6–23)
CO2: 25 mEq/L (ref 19–32)
Calcium: 8.8 mg/dL (ref 8.4–10.5)
Chloride: 104 mEq/L (ref 96–112)
Creatinine, Ser: 0.9 mg/dL (ref 0.50–1.10)

## 2012-04-24 LAB — PROTIME-INR
INR: 1.35 (ref 0.00–1.49)
Prothrombin Time: 16.9 seconds — ABNORMAL HIGH (ref 11.6–15.2)

## 2012-04-24 LAB — GLUCOSE, CAPILLARY
Glucose-Capillary: 173 mg/dL — ABNORMAL HIGH (ref 70–99)
Glucose-Capillary: 171 mg/dL — ABNORMAL HIGH (ref 70–99)
Glucose-Capillary: 152 mg/dL — ABNORMAL HIGH (ref 70–99)

## 2012-04-24 MED ORDER — BUDESONIDE-FORMOTEROL FUMARATE 160-4.5 MCG/ACT IN AERO
2.0000 | INHALATION_SPRAY | Freq: Two times a day (BID) | RESPIRATORY_TRACT | Status: DC
  Administered 2012-04-24 – 2012-04-25 (×2): 2 via RESPIRATORY_TRACT
  Filled 2012-04-24: qty 6

## 2012-04-24 MED ORDER — ENOXAPARIN SODIUM 150 MG/ML ~~LOC~~ SOLN
1.0000 mg/kg | Freq: Two times a day (BID) | SUBCUTANEOUS | Status: DC
  Administered 2012-04-24: 125 mg via SUBCUTANEOUS
  Filled 2012-04-24 (×3): qty 1

## 2012-04-24 MED ORDER — OXYCODONE-ACETAMINOPHEN 5-325 MG PO TABS
2.0000 | ORAL_TABLET | Freq: Once | ORAL | Status: AC
  Administered 2012-04-24: 2 via ORAL
  Filled 2012-04-24: qty 2

## 2012-04-24 MED ORDER — PANTOPRAZOLE SODIUM 40 MG PO TBEC
40.0000 mg | DELAYED_RELEASE_TABLET | Freq: Two times a day (BID) | ORAL | Status: DC
Start: 2012-04-24 — End: 2012-04-25
  Administered 2012-04-24 – 2012-04-25 (×2): 40 mg via ORAL
  Filled 2012-04-24 (×4): qty 1

## 2012-04-24 MED ORDER — WARFARIN SODIUM 7.5 MG PO TABS
15.0000 mg | ORAL_TABLET | Freq: Once | ORAL | Status: DC
  Filled 2012-04-24: qty 2

## 2012-04-24 MED ORDER — IBUPROFEN 800 MG PO TABS
800.0000 mg | ORAL_TABLET | Freq: Three times a day (TID) | ORAL | Status: AC
  Administered 2012-04-24 (×3): 800 mg via ORAL
  Filled 2012-04-24 (×3): qty 1

## 2012-04-24 NOTE — Progress Notes (Signed)
ANTICOAGULATION CONSULT NOTE - Follow Up Consult  Pharmacy Consult for Warfarin Indication: CVA  No Known Allergies  Patient Measurements: Height: 5\' 5"  (165.1 cm) Weight: 275 lb 8 oz (124.966 kg) IBW/kg (Calculated) : 57   Vital Signs: Temp: 97.9 F (36.6 C) (08/01 0442) Temp src: Oral (08/01 0442) BP: 105/69 mmHg (08/01 0442) Pulse Rate: 74  (08/01 0442)  Labs:  Basename 04/24/12 0452 04/23/12 0431 04/22/12 0430  HGB -- 13.0 --  HCT -- 38.9 --  PLT -- 291 --  APTT -- -- --  LABPROT 16.9* 16.5* 16.2*  INR 1.35 1.31 1.27  HEPARINUNFRC -- -- --  CREATININE 0.90 0.94 --  CKTOTAL -- -- --  CKMB -- -- --  TROPONINI -- -- --    Estimated Creatinine Clearance: 89.5 ml/min (by C-G formula based on Cr of 0.9).   Medications:  Scheduled:     . acetylcysteine  600 mg Oral BID  . amLODipine  10 mg Oral Daily  . cloNIDine  0.3 mg Oral BID  . clopidogrel  75 mg Oral Daily  . colchicine  0.6 mg Oral Daily  . enoxaparin (LOVENOX) injection  1 mg/kg Subcutaneous Q12H  . fluticasone  1 spray Each Nare Daily  . Fluticasone-Salmeterol  1 puff Inhalation Q12H  . furosemide  20 mg Oral BID  . guaiFENesin  1,200 mg Oral BID  . hydrocortisone cream   Topical TID  . ibuprofen  800 mg Oral TID  . insulin aspart  0-15 Units Subcutaneous TID WC  . insulin aspart  0-5 Units Subcutaneous QHS  . levofloxacin (LEVAQUIN) IV  750 mg Intravenous QHS  . lisinopril  10 mg Oral Daily  . loratadine  10 mg Oral Daily  . metFORMIN  250 mg Oral BID WC  . oxyCODONE-acetaminophen  2 tablet Oral Once  . pantoprazole  40 mg Oral Q1200  . warfarin  10 mg Oral ONCE-1800  . Warfarin - Pharmacist Dosing Inpatient   Does not apply q1800  . DISCONTD: enoxaparin (LOVENOX) injection  60 mg Subcutaneous Q24H  . DISCONTD: furosemide  20 mg Oral BID  . DISCONTD: lisinopril  10 mg Oral Daily  . DISCONTD: metFORMIN  250 mg Oral BID WC  . DISCONTD: valACYclovir  1,000 mg Oral TID   Infusions:     .  sodium chloride 75 mL/hr at 04/23/12 1711    Assessment: [name] admitted 04/20/12 with RLL CAP. On chronic warfarin for hx of stroke (also on Plavix). Of note, she reports that she drinks about 1.2 ounces of alcohol per week. Home warfarin dose reported as 7.5mg  PO daily. INR was subtherapeutic on admission (1.17) and remains subtherapeutic this am but starting to trend up - question outpt compliance vs inadequate home warfarin dose? Now that INR appears slow to respond to high warfarin doses, suspect that home dose was too low.  Lovenox adjusted for adequate bridge until INR therapeutic at 125 mg (1 mg/kg) q12h. Renal function stable. CBC stable. No bleeding reported in chart notes. Drug interaction with Levaquin continues.    Goal of Therapy:  INR 2-3 Monitoring platelets.   Plan:  1) Warfarin 15 mg PO tonight. 2) Watch daily INR trend. 3) Continuing Lovenox until INR therapeutic.  Clance Boll, PharmD, BCPS Pager: [phone] 04/24/2012 10:35 AM

## 2012-04-24 NOTE — Consult Note (Signed)
Name: Teresa Kerr MRN: [id number] DOB: 07-12-1951    LOS: 4  Referring Provider:  [facility] Reason for Referral:  Right side rib pain, effusion, fractured rt 8 th rib.  PULMONARY / CRITICAL CARE MEDICINE  HPI:  61 yo obese AAF, former smoker, who developed first left rib pain that improved over a week then abuptly occurred on  rt rib , both times in the axillary to ant axillary area. Onset of pain was 7/24 with pain not improving despite hospitalization since 7/28. Non contrasted CT of chest 8/1 reveals rt effusion (?hemothorax) and fracture of the right 8 th rib. She denies any trauma, recent URI. But does have chronic cough on acei with dx of asthma requiring freq albuterol. Of note she is on coumadin for prior strokes and her INR was sub therapeutic on admit 1.35. PCCM asked to eval pm 8/1. No h/o hemoptysis or unusual sinus or hb symptoms or sob.  Sleeping ok without nocturnal  or early am exacerbation  of respiratory  c/o's or need for noct saba. Also denies any obvious fluctuation of symptoms with weather or environmental changes or other aggravating or alleviating factors except as outlined above   Key studies/ events since admit 8/1  Venous dopplers neg bilat  Past Medical History  Diagnosis Date  . Hypertension   . Stroke   . Asthma   . Diabetes mellitus   . Gout    Past Surgical History  Procedure Date  . Angioplasty     x 2 times  . Hand surgery    Prior to Admission medications   Medication Sig Start Date End Date Taking? Authorizing Provider  albuterol (PROVENTIL HFA;VENTOLIN HFA) 108 (90 BASE) MCG/ACT inhaler Inhale 2 puffs into the lungs every 6 (six) hours as needed. For shortness of breath   Yes Historical Provider, MD  amLODipine (NORVASC) 10 MG tablet Take 10 mg by mouth daily.     Yes Historical Provider, MD  cetirizine (ZYRTEC ALLERGY) 10 MG tablet Take 10 mg by mouth daily.   Yes Historical Provider, MD  cloNIDine (CATAPRES) 0.3 MG tablet Take 0.3 mg by  mouth 2 (two) times daily.   Yes Historical Provider, MD  clopidogrel (PLAVIX) 75 MG tablet Take 75 mg by mouth daily.     Yes Historical Provider, MD  colchicine 0.6 MG tablet Take 0.6 mg by mouth daily.     Yes Historical Provider, MD  cyclobenzaprine (FLEXERIL) 5 MG tablet Take 1 tablet (5 mg total) by mouth 3 (three) times daily as needed for muscle spasms. 04/20/12 04/30/12 Yes Celene Kras, MD  Fluticasone-Salmeterol (ADVAIR) 500-50 MCG/DOSE AEPB Inhale 1 puff into the lungs every 12 (twelve) hours.   Yes Historical Provider, MD  furosemide (LASIX) 20 MG tablet Take 20 mg by mouth 2 (two) times daily. Am/ noon    Yes Historical Provider, MD  HYDROcodone-acetaminophen (VICODIN) 5-500 MG per tablet Take 1 tablet by mouth every 6 (six) hours as needed. For pain 04/20/12  Yes Celene Kras, MD  lisinopril (PRINIVIL,ZESTRIL) 10 MG tablet Take 10 mg by mouth daily.     Yes Historical Provider, MD  metFORMIN (GLUCOPHAGE) 500 MG tablet Take 250 mg by mouth 2 (two) times daily.    Yes Historical Provider, MD  mometasone (NASONEX) 50 MCG/ACT nasal spray Place 2 sprays into the nose as needed.     Yes Historical Provider, MD  omeprazole (PRILOSEC) 20 MG capsule Take 20 mg by mouth daily.  Yes Historical Provider, MD  oxyCODONE-acetaminophen (PERCOCET/ROXICET) 5-325 MG per tablet Take 1-2 tablets by mouth every 6 (six) hours as needed for pain. 04/20/12 04/30/12 Yes Celene Kras, MD  warfarin (COUMADIN) 7.5 MG tablet Take 7.5 mg by mouth daily.     Yes Historical Provider, MD   Allergies No Known Allergies  Family History Family History  Problem Relation Age of Onset  . Cancer Mother     breast/ had mastecomy  . Cancer Father     colon   Social History  reports that she has quit smoking. She has never used smokeless tobacco. She reports that she drinks about 1.2 ounces of alcohol per week. She reports that she does not use illicit drugs.    ROS  The following are not active complaints unless  bolded sore throat, dysphagia, dental problems, itching, sneezing,  nasal congestion or excess/ purulent secretions, ear ache,   fever, chills, sweats, unintended wt loss, pleuritic or exertional cp, hemoptysis,  orthopnea pnd or leg swelling, presyncope, palpitations, heartburn, abdominal pain, anorexia, nausea, vomiting, diarrhea  or change in bowel or urinary habits, change in stools or urine, dysuria,hematuria,  rash, arthralgias, visual complaints, headache, numbness weakness or ataxia or problems with walking or coordination,  change in mood/affect or memory.         Current Status: Stable Vital Signs: Temp:  [97.6 F (36.4 C)-98.5 F (36.9 C)] 97.9 F (36.6 C) (08/01 0442) Pulse Rate:  [74-90] 74  (08/01 0442) Resp:  [14-16] 16  (08/01 0442) BP: (105-127)/(69-84) 105/69 mmHg (08/01 0442) SpO2:  [93 %-95 %] 94 % (08/01 0838) FIO2:  RA  Physical Examination: General: Morbidly obese AAF nad @rest . Neuro:  No overt defects HEENT:  No adenopathy. Poor dentation Neck:  No JVD Cardiovascular:  HSD Lungs:  Decreased bs bases. Tender rt chest to palpation Abdomen:  Obese + bs Musculoskeletal:  intact Skin:  Warm and dry  Principal Problem:  *Community acquired pneumonia Active Problems:  OBESITY  ASTHMA  Hypoxia  Shingles  Rib pain  Hypertension  History of stroke  Chronic anticoagulation  Diabetes mellitus  Dg Chest 2 View  04/24/2012  *RADIOLOGY REPORT*  Clinical Data: Cough.  Shortness of breath.  History of hypertension.  Diabetes.  Ex-smoker  CHEST - 2 VIEW  Comparison: 728 04/21/2012  Findings: Slightly low lung volumes are present.  Taking this into consideration heart size is mildly enlarged.  A normal mediastinal configuration is seen.  There are small bilateral pleural effusions identified.  Some associated density at both lung bases likely represents concurrent atelectasis. Density anteriorly on the lateral view likely correlates with pericardial fat.  No signs of  congestive failure are seen.  There is probable linear scarring in the left midlung zone.  Bony structures appear intact.  IMPRESSION: Small bilateral pleural effusions with probable mild bibasilar atelectasis and left midlung zone scarring.  No acute abnormality identified.  Original Report Authenticated By: Bertha Stakes, M.D.   Ct Chest Wo Contrast  04/24/2012  *RADIOLOGY REPORT*  Clinical Data: Right sided pleuritic chest pain  CT CHEST WITHOUT CONTRAST  Technique:  Multidetector CT imaging of the chest was performed following the standard protocol without IV contrast.  Comparison: None  Findings: No enlarged axillary or supraclavicular lymph nodes. Asymmetric enlargement of the right lobe of thyroid gland is noted.  No mediastinal or hilar adenopathy is present.  Pleural thickening is noted in the left posterior lung base.  There is a small amount of  right pleural fluid measuring 20 HU.  Overlying compressive type atelectasis is noted.   Mild changes of emphysema identified. There is no focal nodule or mass identified.  The visualized bony structures is significant for mild thoracic spondylosis. Acute right eighth rib fracture is noted, image 33. Limited imaging through the upper abdomen is unremarkable.  IMPRESSION:  1. Right pleural fluid collection and no acute right-sided rib fracture.  Is there a history of trauma?  Could the right pleural fluid to represent a small hemothorax?.  2.  Mild emphysema.  These results will be called to the ordering clinician or representative by the Radiologist Assistant, and communication documented in the PACS Dashboard.  Original Report Authenticated By: Rosealee Albee, M.D.    Lab 04/24/12 0452 04/23/12 0431 04/21/12 0448  NA 138 139 139  K 3.9 3.9 3.8  CL 104 103 103  CO2 25 26 28   BUN 20 19 10   CREATININE 0.90 0.94 0.78  GLUCOSE 148* 156* 157*    Lab 04/23/12 0431 04/21/12 0448 04/20/12 1944  HGB 13.0 12.3 14.2  HCT 38.9 37.9 42.0  WBC 11.0*  12.1* 12.8*  PLT 291 272 311     ASSESSMENT AND PLAN  PULMONARY   A:  Right side pleuritic pain in morbidly obese female, rt rib fractures, ?hemothorax(on coumadin with fx rib) or pleural efuusion. Hx of "asthma" on ACEI P:   -conservative rx off ACEI/ on tramadol qid -consider nebulized BD's with fractured rib  Note: she minimizes the cough because she's used to it and recognizes it as her "asthma" cough - until she's off acei  X one month I can't say to what extent this is really asthma vs pseudoasthma but would be happy to f/u  The hemothorax is tiny and does not need anything but a cxr in  1-2 weeks  Brett Canales Minor ACNP [facility] PCCM Pager [phone] till 3 pm If no answer page [phone] 04/24/2012, 11:47 AM  Pt independently  seen and examined and available cxr's reviewed and I agree with above findings/ imp/ plan   Sandrea Hughs, MD Pulmonary and Critical Care Medicine [facility] Cell [phone]

## 2012-04-24 NOTE — Progress Notes (Signed)
VASCULAR LAB PRELIMINARY  PRELIMINARY  PRELIMINARY  PRELIMINARY  Bilateral lower extremity venous duplex completed.    Preliminary report:  Bilateral:  No evidence of DVT, superficial thrombosis, or Baker's Cyst. Rouleaux flow noted through the popliteal vein on the left.   Shannin Naab, RVS 04/24/2012, 12:05 PM

## 2012-04-24 NOTE — Progress Notes (Signed)
PT Cancellation Note  ___Treatment cancelled today due to medical issues with patient which prohibited   therapy  ___ Treatment cancelled today due to patient receiving procedure or test   ___ Treatment cancelled today due to patient's refusal to participate   _x Treatment cancelled today due to Pt. Has possible pneumothorax and further workup is pending. Pt. Reports up to [facility] but w/ pian R side.

## 2012-04-24 NOTE — Progress Notes (Addendum)
[facility]                                                                                Patient Demographics  Teresa Kerr, is a 61 y.o. female  JXB:[id number]  ZHY:[id number]  DOB - 10-24-1950  Admit date - 04/20/2012  Admitting Physician Sorin Luanne Bras, MD  Outpatient Primary MD for the patient is No primary provider on file.  LOS - 4   Chief Complaint  Patient presents with  . Flank Pain    right        Assessment & Plan    1. Right-sided pleuritic chest pain, cough, tachycardia with mild shortness of breath likely due to Community-acquired pneumonia  -Hazy infiltrates in the rib x-rays, patient also have mild leukocytosis currently has community-acquired pneumonia for which Levaquin will be continued. -Urine test is positive for strep, continue when necessary nebulizer treatments and oxygen as needed. No rash suggestive of shingles as noted will stop valacyclovir at this time.  Patient is on long-term Coumadin which has been resumed, she does have continued right-sided pleuritic chest pain, at this time will get a CT noncontrast of her chest to rule out parapneumonic effusion, angiogram will not be done as even if she has a PE management will not change. Patient is hemodynamically stable and not hypoxic therefore it will not change the management whatsoever. Will check lower extremity duplex for diagnostic purposes.  Addendum -  CT back has Rt sided rib fracture+ ? Hemothorax, Pulm called, HOLD Plavix and Anticoagulation.   .  2. Diabetes mellitus type 2   - hemoglobin A1c, carbohydrate modified diet.  -Insulin sliding scale will be utilized.   Lab Results  Component Value Date   HGBA1C 9.1* 04/22/2012     CBG (last 3)   Basename 04/24/12 0808 04/23/12 2150 04/23/12 1712  GLUCAP 127* 131* 168*      3. Patient noted to be on chronic Coumadin  Patient says she is on Coumadin for history of CVAs in the past, she is unsure if she ever had  irregular heart beats. - Now holding Couamdin/Lovenox due to #1.  Lab Results  Component Value Date   INR 1.35 04/24/2012   INR 1.31 04/23/2012   INR 1.27 04/22/2012       Code Status: Full  Family Communication: Discussed with the  Disposition Plan: Home    Procedures CTA chest   Antibiotics  Levaquin   Time Spent in minutes   30   DVT Prophylaxis  Lovenox    Leroy Sea M.D on 04/24/2012 at 9:21 AM  Between 7am to 7pm - Pager - [phone]  After 7pm go to [email or web address] - password [email or web address]  And look for the night coverage person covering for me after hours  [facility] Office  [phone]    Subjective:   Recia Sons today has, No headache, still has sharp right-sided pleuritic chest pain, No abdominal pain - No Nausea, No new weakness tingling or numbness, productive Cough - mild SOB.   Objective:   Filed Vitals:   04/23/12 2029 04/23/12 2143 04/24/12 0442 04/24/12 0838  BP:  127/84 105/69   Pulse:  90 74  Temp:  97.6 F (36.4 C) 97.9 F (36.6 C)   TempSrc:  Oral Oral   Resp:  14 16   Height:      Weight:      SpO2: 94% 93% 94% 94%    Wt Readings from Last 3 Encounters:  04/21/12 124.966 kg (275 lb 8 oz)  07/27/11 124.467 kg (274 lb 6.4 oz)     Intake/Output Summary (Last 24 hours) at 04/24/12 0921 Last data filed at 04/24/12 0347  Gross per 24 hour  Intake  797.5 ml  Output   1250 ml  Net -452.5 ml    Exam Awake Alert, Oriented X 3, No new F.N deficits, Normal affect [place].AT,PERRAL Supple Neck,No JVD, No cervical lymphadenopathy appriciated.  Symmetrical Chest wall movement, Good air movement bilaterally, CTAB RRR,No Gallops,Rubs or new Murmurs, No Parasternal Heave +ve B.Sounds, Abd Soft, Non tender, No organomegaly appriciated, No rebound - guarding or rigidity. No Cyanosis, Clubbing or edema, No new Rash or bruise    Data Review   CBC  Lab 04/23/12 0431 04/21/12 0448 04/20/12 1944 04/19/12 2325  WBC  11.0* 12.1* 12.8* 12.5*  HGB 13.0 12.3 14.2 13.7  HCT 38.9 37.9 42.0 40.3  PLT 291 272 311 283  MCV 86.8 86.7 85.5 85.6  MCH 29.0 28.1 28.9 29.1  MCHC 33.4 32.5 33.8 34.0  RDW 14.0 13.7 13.5 13.9  LYMPHSABS -- -- 2.9 --  MONOABS -- -- 1.2* --  EOSABS -- -- 0.2 --  BASOSABS -- -- 0.0 --  BANDABS -- -- -- --    Chemistries   Lab 04/24/12 0452 04/23/12 0431 04/21/12 0448 04/20/12 1944 04/19/12 2325  NA 138 139 139 140 138  K 3.9 3.9 3.8 3.8 3.9  CL 104 103 103 102 101  CO2 25 26 28 29 25   GLUCOSE 148* 156* 157* 144* 156*  BUN 20 19 10 9 13   CREATININE 0.90 0.94 0.78 0.73 0.65  CALCIUM 8.8 8.8 8.3* 8.9 9.1  MG -- -- -- -- --  AST -- -- -- -- 13  ALT -- -- -- -- 16  ALKPHOS -- -- -- -- 90  BILITOT -- -- -- -- 0.3   ------------------------------------------------------------------------------------------------------------------ estimated creatinine clearance is 89.5 ml/min (by C-G formula based on Cr of 0.9). ------------------------------------------------------------------------------------------------------------------  Alvira Philips 04/22/12 0430  HGBA1C 9.1*   ------------------------------------------------------------------------------------------------------------------ No results found for this basename: CHOL:2,HDL:2,LDLCALC:2,TRIG:2,CHOLHDL:2,LDLDIRECT:2 in the last 72 hours ------------------------------------------------------------------------------------------------------------------ No results found for this basename: TSH,T4TOTAL,FREET3,T3FREE,THYROIDAB in the last 72 hours ------------------------------------------------------------------------------------------------------------------ No results found for this basename: VITAMINB12:2,FOLATE:2,FERRITIN:2,TIBC:2,IRON:2,RETICCTPCT:2 in the last 72 hours  Coagulation profile  Lab 04/24/12 0452 04/23/12 0431 04/22/12 0430 04/21/12 0448 04/20/12 1944  INR 1.35 1.31 1.27 1.15 1.12  PROTIME -- -- -- -- --    No  results found for this basename: DDIMER:2 in the last 72 hours  Cardiac Enzymes No results found for this basename: CK:3,CKMB:3,TROPONINI:3,MYOGLOBIN:3 in the last 168 hours ------------------------------------------------------------------------------------------------------------------ No components found with this basename: POCBNP:3  Micro Results Recent Results (from the past 240 hour(s))  CULTURE, BLOOD (ROUTINE X 2)     Status: Normal (Preliminary result)   Collection Time   04/20/12 10:20 PM      Component Value Range Status Comment   Specimen Description BLOOD RIGHT ANTECUBITAL   Final    Special Requests BOTTLES DRAWN AEROBIC AND ANAEROBIC 3CC   Final    Culture  Setup Time 04/21/2012 01:40   Final    Culture     Final  Value:        BLOOD CULTURE RECEIVED NO GROWTH TO DATE CULTURE WILL BE HELD FOR 5 DAYS BEFORE ISSUING A FINAL NEGATIVE REPORT   Report Status PENDING   Incomplete   CULTURE, BLOOD (ROUTINE X 2)     Status: Normal (Preliminary result)   Collection Time   04/20/12 10:30 PM      Component Value Range Status Comment   Specimen Description BLOOD RIGHT ARM   Final    Special Requests BOTTLES DRAWN AEROBIC AND ANAEROBIC 5CC   Final    Culture  Setup Time 04/21/2012 01:40   Final    Culture     Final    Value:        BLOOD CULTURE RECEIVED NO GROWTH TO DATE CULTURE WILL BE HELD FOR 5 DAYS BEFORE ISSUING A FINAL NEGATIVE REPORT   Report Status PENDING   Incomplete   CULTURE, EXPECTORATED SPUTUM-ASSESSMENT     Status: Normal   Collection Time   04/21/12  1:46 PM      Component Value Range Status Comment   Specimen Description SPUTUM   Final    Special Requests NONE   Final    Sputum evaluation     Final    Value: THIS SPECIMEN IS ACCEPTABLE. RESPIRATORY CULTURE REPORT TO FOLLOW.   Report Status 04/21/2012 FINAL   Final   CULTURE, RESPIRATORY     Status: Normal (Preliminary result)   Collection Time   04/21/12  1:46 PM      Component Value Range Status Comment    Specimen Description SPUTUM   Final    Special Requests NONE   Final    Gram Stain     Final    Value: NO WBC SEEN     NO SQUAMOUS EPITHELIAL CELLS SEEN     NO ORGANISMS SEEN   Culture Culture reincubated for better growth   Final    Report Status PENDING   Incomplete     Radiology Reports Dg Chest 2 View  04/21/2012  *RADIOLOGY REPORT*  Clinical Data: Follow up pneumonia  CHEST - 2 VIEW  Comparison: 04/20/2012  Findings: Heart size is moderately enlarged.  No pleural effusion or edema identified.  No airspace consolidation identified.  IMPRESSION:  1.  No acute cardiopulmonary abnormalities.  Original Report Authenticated By: Rosealee Albee, M.D.   Dg Chest 2 View  04/20/2012  *RADIOLOGY REPORT*  Clinical Data: Flank pain.  Chest pain.  CHEST - 2 VIEW  Comparison: 09/19/2009  Findings: Moderate cardiomegaly.  Vascular congestion.  No interstitial edema.  No  pneumothorax.  IMPRESSION: Cardiomegaly and vascular congestion.  Original Report Authenticated By: Donavan Burnet, M.D.   Dg Ribs Unilateral W/chest Right  04/20/2012  *RADIOLOGY REPORT*  Clinical Data: Right lateral rib pain.  Lower axillary pain for 3 days.  No known trauma.  RIGHT RIBS AND CHEST - 3+ VIEW  Comparison: Chest x-rays in 28 13  Findings: The heart is enlarged.  No edema.  There is minimal left midlung zone atelectasis. No pneumothorax or acute fracture.  Of the right lateral base, there is minimal patchy density, raising the question of early infiltrate.  IMPRESSION:  1. No evidence for acute osseous abnormality. 2.  Question early infiltrate at the right lateral lung base.  Original Report Authenticated By: Patterson Hammersmith, M.D.    Scheduled Meds:    . acetylcysteine  600 mg Oral BID  . amLODipine  10 mg Oral Daily  . cloNIDine  0.3 mg Oral BID  . clopidogrel  75 mg Oral Daily  . colchicine  0.6 mg Oral Daily  . enoxaparin (LOVENOX) injection  1 mg/kg Subcutaneous Q12H  . fluticasone  1 spray Each Nare Daily    . Fluticasone-Salmeterol  1 puff Inhalation Q12H  . furosemide  20 mg Oral BID  . guaiFENesin  1,200 mg Oral BID  . hydrocortisone cream   Topical TID  . insulin aspart  0-15 Units Subcutaneous TID WC  . insulin aspart  0-5 Units Subcutaneous QHS  . levofloxacin (LEVAQUIN) IV  750 mg Intravenous QHS  . lisinopril  10 mg Oral Daily  . loratadine  10 mg Oral Daily  . metFORMIN  250 mg Oral BID WC  . oxyCODONE-acetaminophen  2 tablet Oral Once  . pantoprazole  40 mg Oral Q1200  . valACYclovir  1,000 mg Oral TID  . warfarin  10 mg Oral ONCE-1800  . Warfarin - Pharmacist Dosing Inpatient   Does not apply q1800  . DISCONTD: enoxaparin (LOVENOX) injection  60 mg Subcutaneous Q24H  . DISCONTD: furosemide  20 mg Oral BID  . DISCONTD: lisinopril  10 mg Oral Daily  . DISCONTD: metFORMIN  250 mg Oral BID WC  . DISCONTD: warfarin  7.5 mg Oral q1800   Continuous Infusions:    . sodium chloride 75 mL/hr at 04/23/12 1711   PRN Meds:.albuterol, chlorpheniramine-HYDROcodone, diphenhydrAMINE, HYDROcodone-acetaminophen, methocarbamol

## 2012-04-25 DIAGNOSIS — Z7901 Long term (current) use of anticoagulants: Secondary | ICD-10-CM

## 2012-04-25 LAB — PROTIME-INR: Prothrombin Time: 18.5 seconds — ABNORMAL HIGH (ref 11.6–15.2)

## 2012-04-25 LAB — CULTURE, RESPIRATORY W GRAM STAIN

## 2012-04-25 LAB — GLUCOSE, CAPILLARY: Glucose-Capillary: 145 mg/dL — ABNORMAL HIGH (ref 70–99)

## 2012-04-25 MED ORDER — OXYCODONE-ACETAMINOPHEN 5-325 MG PO TABS: 1.0000 | ORAL_TABLET | Freq: Four times a day (QID) | ORAL | Status: AC | PRN

## 2012-04-25 MED ORDER — HYDROCOD POLST-CHLORPHEN POLST 10-8 MG/5ML PO LQCR: 5.0000 mL | Freq: Two times a day (BID) | ORAL | Status: DC | PRN

## 2012-04-25 MED ORDER — LEVOFLOXACIN 500 MG PO TABS: 500.0000 mg | ORAL_TABLET | Freq: Every day | ORAL | Status: AC

## 2012-04-25 NOTE — Progress Notes (Addendum)
Patient received medical care at [facility] but the clinic will be closing next week; Patient is agreeable to go to the [facility] for follow up medical care; Apt is April 28, 2012 at 2:30 with Dr Darylene Price. Also patient had questions about finding a primary care physician - patient has private insurance - [facility], information given to patient about the primary care physician in the [place] area. Abelino Derrick RN, BSN,MHA.

## 2012-04-25 NOTE — Discharge Summary (Signed)
[facility]                                                                                   Teresa Kerr, is a 61 y.o. female  DOB Sep 15, 1951  MRN [id number].  Admission date:  04/20/2012  Discharge Date:  04/25/2012  Primary MD  No primary provider on file.  Admitting Physician  Sorin Luanne Bras, MD  Admission Diagnosis  Shingles [053.9] Hypertension [401.9] Community acquired pneumonia [486] Diabetes mellitus [250.00] FLANK PAIN  Discharge Diagnosis     Principal Problem:  *Community acquired pneumonia Active Problems:  OBESITY  ASTHMA  Hypoxia  Shingles  Rib pain  Hypertension  History of stroke  Chronic anticoagulation  Diabetes mellitus  Pleural effusion, R, ? hemothorax   Past Medical History  Diagnosis Date  . Hypertension   . Stroke   . Asthma   . Diabetes mellitus   . Gout     Past Surgical History  Procedure Date  . Angioplasty     x 2 times  . Hand surgery      Recommendations for primary care physician for things to follow:   Please review the patient's 2 view chest x-ray in 3 to 4 days, if a right lower lobe effusion is smaller Coumadin can be cautiously resumed. Note she has a small hemothorax in the right lower lobe.   Discharge Diagnoses:   Principal Problem:  *Community acquired pneumonia Active Problems:  OBESITY  ASTHMA  Hypoxia  Shingles  Rib pain  Hypertension  History of stroke  Chronic anticoagulation  Diabetes mellitus  Pleural effusion, R, ? hemothorax    Discharge Condition: stable   Diet recommendation: See Discharge Instructions below   Consults Pulmonary, Dr Candyce Churn on phone   History of present illness and  Hospital Course:  See H&P, Labs, Consult and Test reports for all details in brief, patient was admitted for  right-sided pleuritic chest pain which was secondary to a spontaneously cracked a rib and a small hemothorax both caused by intense coughing do to being on ACE  inhibitor. Patient is on Plavix and Coumadin for her multiple strokes, she has history of angioplasty to her MCA in the past, she also had evidence of early bronchitis for which she is being needed with oral antibiotics. Will have her follow with pulmonary also outpatient in the next 1 to 2 weeks. Of note she is not hypoxic or tachycardic, and is stable on room air oxygen. Her lower extremity venous duplex is negative.   Patient was seen by pulmonary I discussed her case with Dr. Sherene Sires personally, plan is that her hemothorax is small we will continue her Plavix cautiously, repeat a 2 view chest x-ray but primary care physician in the next 3-4 days, if her hemothorax/right-sided pleural effusion is shrinking at that point Coumadin can be reinitiated with caution . Also have her follow with her primary neurologist and interventional radiologist Dr. Corliss Skains to see if patient needs Coumadin along with Plavix in the future. Patient tells me this is for multiple CVAs.   Her diabetes mellitus and hypertension are stable and she will be continued on her home regimen along  with diabetic heart healthy diet. Of note I have discontinued her ACE inhibitor due to a persistent outpatient monitoring of blood pressure medications and adjustment as needed by primary care physician.    Today   Subjective:   Teresa Kerr today has no headache,no chest abdominal pain,no new weakness tingling or numbness, feels much better wants to go home today.    Objective:   Blood pressure 115/78, pulse 72, temperature 98 F (36.7 C), temperature source Oral, resp. rate 16, height 5\' 5"  (1.651 m), weight 124.966 kg (275 lb 8 oz), SpO2 96.00%.   Intake/Output Summary (Last 24 hours) at 04/25/12 1115 Last data filed at 04/25/12 1033  Gross per 24 hour  Intake    240 ml  Output   1925 ml  Net  -1685 ml    Exam Awake Alert, Oriented *3, No new F.N deficits, Normal affect [place].AT,PERRAL Supple Neck,No JVD, No cervical  lymphadenopathy appriciated.  Symmetrical Chest wall movement, Good air movement bilaterally, CTAB RRR,No Gallops,Rubs or new Murmurs, No Parasternal Heave +ve B.Sounds, Abd Soft, Non tender, No organomegaly appriciated, No rebound -guarding or rigidity. No Cyanosis, Clubbing or edema, No new Rash or bruise  Data Review    Dg Chest 2 View  04/24/2012  *RADIOLOGY REPORT*  Clinical Data: Cough.  Shortness of breath.  History of hypertension.  Diabetes.  Ex-smoker  CHEST - 2 VIEW  Comparison: 728 04/21/2012  Findings: Slightly low lung volumes are present.  Taking this into consideration heart size is mildly enlarged.  A normal mediastinal configuration is seen.  There are small bilateral pleural effusions identified.  Some associated density at both lung bases likely represents concurrent atelectasis. Density anteriorly on the lateral view likely correlates with pericardial fat.  No signs of congestive failure are seen.  There is probable linear scarring in the left midlung zone.  Bony structures appear intact.  IMPRESSION: Small bilateral pleural effusions with probable mild bibasilar atelectasis and left midlung zone scarring.  No acute abnormality identified.  Original Report Authenticated By: Bertha Stakes, M.D.   Dg Chest 2 View  04/21/2012  *RADIOLOGY REPORT*  Clinical Data: Follow up pneumonia  CHEST - 2 VIEW  Comparison: 04/20/2012  Findings: Heart size is moderately enlarged.  No pleural effusion or edema identified.  No airspace consolidation identified.  IMPRESSION:  1.  No acute cardiopulmonary abnormalities.  Original Report Authenticated By: Rosealee Albee, M.D.   Dg Chest 2 View  04/20/2012  *RADIOLOGY REPORT*  Clinical Data: Flank pain.  Chest pain.  CHEST - 2 VIEW  Comparison: 09/19/2009  Findings: Moderate cardiomegaly.  Vascular congestion.  No interstitial edema.  No  pneumothorax.  IMPRESSION: Cardiomegaly and vascular congestion.  Original Report Authenticated By: Donavan Burnet, M.D.   Dg Ribs Unilateral W/chest Right  04/20/2012  *RADIOLOGY REPORT*  Clinical Data: Right lateral rib pain.  Lower axillary pain for 3 days.  No known trauma.  RIGHT RIBS AND CHEST - 3+ VIEW  Comparison: Chest x-rays in 28 13  Findings: The heart is enlarged.  No edema.  There is minimal left midlung zone atelectasis. No pneumothorax or acute fracture.  Of the right lateral base, there is minimal patchy density, raising the question of early infiltrate.  IMPRESSION:  1. No evidence for acute osseous abnormality. 2.  Question early infiltrate at the right lateral lung base.  Original Report Authenticated By: Patterson Hammersmith, M.D.   Ct Chest Wo Contrast  04/24/2012  *RADIOLOGY REPORT*  Clinical Data:  Right sided pleuritic chest pain  CT CHEST WITHOUT CONTRAST  Technique:  Multidetector CT imaging of the chest was performed following the standard protocol without IV contrast.  Comparison: None  Findings: No enlarged axillary or supraclavicular lymph nodes. Asymmetric enlargement of the right lobe of thyroid gland is noted.  No mediastinal or hilar adenopathy is present.  Pleural thickening is noted in the left posterior lung base.  There is a small amount of right pleural fluid measuring 20 HU.  Overlying compressive type atelectasis is noted.   Mild changes of emphysema identified. There is no focal nodule or mass identified.  The visualized bony structures is significant for mild thoracic spondylosis. Acute right eighth rib fracture is noted, image 33. Limited imaging through the upper abdomen is unremarkable.  IMPRESSION:  1. Right pleural fluid collection and no acute right-sided rib fracture.  Is there a history of trauma?  Could the right pleural fluid to represent a small hemothorax?.  2.  Mild emphysema.  These results will be called to the ordering clinician or representative by the Radiologist Assistant, and communication documented in the PACS Dashboard.  Original Report Authenticated By:  Rosealee Albee, M.D.    Micro Results     Recent Results (from the past 240 hour(s))  CULTURE, BLOOD (ROUTINE X 2)     Status: Normal (Preliminary result)   Collection Time   04/20/12 10:20 PM      Component Value Range Status Comment   Specimen Description BLOOD RIGHT ANTECUBITAL   Final    Special Requests BOTTLES DRAWN AEROBIC AND ANAEROBIC 3CC   Final    Culture  Setup Time 04/21/2012 01:40   Final    Culture     Final    Value:        BLOOD CULTURE RECEIVED NO GROWTH TO DATE CULTURE WILL BE HELD FOR 5 DAYS BEFORE ISSUING A FINAL NEGATIVE REPORT   Report Status PENDING   Incomplete   CULTURE, BLOOD (ROUTINE X 2)     Status: Normal (Preliminary result)   Collection Time   04/20/12 10:30 PM      Component Value Range Status Comment   Specimen Description BLOOD RIGHT ARM   Final    Special Requests BOTTLES DRAWN AEROBIC AND ANAEROBIC 5CC   Final    Culture  Setup Time 04/21/2012 01:40   Final    Culture     Final    Value:        BLOOD CULTURE RECEIVED NO GROWTH TO DATE CULTURE WILL BE HELD FOR 5 DAYS BEFORE ISSUING A FINAL NEGATIVE REPORT   Report Status PENDING   Incomplete   CULTURE, EXPECTORATED SPUTUM-ASSESSMENT     Status: Normal   Collection Time   04/21/12  1:46 PM      Component Value Range Status Comment   Specimen Description SPUTUM   Final    Special Requests NONE   Final    Sputum evaluation     Final    Value: THIS SPECIMEN IS ACCEPTABLE. RESPIRATORY CULTURE REPORT TO FOLLOW.   Report Status 04/21/2012 FINAL   Final   CULTURE, RESPIRATORY     Status: Normal   Collection Time   04/21/12  1:46 PM      Component Value Range Status Comment   Specimen Description SPUTUM   Final    Special Requests NONE   Final    Gram Stain     Final    Value: NO WBC SEEN  NO SQUAMOUS EPITHELIAL CELLS SEEN     NO ORGANISMS SEEN   Culture STREPTOCOCCUS,BETA HEMOLYIC NOT GROUP A   Final    Report Status 04/25/2012 FINAL   Final      CBC w Diff: Lab Results  Component Value  Date   WBC 11.0* 04/23/2012   HGB 13.0 04/23/2012   HCT 38.9 04/23/2012   PLT 291 04/23/2012   LYMPHOPCT 22 04/20/2012   MONOPCT 10 04/20/2012   EOSPCT 2 04/20/2012   BASOPCT 0 04/20/2012    CMP: Lab Results  Component Value Date   NA 138 04/24/2012   K 3.9 04/24/2012   CL 104 04/24/2012   CO2 25 04/24/2012   BUN 20 04/24/2012   CREATININE 0.90 04/24/2012   PROT 7.4 04/19/2012   ALBUMIN 3.5 04/19/2012   BILITOT 0.3 04/19/2012   ALKPHOS 90 04/19/2012   AST 13 04/19/2012   ALT 16 04/19/2012  .   Discharge Instructions     Follow with Primary MD  in 3 days   Get CBC, CMP, INR checked 3 days by Primary MD and again as instructed by your Primary MD. Get a 2 view Chest X ray done next visit.  After the year repeat 2 view chest x-ray if it looks stable your Coumadin needs to be restarted in the next week by your family physician.  Get Medicines reviewed and adjusted.  Please request your Prim.MD to go over all Hospital Tests and Procedure/Radiological results at the follow up, please get all Hospital records sent to your Prim MD by signing hospital release before you go home.  Activity: As tolerated with Full fall precautions use walker/cane & assistance as needed   Diet: Heart healthy low carbohydrate  For Heart failure patients - Check your Weight same time everyday, if you gain over 2 pounds, or you develop in leg swelling, experience more shortness of breath or chest pain, call your Primary MD immediately. Follow Cardiac Low Salt Diet and 1.8 lit/day fluid restriction.  Disposition Home    If you experience worsening of your admission symptoms, develop shortness of breath, life threatening emergency, suicidal or homicidal thoughts you must seek medical attention immediately by calling 911 or calling your MD immediately  if symptoms less severe.  You Must read complete instructions/literature along with all the possible adverse reactions/side effects for all the Medicines you take and that have  been prescribed to you. Take any new Medicines after you have completely understood and accpet all the possible adverse reactions/side effects.   Do not drive if your were admitted for syncope or siezures until you have seen by Primary MD or a Neurologist and advised to drive.  Do not drive when taking Pain medications.    Do not take more than prescribed Pain, Sleep and Anxiety Medications  Special Instructions: If you have smoked or chewed Tobacco  in the last 2 yrs please stop smoking, stop any regular Alcohol  and or any Recreational drug use.  Wear Seat belts while driving.  Follow-up Information    Follow up with Your primary care physician as suggested by case manager. Schedule an appointment as soon as possible for a visit in 3 days.      Follow up with Oneal Grout, MD. Schedule an appointment as soon as possible for a visit in 1 week.   Contact information:   [address] [place] [address] [phone]       Follow up with SETHI,PRAMODKUMAR P,  MD. Schedule an appointment as soon as possible for a visit in 1 week.   Contact information:   [address] [facility] [place] [address] [phone]       Follow up with Sandrea Hughs, MD. Schedule an appointment as soon as possible for a visit in 1 week.   Contact information:   [address] [facility] [address] [place] [address] [place] [address] [phone]            Discharge Medications   Medication List  As of 04/25/2012 11:15 AM   START taking these medications         chlorpheniramine-HYDROcodone 10-8 MG/5ML Lqcr   Commonly known as: TUSSIONEX   Take 5 mLs by mouth every 12 (twelve) hours as needed.      levofloxacin 500 MG tablet   Commonly known as: LEVAQUIN   Take 1 tablet (500 mg total) by mouth daily.         CHANGE how you take these medications         oxyCODONE-acetaminophen 5-325 MG per tablet   Commonly  known as: PERCOCET/ROXICET   Take 1 tablet by mouth every 6 (six) hours as needed for pain.   What changed: dose         CONTINUE taking these medications         albuterol 108 (90 BASE) MCG/ACT inhaler   Commonly known as: PROVENTIL HFA;VENTOLIN HFA      amLODipine 10 MG tablet   Commonly known as: NORVASC      cloNIDine 0.3 MG tablet   Commonly known as: CATAPRES      clopidogrel 75 MG tablet   Commonly known as: PLAVIX      colchicine 0.6 MG tablet      cyclobenzaprine 5 MG tablet   Commonly known as: FLEXERIL   Take 1 tablet (5 mg total) by mouth 3 (three) times daily as needed for muscle spasms.      Fluticasone-Salmeterol 500-50 MCG/DOSE Aepb   Commonly known as: ADVAIR      furosemide 20 MG tablet   Commonly known as: LASIX      HYDROcodone-acetaminophen 5-500 MG per tablet   Commonly known as: VICODIN   Take 1 tablet by mouth every 6 (six) hours as needed. For pain      metFORMIN 500 MG tablet   Commonly known as: GLUCOPHAGE      mometasone 50 MCG/ACT nasal spray   Commonly known as: NASONEX      omeprazole 20 MG capsule   Commonly known as: PRILOSEC      ZYRTEC ALLERGY 10 MG tablet   Generic drug: cetirizine         STOP taking these medications         lisinopril 10 MG tablet      warfarin 7.5 MG tablet          Where to get your medications    These are the prescriptions that you need to pick up.   You may get these medications from any pharmacy.         chlorpheniramine-HYDROcodone 10-8 MG/5ML Lqcr   levofloxacin 500 MG tablet   oxyCODONE-acetaminophen 5-325 MG per tablet               Total Time in preparing paper work, data evaluation and todays exam - 35 minutes  Susa Raring K M.D on 04/25/2012 at 11:15 AM  [facility] Office  [phone]    

## 2012-04-27 LAB — CULTURE, BLOOD (ROUTINE X 2): Culture: NO GROWTH

## 2012-05-07 ENCOUNTER — Telehealth (HOSPITAL_COMMUNITY): Payer: Self-pay

## 2012-05-07 ENCOUNTER — Other Ambulatory Visit (HOSPITAL_COMMUNITY): Payer: Self-pay | Admitting: Interventional Radiology

## 2012-05-07 DIAGNOSIS — I771 Stricture of artery: Secondary | ICD-10-CM

## 2012-05-07 NOTE — Telephone Encounter (Signed)
Mrs. Boss called the office today asking if she could change her medications.  Dr. Corliss Skains stated that she needs an angio for determining the switch.

## 2012-05-08 ENCOUNTER — Encounter (HOSPITAL_COMMUNITY): Payer: Self-pay | Admitting: Respiratory Therapy

## 2012-05-08 ENCOUNTER — Other Ambulatory Visit: Payer: Self-pay | Admitting: Radiology

## 2012-05-12 ENCOUNTER — Ambulatory Visit (INDEPENDENT_AMBULATORY_CARE_PROVIDER_SITE_OTHER): Payer: Medicare Other | Admitting: Adult Health

## 2012-05-12 ENCOUNTER — Ambulatory Visit (INDEPENDENT_AMBULATORY_CARE_PROVIDER_SITE_OTHER)
Admission: RE | Admit: 2012-05-12 | Discharge: 2012-05-12 | Disposition: A | Discharge status: Home / Self Care | Payer: Medicare Other | Source: Ambulatory Visit | Attending: Adult Health | Admitting: Adult Health

## 2012-05-12 ENCOUNTER — Encounter: Payer: Self-pay | Admitting: Adult Health

## 2012-05-12 VITALS — BP 126/84 | HR 95 | Temp 97.0°F | Ht 65.0 in | Wt 273.6 lb

## 2012-05-12 DIAGNOSIS — J189 Pneumonia, unspecified organism: Secondary | ICD-10-CM

## 2012-05-12 DIAGNOSIS — J45909 Unspecified asthma, uncomplicated: Secondary | ICD-10-CM

## 2012-05-12 DIAGNOSIS — J90 Pleural effusion, not elsewhere classified: Secondary | ICD-10-CM

## 2012-05-12 DIAGNOSIS — Z8673 Personal history of transient ischemic attack (TIA), and cerebral infarction without residual deficits: Secondary | ICD-10-CM

## 2012-05-12 MED ORDER — FLUTICASONE-SALMETEROL 250-50 MCG/DOSE IN AEPB: 1.0000 | INHALATION_SPRAY | Freq: Two times a day (BID) | RESPIRATORY_TRACT | Status: DC

## 2012-05-12 NOTE — Assessment & Plan Note (Signed)
Recent flare with cough - on ACE inhibitor  Will decrease Advair to 250 /50 1 puff Twice daily   Have her return in 4 weeks with PFT

## 2012-05-12 NOTE — Patient Instructions (Addendum)
Decrease Advair 250/50mg  1 puffs Twice daily  - brush/rinse/gargle after use.  follow up Dr. Sherene Sires  In 4 weeks with PFTs  Please contact office for sooner follow up if symptoms do not improve or worsen or seek emergency care  We are going to help get you in with Dr. Pearlean Brownie to discuss your coumadin

## 2012-05-12 NOTE — Assessment & Plan Note (Signed)
Resolved on xray  Continue on current regimen.

## 2012-05-12 NOTE — Progress Notes (Signed)
  Subjective:    Patient ID: Teresa Kerr, female    DOB: 20-Aug-1951, 61 y.o.   MRN: [id number]  HPI 61 yo female , former smoker seen for initial pulmonary consult 04/24/12 for right sided rib pain found to have a small hemothorax due rib fracture felt secondary to excessive coughing (on Ace Inhibitor )   05/12/2012 Post Hospital follow up  Returns for post hospital follow up .  Admitted 04/24/12 for severe rib pain found to have rib fx found to have a small hemothorax -felt secondary to excessive coughing on ACE inhibitor. She was taken off ACE and placed on cough/pain control meds.   CXR today shows clear lungs,  And apparent resolution of right effusion and  Atelectasis.  Since discharge cough is better but not totally gone.  She is very confused with her meds   Has ov with IR , Dr. Marcheta Grammes as she is not taking coumadin or plavix since discharge.  She is planning to discuss with him this Friday  I explained to her I did not see that she was suppose to stop these meds.  Will help set up ov with Neuro to discuss plan with meds.      Review of Systems Constitutional:   No  weight loss, night sweats,  Fevers, chills, fatigue, or  lassitude.  HEENT:   No headaches,  Difficulty swallowing,  Tooth/dental problems, or  Sore throat,                No sneezing, itching, ear ache, nasal congestion, post nasal drip,   CV:  No chest pain,  Orthopnea, PND, swelling in lower extremities, anasarca, dizziness, palpitations, syncope.   GI  No heartburn, indigestion, abdominal pain, nausea, vomiting, diarrhea, change in bowel habits, loss of appetite, bloody stools.   Resp: No shortness of breath with exertion or at rest.  No excess mucus, no productive cough,  No non-productive cough,  No coughing up of blood.  No change in color of mucus.  No wheezing.  No chest wall deformity  Skin: no rash or lesions.  GU: no dysuria, change in color of urine, no urgency or frequency.  No flank pain, no  hematuria   MS:  No joint pain or swelling.  No decreased range of motion.  No back pain.  Psych:  No change in mood or affect. No depression or anxiety.  No memory loss.         Objective:   Physical Exam GEN: A/Ox3; pleasant , NAD, well nourished   HEENT:  [place]/AT,  EACs-clear, TMs-wnl, NOSE-clear, THROAT-clear, no lesions, no postnasal drip or exudate noted.   NECK:  Supple w/ fair ROM; no JVD; normal carotid impulses w/o bruits; no thyromegaly or nodules palpated; no lymphadenopathy.  RESP  Clear  P & A; w/o, wheezes/ rales/ or rhonchi.no accessory muscle use, no dullness to percussion  CARD:  RRR, no m/r/g  , no peripheral edema, pulses intact, no cyanosis or clubbing.  GI:   Soft & nt; nml bowel sounds; no organomegaly or masses detected.  Musco: Warm bil, no deformities or joint swelling noted.   Neuro: alert, no focal deficits noted.    Skin: Warm, no lesions or rashes         Assessment & Plan:

## 2012-05-12 NOTE — Assessment & Plan Note (Signed)
Refer back to Neuro to discuss med management as she seems to have stopped several of her meds including coumadin and plavix.  Encouraged her to bring all her meds with her to each visit.

## 2012-05-15 ENCOUNTER — Encounter (HOSPITAL_COMMUNITY): Payer: Self-pay | Admitting: Pharmacy Technician

## 2012-05-16 ENCOUNTER — Ambulatory Visit (HOSPITAL_COMMUNITY)
Admission: RE | Admit: 2012-05-16 | Discharge: 2012-05-16 | Payer: Medicare Other | Source: Ambulatory Visit | Attending: Interventional Radiology | Admitting: Interventional Radiology

## 2012-05-19 ENCOUNTER — Other Ambulatory Visit: Payer: Self-pay | Admitting: Radiology

## 2012-05-22 ENCOUNTER — Telehealth (HOSPITAL_COMMUNITY): Payer: Self-pay

## 2012-05-22 ENCOUNTER — Ambulatory Visit (HOSPITAL_COMMUNITY)
Admission: RE | Admit: 2012-05-22 | Discharge: 2012-05-22 | Disposition: A | Discharge status: Home / Self Care | Payer: Medicare Other | Source: Ambulatory Visit | Attending: Interventional Radiology | Admitting: Interventional Radiology

## 2012-05-22 DIAGNOSIS — Z538 Procedure and treatment not carried out for other reasons: Secondary | ICD-10-CM | POA: Insufficient documentation

## 2012-05-22 DIAGNOSIS — I771 Stricture of artery: Secondary | ICD-10-CM

## 2012-05-22 MED ORDER — SODIUM CHLORIDE 0.9 % IV SOLN: Freq: Once | INTRAVENOUS | Status: DC

## 2012-05-22 NOTE — Telephone Encounter (Signed)
Mrs. Dipierro son's phone is not in service [phone].

## 2012-05-22 NOTE — Telephone Encounter (Signed)
Per the apt cancelled on the 23rd of Aug. Mrs. Teresa Kerr stated that her daughter could not bring her so we had to reschedule.  Teresa Kerr came in this morning and stated that she did not know she needed a family member to bring her and pick her up.  Per Teresa Kerr  I will call the pt's son and daughter to try to reschedule with their schedules in order for their mother to have proper transportation.

## 2012-05-22 NOTE — Telephone Encounter (Signed)
Per the apt

## 2012-05-22 NOTE — Telephone Encounter (Signed)
I left a message on Fran's ( the daughter) phone [phone].  I will call her son as well.

## 2012-07-03 ENCOUNTER — Encounter (HOSPITAL_COMMUNITY): Payer: Self-pay | Admitting: Emergency Medicine

## 2012-07-03 ENCOUNTER — Emergency Department (INDEPENDENT_AMBULATORY_CARE_PROVIDER_SITE_OTHER)
Admission: EM | Admit: 2012-07-03 | Discharge: 2012-07-03 | Disposition: A | Discharge status: Home / Self Care | Payer: PRIVATE HEALTH INSURANCE | Source: Home / Self Care | Attending: Family Medicine | Admitting: Family Medicine

## 2012-07-03 DIAGNOSIS — S3140XA Unspecified open wound of vagina and vulva, initial encounter: Secondary | ICD-10-CM

## 2012-07-03 DIAGNOSIS — S3141XA Laceration without foreign body of vagina and vulva, initial encounter: Secondary | ICD-10-CM

## 2012-07-03 DIAGNOSIS — N760 Acute vaginitis: Secondary | ICD-10-CM

## 2012-07-03 LAB — WET PREP, GENITAL: Yeast Wet Prep HPF POC: NONE SEEN

## 2012-07-03 MED ORDER — METRONIDAZOLE 0.75 % VA GEL: 1.0000 | VAGINAL | Status: DC

## 2012-07-03 NOTE — ED Provider Notes (Signed)
History     CSN: [id number]  Arrival date & time 07/03/12  1645   First MD Initiated Contact with Patient 07/03/12 1646      Chief Complaint  Patient presents with  . Vaginal Pain    (Consider location/radiation/quality/duration/timing/severity/associated sxs/prior treatment) Patient is a 61 y.o. female presenting with vaginal pain. The history is provided by the patient.  Vaginal Pain This is a new problem. The current episode started more than 2 days ago. The problem has not changed since onset.The symptoms are aggravated by intercourse (onset sat of discomfort after sex on thurs., feels that it is related., denies d/c.).    Past Medical History  Diagnosis Date  . Hypertension   . Stroke   . Asthma   . Diabetes mellitus   . Gout     Past Surgical History  Procedure Date  . Angioplasty     x 2 times  . Hand surgery     Family History  Problem Relation Age of Onset  . Cancer Mother     breast/ had mastecomy  . Cancer Father     colon    History  Substance Use Topics  . Smoking status: Former Smoker -- 1.5 packs/day for 34 years    Quit date: 09/24/1997  . Smokeless tobacco: Never Used  . Alcohol Use: No     socially    OB History    Grav Para Term Preterm Abortions TAB SAB Ect Mult Living   3 3 3       3       Review of Systems  Constitutional: Negative.   Genitourinary: Positive for dysuria, vaginal pain and pelvic pain. Negative for urgency, frequency, vaginal bleeding and vaginal discharge.    Allergies  Review of patient's allergies indicates no known allergies.  Home Medications   Current Outpatient Rx  Name Route Sig Dispense Refill  . ACETAMINOPHEN 500 MG PO TABS Oral Take 500 mg by mouth every 6 (six) hours as needed. For pain    . ALBUTEROL SULFATE HFA 108 (90 BASE) MCG/ACT IN AERS Inhalation Inhale 2 puffs into the lungs every 6 (six) hours as needed. For shortness of breath    . AMLODIPINE BESYLATE 10 MG PO TABS Oral Take 10 mg by  mouth daily.      Marland Kitchen CETIRIZINE HCL 10 MG PO TABS Oral Take 10 mg by mouth daily.    Marland Kitchen CLONIDINE HCL 0.3 MG PO TABS Oral Take 0.3 mg by mouth daily.     Marland Kitchen CLOPIDOGREL BISULFATE 75 MG PO TABS Oral Take 1 tablet by mouth daily.    . COLCHICINE 0.6 MG PO TABS Oral Take 0.6 mg by mouth daily.      . CYCLOBENZAPRINE HCL 5 MG PO TABS Oral Take 5 mg by mouth 3 (three) times daily as needed. For muscle pain    . FLUOCINONIDE 0.05 % EX GEL Topical Apply 1 application topically 3 (three) times daily as needed. For itching    . FLUTICASONE-SALMETEROL 250-50 MCG/DOSE IN AEPB Inhalation Inhale 1 puff into the lungs 2 (two) times daily. 1 each 5  . FUROSEMIDE 20 MG PO TABS Oral Take 20 mg by mouth daily.     Marland Kitchen HYDROCODONE-ACETAMINOPHEN 5-500 MG PO TABS Oral Take 1 tablet by mouth every 6 (six) hours as needed. For pain 30 tablet 0  . METFORMIN HCL 500 MG PO TABS Oral Take 250 mg by mouth 2 (two) times daily.     Marland Kitchen METRONIDAZOLE 0.75 %  VA GEL Vaginal Place 1 Applicatorful vaginally 1 day or 1 dose. At bedtime for 5 nights. 70 g 0  . MOMETASONE FUROATE 50 MCG/ACT NA SUSP Nasal Place 2 sprays into the nose as needed. For allergies    . OMEPRAZOLE 20 MG PO CPDR Oral Take 20 mg by mouth daily.      . OXYCODONE-ACETAMINOPHEN 5-325 MG PO TABS Oral Take 1 tablet by mouth Every 6 hours as needed. For pain    . WARFARIN SODIUM 7.5 MG PO TABS Oral Take 1 tablet by mouth daily.      BP 147/59  Pulse 78  Temp 98.4 F (36.9 C) (Oral)  Resp 20  SpO2 100%  Physical Exam  Nursing note and vitals reviewed. Constitutional: She is oriented to person, place, and time. She appears well-developed and well-nourished.  Abdominal: Soft. Bowel sounds are normal.  Genitourinary:    Uterus is enlarged. Cervix exhibits discharge. Cervix exhibits no motion tenderness and no friability. Vaginal discharge found.  Neurological: She is alert and oriented to person, place, and time.  Skin: Skin is warm and dry.    ED Course    Procedures (including critical care time)   Labs Reviewed  GC/CHLAMYDIA PROBE AMP, GENITAL  WET PREP, GENITAL   No results found.   1. Labial tear   2. Vaginitis       MDM          Linna Hoff, MD 07/03/12 [phone]

## 2012-07-03 NOTE — ED Notes (Signed)
Reports having vaginal pain onset Saturday.

## 2012-07-09 NOTE — ED Notes (Signed)
GC/Chlamydia neg., Wet prep: Many clue cells, few WBC's. Pt. adequately treated with Metrogel. Vassie Moselle 07/08/2012

## 2012-09-22 ENCOUNTER — Encounter (HOSPITAL_COMMUNITY): Payer: Self-pay | Admitting: *Deleted

## 2012-09-22 ENCOUNTER — Emergency Department (INDEPENDENT_AMBULATORY_CARE_PROVIDER_SITE_OTHER)
Admission: EM | Admit: 2012-09-22 | Discharge: 2012-09-22 | Disposition: A | Discharge status: Home / Self Care | Payer: PRIVATE HEALTH INSURANCE | Source: Home / Self Care | Attending: Emergency Medicine | Admitting: Emergency Medicine

## 2012-09-22 DIAGNOSIS — H60399 Other infective otitis externa, unspecified ear: Secondary | ICD-10-CM

## 2012-09-22 MED ORDER — CIPROFLOXACIN-HYDROCORTISONE 0.2-1 % OT SUSP: 3.0000 [drp] | Freq: Two times a day (BID) | OTIC | Status: AC

## 2012-09-22 MED ORDER — LEVOFLOXACIN 500 MG PO TABS: 500.0000 mg | ORAL_TABLET | Freq: Every day | ORAL | Status: DC

## 2012-09-22 MED ORDER — WARFARIN SODIUM 7.5 MG PO TABS: 5.0000 mg | ORAL_TABLET | Freq: Every day | ORAL | Status: DC

## 2012-09-22 NOTE — ED Notes (Signed)
Pt  Has  Redness   Swelling  And  Tenderness  Of the  l  Ear       She  Reports   Symptoms  X  3  Days    denys  Any  Known  Bite  Or  Injury  Or  Known  Causative  Agent

## 2012-09-22 NOTE — ED Provider Notes (Signed)
History     CSN: [id number]  Arrival date & time 09/22/12  1748   First MD Initiated Contact with Patient 09/22/12 1753      Chief Complaint  Patient presents with  . Otalgia    (Consider location/radiation/quality/duration/timing/severity/associated sxs/prior treatment) HPI Comments: Patient presents urgent care this evening complaining of redness swelling and increased tenderness of her left ear. She reports the symptoms for the last 3 days she denies any injury or bites but does describe that she develops ear infections quite easily and frequently. At this point she can barely touch her ear as it hurts allover. Denies any fevers but have seen some discrete drainage coming out of her left ear. " I don't clean my ears" " I don't put anything in my ears" " I do get this infections frequently sometimes they go away sometimes any treatment".  Patient is a 61 y.o. female presenting with ear pain.  Otalgia This is a new problem. The current episode started more than 2 days ago. There is pain in the left ear. The problem occurs constantly. The problem has been gradually worsening. There has been no fever. Associated symptoms include ear discharge. Pertinent negatives include no headaches, no hearing loss, no rhinorrhea, no abdominal pain, no neck pain, no cough and no rash. Her past medical history is significant for tympanostomy tube. Her past medical history does not include hearing loss.    Past Medical History  Diagnosis Date  . Hypertension   . Stroke   . Asthma   . Diabetes mellitus   . Gout     Past Surgical History  Procedure Date  . Angioplasty     x 2 times  . Hand surgery     Family History  Problem Relation Age of Onset  . Cancer Mother     breast/ had mastecomy  . Cancer Father     colon    History  Substance Use Topics  . Smoking status: Former Smoker -- 1.5 packs/day for 34 years    Quit date: 09/24/1997  . Smokeless tobacco: Never Used  . Alcohol Use: No      Comment: socially    OB History    Grav Para Term Preterm Abortions TAB SAB Ect Mult Living   3 3 3       3       Review of Systems  Constitutional: Negative for fever, activity change and appetite change.  HENT: Positive for ear pain and ear discharge. Negative for hearing loss, nosebleeds, congestion, rhinorrhea, neck pain, postnasal drip and tinnitus.   Respiratory: Negative for cough.   Gastrointestinal: Negative for abdominal pain.  Skin: Negative for rash.  Neurological: Negative for dizziness, facial asymmetry and headaches.    Allergies  Review of patient's allergies indicates no known allergies.  Home Medications   Current Outpatient Rx  Name  Route  Sig  Dispense  Refill  . ACETAMINOPHEN 500 MG PO TABS   Oral   Take 500 mg by mouth every 6 (six) hours as needed. For pain         . ALBUTEROL SULFATE HFA 108 (90 BASE) MCG/ACT IN AERS   Inhalation   Inhale 2 puffs into the lungs every 6 (six) hours as needed. For shortness of breath         . AMLODIPINE BESYLATE 10 MG PO TABS   Oral   Take 10 mg by mouth daily.           Marland Kitchen CETIRIZINE  HCL 10 MG PO TABS   Oral   Take 10 mg by mouth daily.         Marland Kitchen CIPROFLOXACIN-HYDROCORTISONE 0.2-1 % OT SUSP   Left Ear   Place 3 drops into the left ear 2 (two) times daily.   10 mL   0   . CLONIDINE HCL 0.3 MG PO TABS   Oral   Take 0.3 mg by mouth daily.          Marland Kitchen CLOPIDOGREL BISULFATE 75 MG PO TABS   Oral   Take 1 tablet by mouth daily.         . COLCHICINE 0.6 MG PO TABS   Oral   Take 0.6 mg by mouth daily.           . CYCLOBENZAPRINE HCL 5 MG PO TABS   Oral   Take 5 mg by mouth 3 (three) times daily as needed. For muscle pain         . FLUOCINONIDE 0.05 % EX GEL   Topical   Apply 1 application topically 3 (three) times daily as needed. For itching         . FLUTICASONE-SALMETEROL 250-50 MCG/DOSE IN AEPB   Inhalation   Inhale 1 puff into the lungs 2 (two) times daily.   1 each   5     . FUROSEMIDE 20 MG PO TABS   Oral   Take 20 mg by mouth daily.          Marland Kitchen HYDROCODONE-ACETAMINOPHEN 5-500 MG PO TABS   Oral   Take 1 tablet by mouth every 6 (six) hours as needed. For pain   30 tablet   0   . LEVOFLOXACIN 500 MG PO TABS   Oral   Take 1 tablet (500 mg total) by mouth daily.   7 tablet   0   . METFORMIN HCL 500 MG PO TABS   Oral   Take 250 mg by mouth 2 (two) times daily.          Marland Kitchen METRONIDAZOLE 0.75 % VA GEL   Vaginal   Place 1 Applicatorful vaginally 1 day or 1 dose. At bedtime for 5 nights.   70 g   0   . MOMETASONE FUROATE 50 MCG/ACT NA SUSP   Nasal   Place 2 sprays into the nose as needed. For allergies         . OMEPRAZOLE 20 MG PO CPDR   Oral   Take 20 mg by mouth daily.           . OXYCODONE-ACETAMINOPHEN 5-325 MG PO TABS   Oral   Take 1 tablet by mouth Every 6 hours as needed. For pain         . WARFARIN SODIUM 7.5 MG PO TABS   Oral   Take 1 tablet by mouth daily.           BP 123/69  Pulse 77  Temp 98.9 F (37.2 C) (Oral)  Resp 20  SpO2 94%  Physical Exam  Nursing note and vitals reviewed. Constitutional: She appears well-developed and well-nourished.  HENT:  Right Ear: Tympanic membrane normal. No drainage, swelling or tenderness. No middle ear effusion. No decreased hearing is noted.  Left Ear: Tympanic membrane and ear canal normal. No lacerations. There is drainage, swelling and tenderness. No foreign bodies. No mastoid tenderness.  No middle ear effusion. No hemotympanum.  Ears:  Neurological: She is alert.  Skin: No rash noted. No erythema.  ED Course  Procedures (including critical care time)  Labs Reviewed - No data to display No results found.   1. Bacterial external ear infection       MDM  Left otitis externa- involving external ear- patient has a history of recurrent ear infections and is at high risk complications ( diabetes). Patient has been prescribed both antibiotic eardrops (cipro)  and a course of 7 days of Levaquin. Have also instructed her that if no improvement is noted 48-72 hours to followup with an ENT Dr. or to go to the emergency department if worsening pain or swelling. Have also modified her Coumadin dose while she remains taking the Levaquin to 3.5 mg daily.    Jimmie Molly, MD 09/22/12 2003

## 2013-06-26 ENCOUNTER — Encounter (HOSPITAL_COMMUNITY): Payer: Self-pay | Admitting: *Deleted

## 2013-06-26 ENCOUNTER — Emergency Department (HOSPITAL_COMMUNITY)
Admission: EM | Admit: 2013-06-26 | Discharge: 2013-06-26 | Disposition: A | Discharge status: Home / Self Care | Payer: PRIVATE HEALTH INSURANCE | Attending: Emergency Medicine | Admitting: Emergency Medicine

## 2013-06-26 ENCOUNTER — Emergency Department (HOSPITAL_COMMUNITY): Payer: PRIVATE HEALTH INSURANCE

## 2013-06-26 DIAGNOSIS — M109 Gout, unspecified: Secondary | ICD-10-CM | POA: Insufficient documentation

## 2013-06-26 DIAGNOSIS — I10 Essential (primary) hypertension: Secondary | ICD-10-CM | POA: Insufficient documentation

## 2013-06-26 DIAGNOSIS — Z79899 Other long term (current) drug therapy: Secondary | ICD-10-CM | POA: Insufficient documentation

## 2013-06-26 DIAGNOSIS — E119 Type 2 diabetes mellitus without complications: Secondary | ICD-10-CM | POA: Insufficient documentation

## 2013-06-26 DIAGNOSIS — Z8673 Personal history of transient ischemic attack (TIA), and cerebral infarction without residual deficits: Secondary | ICD-10-CM | POA: Insufficient documentation

## 2013-06-26 DIAGNOSIS — S92309A Fracture of unspecified metatarsal bone(s), unspecified foot, initial encounter for closed fracture: Secondary | ICD-10-CM | POA: Insufficient documentation

## 2013-06-26 DIAGNOSIS — Y92009 Unspecified place in unspecified non-institutional (private) residence as the place of occurrence of the external cause: Secondary | ICD-10-CM | POA: Insufficient documentation

## 2013-06-26 DIAGNOSIS — Z87891 Personal history of nicotine dependence: Secondary | ICD-10-CM | POA: Insufficient documentation

## 2013-06-26 DIAGNOSIS — Y9389 Activity, other specified: Secondary | ICD-10-CM | POA: Insufficient documentation

## 2013-06-26 DIAGNOSIS — X500XXA Overexertion from strenuous movement or load, initial encounter: Secondary | ICD-10-CM | POA: Insufficient documentation

## 2013-06-26 DIAGNOSIS — J45909 Unspecified asthma, uncomplicated: Secondary | ICD-10-CM | POA: Insufficient documentation

## 2013-06-26 DIAGNOSIS — S92301A Fracture of unspecified metatarsal bone(s), right foot, initial encounter for closed fracture: Secondary | ICD-10-CM

## 2013-06-26 NOTE — ED Provider Notes (Signed)
CSN: [id number]     Arrival date & time 06/26/13  6578 History   First MD Initiated Contact with Patient 06/26/13 [phone]     Chief Complaint  Patient presents with  . Hypertension  . Foot Pain   (Consider location/radiation/quality/duration/timing/severity/associated sxs/prior Treatment) The history is provided by the patient and medical records.   Patient presents to the ED for right foot and ankle pain following a twist injury yesterday at her home. Patient has had a prior stroke with some right-sided deficit, she walks assisted with a cane on a daily basis. Patient states she was attempting to go down the stairs yesterday when she twisted her right foot. She did not fall, experience head trauma or LOC.  Initially pain was not that severe, and she just tried to rest, however this morning upon waking pain was much more intense.  Patient is able to bear weight and ambulate but has pain when doing so.  Denies any numbness or paresthesias of RLE.  Has not taken any medications for her sx.  No prior right foot/ankle injuries.  VS stable on arrival.  Past Medical History  Diagnosis Date  . Hypertension   . Stroke   . Asthma   . Diabetes mellitus   . Gout    Past Surgical History  Procedure Laterality Date  . Angioplasty      x 2 times  . Hand surgery     Family History  Problem Relation Age of Onset  . Cancer Mother     breast/ had mastecomy  . Cancer Father     colon   History  Substance Use Topics  . Smoking status: Former Smoker -- 1.50 packs/day for 34 years    Quit date: 09/24/1997  . Smokeless tobacco: Never Used  . Alcohol Use: No     Comment: socially   OB History   Grav Para Term Preterm Abortions TAB SAB Ect Mult Living   3 3 3       3      Review of Systems  Musculoskeletal: Positive for joint swelling and arthralgias.  All other systems reviewed and are negative.    Allergies  Review of patient's allergies indicates no known allergies.  Home Medications    Current Outpatient Rx  Name  Route  Sig  Dispense  Refill  . acetaminophen (TYLENOL) 500 MG tablet   Oral   Take 500 mg by mouth every 6 (six) hours as needed. For pain         . albuterol (PROVENTIL HFA;VENTOLIN HFA) 108 (90 BASE) MCG/ACT inhaler   Inhalation   Inhale 2 puffs into the lungs every 6 (six) hours as needed. For shortness of breath         . amLODipine (NORVASC) 10 MG tablet   Oral   Take 10 mg by mouth daily.           . cetirizine (ZYRTEC ALLERGY) 10 MG tablet   Oral   Take 10 mg by mouth daily.         . cloNIDine (CATAPRES) 0.3 MG tablet   Oral   Take 0.3 mg by mouth daily.          . clopidogrel (PLAVIX) 75 MG tablet   Oral   Take 1 tablet by mouth daily.         . colchicine 0.6 MG tablet   Oral   Take 0.6 mg by mouth daily.           Marland Kitchen  cyclobenzaprine (FLEXERIL) 5 MG tablet   Oral   Take 5 mg by mouth 3 (three) times daily as needed. For muscle pain         . fluocinonide gel (LIDEX) 0.05 %   Topical   Apply 1 application topically 3 (three) times daily as needed. For itching         . EXPIRED: Fluticasone-Salmeterol (ADVAIR DISKUS) 250-50 MCG/DOSE AEPB   Inhalation   Inhale 1 puff into the lungs 2 (two) times daily.   1 each   5   . furosemide (LASIX) 20 MG tablet   Oral   Take 20 mg by mouth daily.          Marland Kitchen HYDROcodone-acetaminophen (VICODIN) 5-500 MG per tablet   Oral   Take 1 tablet by mouth every 6 (six) hours as needed. For pain   30 tablet   0   . levofloxacin (LEVAQUIN) 500 MG tablet   Oral   Take 1 tablet (500 mg total) by mouth daily.   7 tablet   0   . metFORMIN (GLUCOPHAGE) 500 MG tablet   Oral   Take 250 mg by mouth 2 (two) times daily.          . metroNIDAZOLE (METROGEL VAGINAL) 0.75 % vaginal gel   Vaginal   Place 1 Applicatorful vaginally 1 day or 1 dose. At bedtime for 5 nights.   70 g   0   . mometasone (NASONEX) 50 MCG/ACT nasal spray   Nasal   Place 2 sprays into the nose as  needed. For allergies         . omeprazole (PRILOSEC) 20 MG capsule   Oral   Take 20 mg by mouth daily.           Marland Kitchen oxyCODONE-acetaminophen (PERCOCET/ROXICET) 5-325 MG per tablet   Oral   Take 1 tablet by mouth Every 6 hours as needed. For pain         . warfarin (COUMADIN) 7.5 MG tablet   Oral   Take 0.5 tablets (3.75 mg total) by mouth daily.   7 tablet   0    BP 157/69  Pulse 81  Temp(Src) 97.7 F (36.5 C) (Oral)  Resp 20  SpO2 99%  Physical Exam  Nursing note and vitals reviewed. Constitutional: She is oriented to person, place, and time. No distress.  Morbidly obese  HENT:  Head: Normocephalic and atraumatic.  Mouth/Throat: Oropharynx is clear and moist.  Eyes: Conjunctivae and EOM are normal. Pupils are equal, round, and reactive to light.  Neck: Normal range of motion.  Cardiovascular: Normal rate, regular rhythm and normal heart sounds.   Pulmonary/Chest: Effort normal and breath sounds normal. No respiratory distress. She has no wheezes.  Musculoskeletal:       Right ankle: She exhibits decreased range of motion and swelling. She exhibits no ecchymosis, no deformity, no laceration and normal pulse. Tenderness. Lateral malleolus and head of 5th metatarsal tenderness found. Achilles tendon normal.       Feet:  Right ankle with limited ROM due to pain; TTP and swelling of lateral malleolus and head of 5th without bruising or deformity; strong distal pulse and cap refill, sensation intact  Neurological: She is alert and oriented to person, place, and time.  Skin: Skin is warm and dry. She is not diaphoretic.  Psychiatric: She has a normal mood and affect.    ED Course  Procedures (including critical care time)   Date: 06/26/2013  Rate:  86  Rhythm: normal sinus rhythm  QRS Axis: normal  Intervals: normal  ST/T Wave abnormalities: normal  Conduction Disutrbances:right bundle branch block  Narrative Interpretation: RBBB, no STEMI  Old EKG Reviewed:  unchanged   Labs Review Labs Reviewed - No data to display Imaging Review Dg Ankle Complete Right  06/26/2013   CLINICAL DATA:  Neck: Foot injury. Swelling over the lateral foot. If and  EXAM: RIGHT ANKLE - COMPLETE 3+ VIEW  COMPARISON:  Right foot films 09/19/2009  FINDINGS: Extensive soft tissue swelling is present over the lateral malleolus. The ankle joint is intact. There is no significant joint effusion. A mildly displaced fracture is present at the base of the 5th metatarsal. A prominent plantar calcaneal spur is noted. There is some flattening of the plantar arch. This is likely chronic.  IMPRESSION: 1. Would and mildly displaced fracture at the base of the 5th metatarsal. 2. Extensive soft tissue swelling over the lateral malleolus without additional fracture.   Electronically Signed   By: Gennette Pac M.D.   On: 06/26/2013 10:11   Dg Foot Complete Right  06/26/2013   CLINICAL DATA:  Injury. Lateral foot pain.  EXAM: RIGHT FOOT COMPLETE - 3+ VIEW  COMPARISON:  Ankle films of the same day.  FINDINGS: A mildly displaced fracture is present at the base of the 5th metatarsal. There is associated soft tissue swelling. The ankle is intact. A prominent plantar calcaneal spurs noted. There is some flattening of the normal plantar arch. No additional fractures are present.  IMPRESSION: 1. Mildly displaced fracture at the base of the 5th metatarsal.   Electronically Signed   By: Gennette Pac M.D.   On: 06/26/2013 10:13    MDM   1. Fracture of 5th metatarsal, right, closed, initial encounter    Triage note states the patient complains of shortness of breath, however when questioned about this pt states that EMS told her she appeared somewhat short of breath.  She denies any sensation of SOB, chest pain, or palpitations.  BP noted to be 208/112 en route, 157/96 on arrival.    X-ray as above-- mildly displaced fracture at base of 5th metatarsal.  Pt will be placed in a posterior splint with ortho  FU next week.  Pt is on home percocet already, instructed to continue taking this.  Discussed plan with pt, she agreed.  Return precautions advised.  Garlon Hatchet, PA-C 06/26/13 1250

## 2013-06-26 NOTE — ED Notes (Signed)
Per EMS pt from home with c/o right foot/ankle pain after twisting it. Pt also found to be HTN. BP 208/112. Pt c/o shortness of breath. Swelling noted to right foot. Sugar 180.

## 2013-06-26 NOTE — ED Notes (Signed)
Ortho Tech at bedside.  

## 2013-06-26 NOTE — ED Notes (Signed)
Ortho contacted to bring crutches and to splint leg.

## 2013-06-26 NOTE — Progress Notes (Signed)
Orthopedic Tech Progress Note Patient Details:  Teresa Kerr Jan 30, 1951 [id number] Post short leg splint applied to Left LE. Application tolerated well. Care instructions explained. Crutches fit for height and comfort.  Ortho Devices Type of Ortho Device: Crutches;Post (short leg) splint Ortho Device/Splint Location: Right LE Ortho Device/Splint Interventions: Application   Asia R Thompson 06/26/2013, 11:04 AM

## 2013-06-26 NOTE — ED Notes (Signed)
Patient transported to X-ray 

## 2013-06-26 NOTE — ED Provider Notes (Signed)
Pt with slip and injury to the RLE at the ankle and foot - on exam she has swelling and ttp over the mid foot, the lateral foot at the base of the fifth and with ROM of the ankle.  Pulses intact, normal Cap Refill time.  xrays confirmed by m yself to show a minimally displaced fracture at the base of the 5th, no other obvious injuries - posterior splint and f/u with ortho - RICE therapy.  Medical screening examination/treatment/procedure(s) were conducted as a shared visit with non-physician practitioner(s) and myself.  I personally evaluated the patient during the encounter.   Vida Roller, MD 06/29/13 [phone]

## 2013-06-29 NOTE — ED Provider Notes (Signed)
Medical screening examination/treatment/procedure(s) were conducted as a shared visit with non-physician practitioner(s) and myself.  I personally evaluated the patient during the encounter  Please see my separate respective documentation pertaining to this patient encounter   Vida Roller, MD 06/29/13 [phone]

## 2013-10-08 ENCOUNTER — Encounter (HOSPITAL_COMMUNITY): Payer: Self-pay | Admitting: Family Medicine

## 2013-10-08 ENCOUNTER — Emergency Department (INDEPENDENT_AMBULATORY_CARE_PROVIDER_SITE_OTHER)
Admission: EM | Admit: 2013-10-08 | Discharge: 2013-10-08 | Disposition: A | Discharge status: Home / Self Care | Payer: PRIVATE HEALTH INSURANCE | Source: Home / Self Care

## 2013-10-08 DIAGNOSIS — R519 Headache, unspecified: Secondary | ICD-10-CM

## 2013-10-08 DIAGNOSIS — R51 Headache: Secondary | ICD-10-CM

## 2013-10-08 MED ORDER — ONDANSETRON HCL 4 MG/2ML IJ SOLN
INTRAMUSCULAR | Status: AC
  Filled 2013-10-08: qty 2

## 2013-10-08 MED ORDER — DEXAMETHASONE SODIUM PHOSPHATE 10 MG/ML IJ SOLN
10.0000 mg | Freq: Once | INTRAMUSCULAR | Status: AC
  Administered 2013-10-08: 10 mg via INTRAMUSCULAR

## 2013-10-08 MED ORDER — SUMATRIPTAN SUCCINATE 6 MG/0.5ML ~~LOC~~ SOLN
SUBCUTANEOUS | Status: AC
  Filled 2013-10-08: qty 0.5

## 2013-10-08 MED ORDER — SUMATRIPTAN SUCCINATE 6 MG/0.5ML ~~LOC~~ SOLN
6.0000 mg | Freq: Once | SUBCUTANEOUS | Status: AC
  Administered 2013-10-08: 6 mg via SUBCUTANEOUS

## 2013-10-08 MED ORDER — ONDANSETRON HCL 4 MG/2ML IJ SOLN
4.0000 mg | Freq: Once | INTRAMUSCULAR | Status: AC
  Administered 2013-10-08: 4 mg via INTRAMUSCULAR

## 2013-10-08 MED ORDER — DEXAMETHASONE SODIUM PHOSPHATE 10 MG/ML IJ SOLN
INTRAMUSCULAR | Status: AC
  Filled 2013-10-08: qty 1

## 2013-10-08 NOTE — Discharge Instructions (Signed)
You are having a headache that was treated in our clinic tonight with injectable medicine You should get significant relief from this medicine I do not see any signs of stroke tonight Please go to the emergency room if you develop any difficult speaking, change in mentation, weakenss, or worsening of the headache Please follow up with your doctor as needed.

## 2013-10-08 NOTE — ED Notes (Signed)
C/o headache which started this morning Tylenol was used but no relief.

## 2013-10-08 NOTE — ED Provider Notes (Signed)
CSN: [id number]     Arrival date & time 10/08/13  1651 History   None    Chief Complaint  Patient presents with  . Headache   (Consider location/radiation/quality/duration/timing/severity/associated sxs/prior Treatment) HPI  Headache: started this morning. Associated w/ dizziness. Denies photophobia or phonophobia. Emesis x1, 2 hours ago about 30 min after taking tylenol on empty stomach. Denies CP, SOB, fevers, diarrhea, loss of bowels or bladder function, syncope, seizure, nausea. 2 tylenol w/o benefit. Tolerating PO.  No change in sensation or strength. HA is ache. On coumadin due to stroke. Denies falls or trauma  H/o stroke in 2002  Past Medical History  Diagnosis Date  . Hypertension   . Stroke   . Asthma   . Diabetes mellitus   . Gout    Past Surgical History  Procedure Laterality Date  . Angioplasty      x 2 times  . Hand surgery     Family History  Problem Relation Age of Onset  . Cancer Mother     breast/ had mastecomy  . Cancer Father     colon   History  Substance Use Topics  . Smoking status: Former Smoker -- 1.50 packs/day for 34 years    Quit date: 09/24/1997  . Smokeless tobacco: Never Used  . Alcohol Use: 1.2 oz/week    2 Glasses of wine per week     Comment: socially   OB History   Grav Para Term Preterm Abortions TAB SAB Ect Mult Living   3 3 3       3      Review of Systems  Constitutional: Negative for fever, appetite change and fatigue.  Neurological: Positive for dizziness and headaches. Negative for tremors, seizures, syncope, facial asymmetry, speech difficulty, weakness, light-headedness and numbness.  Psychiatric/Behavioral: Negative for confusion, dysphoric mood, decreased concentration and agitation. The patient is not nervous/anxious.   All other systems reviewed and are negative.    Allergies  Review of patient's allergies indicates no known allergies.  Home Medications   Current Outpatient Rx  Name  Route  Sig  Dispense   Refill  . albuterol (PROVENTIL HFA;VENTOLIN HFA) 108 (90 BASE) MCG/ACT inhaler   Inhalation   Inhale 2 puffs into the lungs every 6 (six) hours as needed. For shortness of breath         . amLODipine (NORVASC) 10 MG tablet   Oral   Take 10 mg by mouth daily.           . cloNIDine (CATAPRES) 0.3 MG tablet   Oral   Take 0.3 mg by mouth 2 (two) times daily.          . clopidogrel (PLAVIX) 75 MG tablet   Oral   Take 75 mg by mouth daily.          . colchicine 0.6 MG tablet   Oral   Take 0.6 mg by mouth daily.           . cyclobenzaprine (FLEXERIL) 5 MG tablet   Oral   Take 5 mg by mouth 3 (three) times daily as needed. For muscle pain         . Fluticasone-Salmeterol (ADVAIR) 250-50 MCG/DOSE AEPB   Inhalation   Inhale 1 puff into the lungs every 12 (twelve) hours.         . furosemide (LASIX) 20 MG tablet   Oral   Take 20 mg by mouth daily.          Marland Kitchen  metFORMIN (GLUCOPHAGE) 500 MG tablet   Oral   Take 250 mg by mouth 2 (two) times daily.          . mometasone (NASONEX) 50 MCG/ACT nasal spray   Nasal   Place 2 sprays into the nose as needed. For allergies         . oxyCODONE-acetaminophen (PERCOCET/ROXICET) 5-325 MG per tablet   Oral   Take 1 tablet by mouth Every 6 hours as needed. For pain         . warfarin (COUMADIN) 7.5 MG tablet   Oral   Take 7.5 mg by mouth daily.          BP 154/82  Pulse 68  Temp(Src) 97.5 F (36.4 C) (Oral)  Resp 16  SpO2 98% Physical Exam  Constitutional: She is oriented to person, place, and time. She appears well-developed and well-nourished. No distress.  HENT:  Head: Atraumatic.  Eyes: EOM are normal. Pupils are equal, round, and reactive to light.  Optic disc margins sharp  Neck: Normal range of motion. Neck supple.  Cardiovascular: Normal rate, normal heart sounds and intact distal pulses.   Pulmonary/Chest: Effort normal. No respiratory distress.  Abdominal: Soft.  Musculoskeletal: Normal range of  motion. She exhibits edema.  Neurological: She is alert and oriented to person, place, and time. No cranial nerve deficit. She exhibits normal muscle tone. Coordination normal.  Skin: Skin is warm. No erythema.  Psychiatric: She has a normal mood and affect. Her behavior is normal. Judgment and thought content normal.    ED Course  Procedures (including critical care time) Labs Review Labs Reviewed - No data to display Imaging Review No results found.  EKG Interpretation    Date/Time:    Ventricular Rate:    PR Interval:    QRS Duration:   QT Interval:    QTC Calculation:   R Axis:     Text Interpretation:              MDM   1. Headache    63yo F w/ complex medical history w/ HA, likely migraine variant. No sign of intracranial process, stroke, metabolic derangement.  - Zofran, Imitrex, and decadron 10mg  given in office - precautions given and pt counseled extensively on reasons to call 911 or return to ED  Linna Darner, MD Family Medicine PGY-3 10/08/2013, 5:38 PM      Waldemar Dickens, MD 10/08/13 [phone]

## 2013-10-08 NOTE — ED Provider Notes (Signed)
Medical screening examination/treatment/procedure(s) were performed by resident physician or non-physician practitioner and as supervising physician I was immediately available for consultation/collaboration.   Pauline Good MD.   Billy Fischer, MD 10/08/13 2056

## 2013-10-10 ENCOUNTER — Encounter (HOSPITAL_COMMUNITY): Payer: Self-pay | Admitting: Emergency Medicine

## 2013-10-10 ENCOUNTER — Emergency Department (HOSPITAL_COMMUNITY): Payer: PRIVATE HEALTH INSURANCE

## 2013-10-10 ENCOUNTER — Emergency Department (HOSPITAL_COMMUNITY)
Admission: EM | Admit: 2013-10-10 | Discharge: 2013-10-10 | Disposition: A | Discharge status: Home / Self Care | Payer: PRIVATE HEALTH INSURANCE | Attending: Emergency Medicine | Admitting: Emergency Medicine

## 2013-10-10 DIAGNOSIS — R791 Abnormal coagulation profile: Secondary | ICD-10-CM

## 2013-10-10 DIAGNOSIS — Z79899 Other long term (current) drug therapy: Secondary | ICD-10-CM | POA: Insufficient documentation

## 2013-10-10 DIAGNOSIS — R51 Headache: Secondary | ICD-10-CM | POA: Insufficient documentation

## 2013-10-10 DIAGNOSIS — R209 Unspecified disturbances of skin sensation: Secondary | ICD-10-CM | POA: Insufficient documentation

## 2013-10-10 DIAGNOSIS — J3489 Other specified disorders of nose and nasal sinuses: Secondary | ICD-10-CM | POA: Insufficient documentation

## 2013-10-10 DIAGNOSIS — R7309 Other abnormal glucose: Secondary | ICD-10-CM

## 2013-10-10 DIAGNOSIS — E119 Type 2 diabetes mellitus without complications: Secondary | ICD-10-CM | POA: Insufficient documentation

## 2013-10-10 DIAGNOSIS — R109 Unspecified abdominal pain: Secondary | ICD-10-CM | POA: Insufficient documentation

## 2013-10-10 DIAGNOSIS — Z7901 Long term (current) use of anticoagulants: Secondary | ICD-10-CM | POA: Insufficient documentation

## 2013-10-10 DIAGNOSIS — Z8673 Personal history of transient ischemic attack (TIA), and cerebral infarction without residual deficits: Secondary | ICD-10-CM | POA: Insufficient documentation

## 2013-10-10 DIAGNOSIS — J45909 Unspecified asthma, uncomplicated: Secondary | ICD-10-CM | POA: Insufficient documentation

## 2013-10-10 DIAGNOSIS — Z87891 Personal history of nicotine dependence: Secondary | ICD-10-CM | POA: Insufficient documentation

## 2013-10-10 DIAGNOSIS — R519 Headache, unspecified: Secondary | ICD-10-CM

## 2013-10-10 DIAGNOSIS — I10 Essential (primary) hypertension: Secondary | ICD-10-CM | POA: Insufficient documentation

## 2013-10-10 DIAGNOSIS — R42 Dizziness and giddiness: Secondary | ICD-10-CM

## 2013-10-10 LAB — CBC
HCT: 41 % (ref 36.0–46.0)
Hemoglobin: 14 g/dL (ref 12.0–15.0)
MCH: 29.9 pg (ref 26.0–34.0)
MCHC: 34.1 g/dL (ref 30.0–36.0)
MCV: 87.6 fL (ref 78.0–100.0)
PLATELETS: 290 10*3/uL (ref 150–400)
RBC: 4.68 MIL/uL (ref 3.87–5.11)
RDW: 13.8 % (ref 11.5–15.5)
WBC: 10.8 10*3/uL — ABNORMAL HIGH (ref 4.0–10.5)

## 2013-10-10 LAB — URINE MICROSCOPIC-ADD ON

## 2013-10-10 LAB — URINALYSIS, ROUTINE W REFLEX MICROSCOPIC
BILIRUBIN URINE: NEGATIVE
GLUCOSE, UA: 500 mg/dL — AB
Ketones, ur: NEGATIVE mg/dL
Nitrite: POSITIVE — AB
PH: 5 (ref 5.0–8.0)
Protein, ur: NEGATIVE mg/dL
SPECIFIC GRAVITY, URINE: 1.018 (ref 1.005–1.030)
UROBILINOGEN UA: 0.2 mg/dL (ref 0.0–1.0)

## 2013-10-10 LAB — COMPREHENSIVE METABOLIC PANEL
ALBUMIN: 3.3 g/dL — AB (ref 3.5–5.2)
ALT: 25 U/L (ref 0–35)
AST: 16 U/L (ref 0–37)
Alkaline Phosphatase: 69 U/L (ref 39–117)
BUN: 23 mg/dL (ref 6–23)
CALCIUM: 8.7 mg/dL (ref 8.4–10.5)
CO2: 27 meq/L (ref 19–32)
Chloride: 97 mEq/L (ref 96–112)
Creatinine, Ser: 0.91 mg/dL (ref 0.50–1.10)
GFR calc Af Amer: 78 mL/min — ABNORMAL LOW (ref >90)
GFR, EST NON AFRICAN AMERICAN: 67 mL/min — AB (ref >90)
Glucose, Bld: 353 mg/dL — ABNORMAL HIGH (ref 70–99)
Potassium: 4 mEq/L (ref 3.7–5.3)
SODIUM: 139 meq/L (ref 137–147)
Total Bilirubin: 0.3 mg/dL (ref 0.3–1.2)
Total Protein: 7.1 g/dL (ref 6.0–8.3)

## 2013-10-10 LAB — PROTIME-INR
INR: 1.08 (ref 0.00–1.49)
PROTHROMBIN TIME: 13.8 s (ref 11.6–15.2)

## 2013-10-10 MED ORDER — KETOROLAC TROMETHAMINE 30 MG/ML IJ SOLN
30.0000 mg | Freq: Once | INTRAMUSCULAR | Status: AC
  Administered 2013-10-10: 30 mg via INTRAVENOUS
  Filled 2013-10-10: qty 1

## 2013-10-10 MED ORDER — SUMATRIPTAN SUCCINATE 6 MG/0.5ML ~~LOC~~ SOLN
6.0000 mg | Freq: Once | SUBCUTANEOUS | Status: AC
  Administered 2013-10-10: 6 mg via SUBCUTANEOUS
  Filled 2013-10-10: qty 0.5

## 2013-10-10 MED ORDER — METOCLOPRAMIDE HCL 5 MG/ML IJ SOLN
10.0000 mg | Freq: Once | INTRAMUSCULAR | Status: AC
  Administered 2013-10-10: 10 mg via INTRAVENOUS
  Filled 2013-10-10: qty 2

## 2013-10-10 MED ORDER — SODIUM CHLORIDE 0.9 % IV BOLUS (SEPSIS)
1000.0000 mL | Freq: Once | INTRAVENOUS | Status: AC
  Administered 2013-10-10: 1000 mL via INTRAVENOUS

## 2013-10-10 MED ORDER — DIPHENHYDRAMINE HCL 50 MG/ML IJ SOLN
25.0000 mg | Freq: Once | INTRAMUSCULAR | Status: AC
  Administered 2013-10-10: 25 mg via INTRAVENOUS
  Filled 2013-10-10: qty 1

## 2013-10-10 MED ORDER — CEFTRIAXONE SODIUM 1 G IJ SOLR
1.0000 g | Freq: Once | INTRAMUSCULAR | Status: AC
  Administered 2013-10-10: 1 g via INTRAVENOUS
  Filled 2013-10-10: qty 10

## 2013-10-10 MED ORDER — CIPROFLOXACIN HCL 500 MG PO TABS: 500.0000 mg | ORAL_TABLET | Freq: Two times a day (BID) | ORAL | Status: DC

## 2013-10-10 NOTE — Discharge Instructions (Signed)
Call for a follow up appointment with a Family or Primary Care Provider and a neurologist. Talk to your doctor about your INR levels and your coumadin therapy. Return if Symptoms worsen.   Take medication as prescribed.

## 2013-10-10 NOTE — ED Provider Notes (Signed)
CSN: [id number]     Arrival date & time 10/10/13  1253 History   First MD Initiated Contact with Patient 10/10/13 1304     Chief Complaint  Patient presents with  . Headache   (Consider location/radiation/quality/duration/timing/severity/associated sxs/prior Treatment) HPI Comments: Teresa Kerr is a 63 year-old female with a past medical history of stroke, HTN, DM, Gout, Asthma, presenting the Emergency Department with a chief complaint of headache since this morning.  She reports she was evaluated by Urgent Care 2 days ago for similar complaints.  She reports her symptoms resolved after receiving medication.  Upon waking today she reports her frontal head ache was back. She describes the discomfort as non-radiating, constant, frontal, dull pain.  She states she has had associated lightheadedness without syncope.  Denies photophobia, double vision, blurry vision, or vision changes.  She reports 2 episodes of vomiting 2 days ago. The pateint reports lower abdominal pain since yesterday. Denies fever or chills, denies nausea in ED.   The history is provided by the patient, the police and medical records. No language interpreter was used.    Past Medical History  Diagnosis Date  . Hypertension   . Asthma   . Diabetes mellitus   . Gout   . Stroke     pt has right sided weakness at times, gets ESI when weakness returns   Past Surgical History  Procedure Laterality Date  . Angioplasty      x 2 times  . Hand surgery     Family History  Problem Relation Age of Onset  . Cancer Mother     breast/ had mastecomy  . Cancer Father     colon   History  Substance Use Topics  . Smoking status: Former Smoker -- 1.50 packs/day for 34 years    Quit date: 09/24/1997  . Smokeless tobacco: Never Used  . Alcohol Use: 1.2 oz/week    2 Glasses of wine per week     Comment: socially   OB History   Grav Para Term Preterm Abortions TAB SAB Ect Mult Living   3 3 3       3      Review of  Systems  Constitutional: Negative for fever and chills.  HENT: Positive for rhinorrhea. Negative for congestion, dental problem and ear pain.   Eyes: Negative for photophobia, pain and visual disturbance.  Respiratory: Negative for cough.   Cardiovascular: Negative for chest pain, palpitations and leg swelling.  Gastrointestinal: Positive for abdominal pain. Negative for diarrhea and constipation.  Genitourinary: Negative for dysuria.  Musculoskeletal: Negative for neck pain and neck stiffness.  Skin: Negative for rash.  Neurological: Positive for facial asymmetry (Chronic, from previous CVA), light-headedness and headaches. Negative for dizziness, tremors, seizures, syncope, speech difficulty, weakness and numbness.  All other systems reviewed and are negative.    Allergies  Review of patient's allergies indicates no known allergies.  Home Medications   Current Outpatient Rx  Name  Route  Sig  Dispense  Refill  . acetaminophen (TYLENOL) 325 MG tablet   Oral   Take 325-650 mg by mouth every 6 (six) hours as needed for mild pain, moderate pain, fever or headache.         . albuterol (PROVENTIL HFA;VENTOLIN HFA) 108 (90 BASE) MCG/ACT inhaler   Inhalation   Inhale 2 puffs into the lungs every 6 (six) hours as needed. For shortness of breath         . amLODipine (NORVASC) 10 MG tablet  Oral   Take 10 mg by mouth daily.           . cloNIDine (CATAPRES) 0.3 MG tablet   Oral   Take 0.3 mg by mouth 2 (two) times daily.          . clopidogrel (PLAVIX) 75 MG tablet   Oral   Take 75 mg by mouth daily.          . colchicine 0.6 MG tablet   Oral   Take 0.6 mg by mouth daily.           . cyclobenzaprine (FLEXERIL) 5 MG tablet   Oral   Take 5 mg by mouth 3 (three) times daily as needed. For muscle pain         . Fluticasone-Salmeterol (ADVAIR) 250-50 MCG/DOSE AEPB   Inhalation   Inhale 1 puff into the lungs every 12 (twelve) hours.         . furosemide (LASIX)  20 MG tablet   Oral   Take 20 mg by mouth daily.          . metFORMIN (GLUCOPHAGE) 500 MG tablet   Oral   Take 250 mg by mouth 2 (two) times daily.          . mometasone (NASONEX) 50 MCG/ACT nasal spray   Nasal   Place 2 sprays into the nose as needed. For allergies         . oxyCODONE-acetaminophen (PERCOCET/ROXICET) 5-325 MG per tablet   Oral   Take 1 tablet by mouth Every 6 hours as needed. For pain         . warfarin (COUMADIN) 7.5 MG tablet   Oral   Take 7.5 mg by mouth daily.         . ciprofloxacin (CIPRO) 500 MG tablet   Oral   Take 1 tablet (500 mg total) by mouth 2 (two) times daily.   14 tablet   0    BP 129/74  Pulse 59  Temp(Src) 98.2 F (36.8 C) (Oral)  Resp 18  SpO2 99% Physical Exam  Nursing note and vitals reviewed. Constitutional: She is oriented to person, place, and time. She appears well-developed and well-nourished. No distress.  Exam limited by patient's body habitus.    HENT:  Head: Normocephalic and atraumatic.  Right Ear: Tympanic membrane, external ear and ear canal normal.  Left Ear: Tympanic membrane, external ear and ear canal normal.  Nose: Rhinorrhea present. Right sinus exhibits no maxillary sinus tenderness and no frontal sinus tenderness. Left sinus exhibits no maxillary sinus tenderness and no frontal sinus tenderness.  Eyes: EOM are normal. Pupils are equal, round, and reactive to light. Right eye exhibits no discharge. Left eye exhibits no discharge. No scleral icterus.  Neck: Normal range of motion. Neck supple.  Abdominal: Soft. There is tenderness in the suprapubic area. There is no rebound, no guarding, no CVA tenderness, no tenderness at McBurney's point and negative Murphy's sign.  Neurological: She is alert and oriented to person, place, and time. She is not disoriented. No sensory deficit. She exhibits normal muscle tone. Coordination and gait normal. GCS eye subscore is 4. GCS verbal subscore is 5. GCS motor  subscore is 6.  Reflex Scores:      Brachioradialis reflexes are 3+ on the right side and 3+ on the left side.      Patellar reflexes are 3+ on the right side and 3+ on the left side.      Achilles reflexes  are 2+ on the right side and 2+ on the left side. Mild Right sided facial asymmetry.   Skin: Skin is warm and dry. No rash noted.  Psychiatric: She has a normal mood and affect. Her behavior is normal. Thought content normal.    ED Course  Procedures (including critical care time) Labs Review Labs Reviewed  CBC - Abnormal; Notable for the following:    WBC 10.8 (*)    All other components within normal limits  COMPREHENSIVE METABOLIC PANEL - Abnormal; Notable for the following:    Glucose, Bld 353 (*)    Albumin 3.3 (*)    GFR calc non Af Amer 67 (*)    GFR calc Af Amer 78 (*)    All other components within normal limits  URINALYSIS, ROUTINE W REFLEX MICROSCOPIC - Abnormal; Notable for the following:    APPearance CLOUDY (*)    Glucose, UA 500 (*)    Hgb urine dipstick TRACE (*)    Nitrite POSITIVE (*)    Leukocytes, UA LARGE (*)    All other components within normal limits  URINE MICROSCOPIC-ADD ON - Abnormal; Notable for the following:    Bacteria, UA MANY (*)    All other components within normal limits  URINE CULTURE  PROTIME-INR   Imaging Review Ct Head Wo Contrast  10/10/2013   CLINICAL DATA:  Headache for 2 days  EXAM: CT HEAD WITHOUT CONTRAST  TECHNIQUE: Contiguous axial images were obtained from the base of the skull through the vertex without intravenous contrast.  COMPARISON:  MRI of the brain performed 03/10/2010  FINDINGS: There are bands of encephalomalacia at the watershed margins of the left middle cerebral artery territory. There is no abnormal attenuation that would suggest acute infarct. There is no hemorrhage or extra-axial fluid. There is no evidence of mass or hydrocephalus. The calvarium is intact. There is no significant opacification involving the  limited portions of the sinuses included on this study.  IMPRESSION: Areas of chronic ischemic change at the watershed anterior and posterior margins of the middle cerebral artery territory on the left. No acute abnormalities.   Electronically Signed   By: Skipper Cliche M.D.   On: 10/10/2013 15:43    EKG Interpretation   None       MDM   1. Headache   2. Elevated glucose   3. Light headedness   4. Supratherapeutic INR    Pt reports history of stroke and presents with headache, mild right sided facial droop, she reports is constant.  EMR shows she was given decadron 10mg , imitrex 6 mg and zofran 4mg  at Urgent care on 10/08/2013. Patient is on coumadin and plavix, will CT to evaluate for CVA. Discussed patient history, condition, and labs with Dr. Tamera Punt who agrees on current evaluation of the patient.  1700: Re-eval: Patient reports 7/10 discomfort.CMP shows elevated glucose, history of DM and recent decadron injection. CT without acute process. UA shows infection-will treat as out patient and start on keflex in the ED. Re-eval Patient reports 7/10 headache. Imitex ordered.  RE-eval: Pt reports lightheadedness with sitting up, reports pain 5/10. Patient reports symptom improvement, denies lightheadedness.  Patient is able to ambulate without lightheadedness. Discussed lab results, imaging results, and treatment plan with the patient. Return precautions given. Reports understanding and no other concerns at this time.  Patient is stable for discharge at this time.   Meds given in ED:  Medications  metoCLOPramide (REGLAN) injection 10 mg (10 mg Intravenous Given 10/10/13 1619)  diphenhydrAMINE (BENADRYL) injection 25 mg (25 mg Intravenous Given 10/10/13 1620)  ketorolac (TORADOL) 30 MG/ML injection 30 mg (30 mg Intravenous Given 10/10/13 1739)  sodium chloride 0.9 % bolus 1,000 mL (1,000 mLs Intravenous New Bag/Given 10/10/13 1816)  cefTRIAXone (ROCEPHIN) 1 g in dextrose 5 % 50 mL IVPB (0 g  Intravenous Stopped 10/10/13 1916)  SUMAtriptan (IMITREX) injection 6 mg (6 mg Subcutaneous Given 10/10/13 1912)    New Prescriptions   CIPROFLOXACIN (CIPRO) 500 MG TABLET    Take 1 tablet (500 mg total) by mouth 2 (two) times daily.       Lorrine Kin, PA-C 10/10/13 2048

## 2013-10-10 NOTE — ED Notes (Signed)
Pt c/o headache ongoing since Thursday.  Pt states she was seen/treated at [facility] on Thursday.

## 2013-10-11 NOTE — ED Provider Notes (Signed)
Medical screening examination/treatment/procedure(s) were performed by non-physician practitioner and as supervising physician I was immediately available for consultation/collaboration.  EKG Interpretation   None         Malvin Johns, MD 10/11/13 1101

## 2013-10-13 LAB — URINE CULTURE: Colony Count: >=100000

## 2013-10-14 NOTE — ED Notes (Signed)
+   Urine Patient treated per protocol MD.

## 2014-04-10 ENCOUNTER — Emergency Department (INDEPENDENT_AMBULATORY_CARE_PROVIDER_SITE_OTHER)
Admission: EM | Admit: 2014-04-10 | Discharge: 2014-04-10 | Disposition: A | Discharge status: Home / Self Care | Payer: PRIVATE HEALTH INSURANCE | Source: Home / Self Care | Attending: Emergency Medicine | Admitting: Emergency Medicine

## 2014-04-10 ENCOUNTER — Encounter (HOSPITAL_COMMUNITY): Payer: Self-pay | Admitting: Emergency Medicine

## 2014-04-10 DIAGNOSIS — K429 Umbilical hernia without obstruction or gangrene: Secondary | ICD-10-CM

## 2014-04-10 DIAGNOSIS — R1033 Periumbilical pain: Secondary | ICD-10-CM

## 2014-04-10 HISTORY — DX: Leiomyoma of uterus, unspecified: D25.9

## 2014-04-10 LAB — LIPASE, BLOOD: Lipase: 55 U/L (ref 11–59)

## 2014-04-10 LAB — POCT URINALYSIS DIP (DEVICE)
BILIRUBIN URINE: NEGATIVE
Glucose, UA: 100 mg/dL — AB
Ketones, ur: NEGATIVE mg/dL
LEUKOCYTES UA: NEGATIVE
NITRITE: NEGATIVE
Protein, ur: NEGATIVE mg/dL
Specific Gravity, Urine: 1.025 (ref 1.005–1.030)
Urobilinogen, UA: 0.2 mg/dL (ref 0.0–1.0)
pH: 5 (ref 5.0–8.0)

## 2014-04-10 LAB — CBC WITH DIFFERENTIAL/PLATELET
BASOS ABS: 0 10*3/uL (ref 0.0–0.1)
Basophils Relative: 0 % (ref 0–1)
Eosinophils Absolute: 0.1 10*3/uL (ref 0.0–0.7)
Eosinophils Relative: 1 % (ref 0–5)
HEMATOCRIT: 41.7 % (ref 36.0–46.0)
HEMOGLOBIN: 14 g/dL (ref 12.0–15.0)
Lymphocytes Relative: 31 % (ref 12–46)
Lymphs Abs: 3 10*3/uL (ref 0.7–4.0)
MCH: 29 pg (ref 26.0–34.0)
MCHC: 33.6 g/dL (ref 30.0–36.0)
MCV: 86.3 fL (ref 78.0–100.0)
MONO ABS: 0.6 10*3/uL (ref 0.1–1.0)
MONOS PCT: 7 % (ref 3–12)
NEUTROS ABS: 5.8 10*3/uL (ref 1.7–7.7)
Neutrophils Relative %: 61 % (ref 43–77)
Platelets: 295 10*3/uL (ref 150–400)
RBC: 4.83 MIL/uL (ref 3.87–5.11)
RDW: 12.8 % (ref 11.5–15.5)
WBC: 9.5 10*3/uL (ref 4.0–10.5)

## 2014-04-10 LAB — COMPREHENSIVE METABOLIC PANEL
ALBUMIN: 3.2 g/dL — AB (ref 3.5–5.2)
ALT: 25 U/L (ref 0–35)
AST: 20 U/L (ref 0–37)
Alkaline Phosphatase: 77 U/L (ref 39–117)
Anion gap: 14 (ref 5–15)
BILIRUBIN TOTAL: 0.3 mg/dL (ref 0.3–1.2)
BUN: 14 mg/dL (ref 6–23)
CHLORIDE: 100 meq/L (ref 96–112)
CO2: 27 mEq/L (ref 19–32)
Calcium: 8.8 mg/dL (ref 8.4–10.5)
Creatinine, Ser: 0.75 mg/dL (ref 0.50–1.10)
GFR calc Af Amer: >90 mL/min (ref >90)
GFR calc non Af Amer: 90 mL/min — ABNORMAL LOW (ref >90)
Glucose, Bld: 241 mg/dL — ABNORMAL HIGH (ref 70–99)
Potassium: 4.2 mEq/L (ref 3.7–5.3)
Sodium: 141 mEq/L (ref 137–147)
Total Protein: 7.2 g/dL (ref 6.0–8.3)

## 2014-04-10 MED ORDER — TRAMADOL HCL 50 MG PO TABS: 50.0000 mg | ORAL_TABLET | Freq: Four times a day (QID) | ORAL | Status: DC | PRN

## 2014-04-10 NOTE — ED Notes (Signed)
C/O constant, "nagging", "pulling" periumbilical pain, and pain across mid abdomen x 3 days without n/v/d.  Last BM this morning - normal per pt.  Denies constipation or fevers.  Appetite normal.  Has tried gas pills and Alka Seltzer without any change.

## 2014-04-10 NOTE — ED Provider Notes (Signed)
CSN: [id number]     Arrival date & time 04/10/14  0930 History   First MD Initiated Contact with Patient 04/10/14 1007     Chief Complaint  Patient presents with  . Abdominal Pain   (Consider location/radiation/quality/duration/timing/severity/associated sxs/prior Treatment) HPI Comments: 63 year old female presents for evaluation of abdominal pain.for 3 days she has had periumbilical abdominal pain. The pain is described as constant, with a negative or pulling sensation. She has also noticed some sort of lump at her umbilicus for the past few days, as well as feeling like her abdomen is distended. She denies any associated symptoms including no NVD, constipation, pain with bowel movements, hematuria, dysuria, flank pain. She has never had this pain before. She has no history of any abdominal surgeries. No vaginal discharge or bleeding  Patient is a 63 y.o. female presenting with abdominal pain.  Abdominal Pain Associated symptoms: no chest pain, no chills, no constipation, no cough, no diarrhea, no dysuria, no fever, no nausea, no shortness of breath and no vomiting     Past Medical History  Diagnosis Date  . Hypertension   . Asthma   . Diabetes mellitus   . Gout   . Stroke     pt has right sided weakness at times, gets ESI when weakness returns  . Uterine fibroid    Past Surgical History  Procedure Laterality Date  . Angioplasty      x 2 times  . Hand surgery    . Toe surgery     Family History  Problem Relation Age of Onset  . Cancer Mother     breast/ had mastecomy  . Cancer Father     colon   History  Substance Use Topics  . Smoking status: Former Smoker -- 1.50 packs/day for 34 years    Quit date: 09/24/1997  . Smokeless tobacco: Never Used  . Alcohol Use: No   OB History   Grav Para Term Preterm Abortions TAB SAB Ect Mult Living   3 3 3       3      Review of Systems  Constitutional: Negative for fever and chills.  Eyes: Negative for visual disturbance.   Respiratory: Negative for cough and shortness of breath.   Cardiovascular: Negative for chest pain, palpitations and leg swelling.  Gastrointestinal: Positive for abdominal pain and abdominal distention. Negative for nausea, vomiting, diarrhea, constipation and blood in stool.  Endocrine: Negative for polydipsia and polyuria.  Genitourinary: Negative for dysuria, urgency and frequency.  Musculoskeletal: Negative for arthralgias and myalgias.  Skin: Negative for rash.  Neurological: Negative for dizziness, weakness and light-headedness.  All other systems reviewed and are negative.   Allergies  Review of patient's allergies indicates no known allergies.  Home Medications   Prior to Admission medications   Medication Sig Start Date End Date Taking? Authorizing Provider  acetaminophen (TYLENOL) 325 MG tablet Take 325-650 mg by mouth every 6 (six) hours as needed for mild pain, moderate pain, fever or headache.   Yes Historical Provider, MD  albuterol (PROVENTIL HFA;VENTOLIN HFA) 108 (90 BASE) MCG/ACT inhaler Inhale 2 puffs into the lungs every 6 (six) hours as needed. For shortness of breath   Yes Historical Provider, MD  amLODipine (NORVASC) 10 MG tablet Take 10 mg by mouth daily.     Yes Historical Provider, MD  clopidogrel (PLAVIX) 75 MG tablet Take 75 mg by mouth daily.  05/01/12  Yes Historical Provider, MD  colchicine 0.6 MG tablet Take 0.6 mg by mouth  daily.     Yes Historical Provider, MD  cyclobenzaprine (FLEXERIL) 5 MG tablet Take 5 mg by mouth 3 (three) times daily as needed. For muscle pain   Yes Historical Provider, MD  Fluticasone-Salmeterol (ADVAIR) 250-50 MCG/DOSE AEPB Inhale 1 puff into the lungs every 12 (twelve) hours.   Yes Historical Provider, MD  furosemide (LASIX) 20 MG tablet Take 20 mg by mouth daily.    Yes Historical Provider, MD  metFORMIN (GLUCOPHAGE) 500 MG tablet Take 250 mg by mouth 2 (two) times daily.    Yes Historical Provider, MD  mometasone (NASONEX) 50  MCG/ACT nasal spray Place 2 sprays into the nose as needed. For allergies   Yes Historical Provider, MD  warfarin (COUMADIN) 7.5 MG tablet Take 7.5 mg by mouth daily.   Yes Historical Provider, MD  ciprofloxacin (CIPRO) 500 MG tablet Take 1 tablet (500 mg total) by mouth 2 (two) times daily. 10/10/13   Lauren Burnetta Sabin, PA-C  cloNIDine (CATAPRES) 0.3 MG tablet Take 0.3 mg by mouth 2 (two) times daily.     Historical Provider, MD  oxyCODONE-acetaminophen (PERCOCET/ROXICET) 5-325 MG per tablet Take 1 tablet by mouth Every 6 hours as needed. For pain 04/25/12   Historical Provider, MD  traMADol (ULTRAM) 50 MG tablet Take 1 tablet (50 mg total) by mouth every 6 (six) hours as needed for moderate pain. 04/10/14   Freeman Caldron Huma Imhoff, PA-C   BP 152/73  Pulse 101  Temp(Src) 98.1 F (36.7 C) (Oral)  Resp 20  SpO2 98% Physical Exam  Nursing note and vitals reviewed. Constitutional: She is oriented to person, place, and time. Vital signs are normal. She appears well-developed and well-nourished. No distress.  HENT:  Head: Normocephalic and atraumatic.  Cardiovascular: Normal rate, regular rhythm and normal heart sounds.   Pulmonary/Chest: Effort normal and breath sounds normal. No respiratory distress.  Abdominal: Soft. Bowel sounds are normal. She exhibits distension. There is tenderness in the periumbilical area. There is no rigidity, no rebound, no guarding, no tenderness at McBurney's point and negative Murphy's sign. A hernia (umbilical, reducible, tender) is present.  Activities he With medusa  Neurological: She is alert and oriented to person, place, and time. She has normal strength. Coordination normal.  Skin: Skin is warm and dry. No rash noted. She is not diaphoretic.  Psychiatric: She has a normal mood and affect. Judgment normal.    ED Course  Procedures (including critical care time) Labs Review Labs Reviewed  COMPREHENSIVE METABOLIC PANEL - Abnormal; Notable for the following:    Glucose,  Bld 241 (*)    Albumin 3.2 (*)    GFR calc non Af Amer 90 (*)    All other components within normal limits  POCT URINALYSIS DIP (DEVICE) - Abnormal; Notable for the following:    Glucose, UA 100 (*)    Hgb urine dipstick MODERATE (*)    All other components within normal limits  CBC WITH DIFFERENTIAL  LIPASE, BLOOD    Imaging Review No results found.   MDM   1. Periumbilical abdominal pain   2. Umbilical hernia without obstruction and without gangrene    Labs normal. I would have a reducible umbilical hernia, referred to general surgery for followup. Emergency department if worsening. Ultram when necessary for pain   Meds ordered this encounter  Medications  . traMADol (ULTRAM) 50 MG tablet    Sig: Take 1 tablet (50 mg total) by mouth every 6 (six) hours as needed for moderate pain.  Dispense:  15 tablet    Refill:  0    Order Specific Question:  Supervising Provider    Answer:  Jake Michaelis, DAVID C [place], PA-C 04/10/14 1448

## 2014-04-10 NOTE — Discharge Instructions (Signed)
Abdominal Pain, Women °Abdominal (stomach, pelvic, or belly) pain can be caused by many things. It is important to tell your doctor: °· The location of the pain. °· Does it come and go or is it present all the time? °· Are there things that start the pain (eating certain foods, exercise)? °· Are there other symptoms associated with the pain (fever, nausea, vomiting, diarrhea)? °All of this is helpful to know when trying to find the cause of the pain. °CAUSES  °· Stomach: virus or bacteria infection, or ulcer. °· Intestine: appendicitis (inflamed appendix), regional ileitis (Crohn's disease), ulcerative colitis (inflamed colon), irritable bowel syndrome, diverticulitis (inflamed diverticulum of the colon), or cancer of the stomach or intestine. °· Gallbladder disease or stones in the gallbladder. °· Kidney disease, kidney stones, or infection. °· Pancreas infection or cancer. °· Fibromyalgia (pain disorder). °· Diseases of the female organs: °¨ Uterus: fibroid (non-cancerous) tumors or infection. °¨ Fallopian tubes: infection or tubal pregnancy. °¨ Ovary: cysts or tumors. °¨ Pelvic adhesions (scar tissue). °¨ Endometriosis (uterus lining tissue growing in the pelvis and on the pelvic organs). °¨ Pelvic congestion syndrome (female organs filling up with blood just before the menstrual period). °¨ Pain with the menstrual period. °¨ Pain with ovulation (producing an egg). °¨ Pain with an IUD (intrauterine device, birth control) in the uterus. °¨ Cancer of the female organs. °· Functional pain (pain not caused by a disease, may improve without treatment). °· Psychological pain. °· Depression. °DIAGNOSIS  °Your doctor will decide the seriousness of your pain by doing an examination. °· Blood tests. °· X-rays. °· Ultrasound. °· CT scan (computed tomography, special type of X-ray). °· MRI (magnetic resonance imaging). °· Cultures, for infection. °· Barium enema (dye inserted in the large intestine, to better view it with  X-rays). °· Colonoscopy (looking in intestine with a lighted tube). °· Laparoscopy (minor surgery, looking in abdomen with a lighted tube). °· Major abdominal exploratory surgery (looking in abdomen with a large incision). °TREATMENT  °The treatment will depend on the cause of the pain.  °· Many cases can be observed and treated at home. °· Over-the-counter medicines recommended by your caregiver. °· Prescription medicine. °· Antibiotics, for infection. °· Birth control pills, for painful periods or for ovulation pain. °· Hormone treatment, for endometriosis. °· Nerve blocking injections. °· Physical therapy. °· Antidepressants. °· Counseling with a psychologist or psychiatrist. °· Minor or major surgery. °HOME CARE INSTRUCTIONS  °· Do not take laxatives, unless directed by your caregiver. °· Take over-the-counter pain medicine only if ordered by your caregiver. Do not take aspirin because it can cause an upset stomach or bleeding. °· Try a clear liquid diet (broth or water) as ordered by your caregiver. Slowly move to a bland diet, as tolerated, if the pain is related to the stomach or intestine. °· Have a thermometer and take your temperature several times a day, and record it. °· Bed rest and sleep, if it helps the pain. °· Avoid sexual intercourse, if it causes pain. °· Avoid stressful situations. °· Keep your follow-up appointments and tests, as your caregiver orders. °· If the pain does not go away with medicine or surgery, you may try: °¨ Acupuncture. °¨ Relaxation exercises (yoga, meditation). °¨ Group therapy. °¨ Counseling. °SEEK MEDICAL CARE IF:  °· You notice certain foods cause stomach pain. °· Your home care treatment is not helping your pain. °· You need stronger pain medicine. °· You want your IUD removed. °· You feel faint or   lightheaded.  You develop nausea and vomiting.  You develop a rash.  You are having side effects or an allergy to your medicine. SEEK IMMEDIATE MEDICAL CARE IF:   Your  pain does not go away or gets worse.  You have a fever.  Your pain is felt only in portions of the abdomen. The right side could possibly be appendicitis. The left lower portion of the abdomen could be colitis or diverticulitis.  You are passing blood in your stools (bright red or black tarry stools, with or without vomiting).  You have blood in your urine.  You develop chills, with or without a fever.  You pass out. MAKE SURE YOU:   Understand these instructions.  Will watch your condition.  Will get help right away if you are not doing well or get worse. Document Released: 07/08/2007 Document Revised: 12/03/2011 Document Reviewed: 07/28/2009 [facility] Patient Information 2015 [place], [place]. This information is not intended to replace advice given to you by your health care provider. Make sure you discuss any questions you have with your health care provider.  Hernia A hernia occurs when an internal organ pushes out through a weak spot in the abdominal wall. Hernias most commonly occur in the groin and around the navel. Hernias often can be pushed back into place (reduced). Most hernias tend to get worse over time. Some abdominal hernias can get stuck in the opening (irreducible or incarcerated hernia) and cannot be reduced. An irreducible abdominal hernia which is tightly squeezed into the opening is at risk for impaired blood supply (strangulated hernia). A strangulated hernia is a medical emergency. Because of the risk for an irreducible or strangulated hernia, surgery may be recommended to repair a hernia. CAUSES   Heavy lifting.  Prolonged coughing.  Straining to have a bowel movement.  A cut (incision) made during an abdominal surgery. HOME CARE INSTRUCTIONS   Bed rest is not required. You may continue your normal activities.  Avoid lifting more than 10 pounds (4.5 kg) or straining.  Cough gently. If you are a smoker it is best to stop. Even the best hernia repair can  break down with the continual strain of coughing. Even if you do not have your hernia repaired, a cough will continue to aggravate the problem.  Do not wear anything tight over your hernia. Do not try to keep it in with an outside bandage or truss. These can damage abdominal contents if they are trapped within the hernia sac.  Eat a normal diet.  Avoid constipation. Straining over long periods of time will increase hernia size and encourage breakdown of repairs. If you cannot do this with diet alone, stool softeners may be used. SEEK IMMEDIATE MEDICAL CARE IF:   You have a fever.  You develop increasing abdominal pain.  You feel nauseous or vomit.  Your hernia is stuck outside the abdomen, looks discolored, feels hard, or is tender.  You have any changes in your bowel habits or in the hernia that are unusual for you.  You have increased pain or swelling around the hernia.  You cannot push the hernia back in place by applying gentle pressure while lying down. MAKE SURE YOU:   Understand these instructions.  Will watch your condition.  Will get help right away if you are not doing well or get worse. Document Released: 09/10/2005 Document Revised: 12/03/2011 Document Reviewed: 04/29/2008 [facility] Patient Information 2015 [place], [place]. This information is not intended to replace advice given to you by your  health care provider. Make sure you discuss any questions you have with your health care provider. ° °

## 2014-04-10 NOTE — ED Notes (Signed)
Comfort measures provided.  States still unable to produce urine sample.

## 2014-04-11 NOTE — ED Provider Notes (Signed)
Medical screening examination/treatment/procedure(s) were performed by non-physician practitioner and as supervising physician I was immediately available for consultation/collaboration.  Philipp Deputy, M.D.  Harden Mo, MD 04/11/14 [phone]

## 2014-07-26 ENCOUNTER — Encounter (HOSPITAL_COMMUNITY): Payer: Self-pay | Admitting: Emergency Medicine

## 2014-08-24 ENCOUNTER — Other Ambulatory Visit: Payer: Self-pay | Admitting: Family Medicine

## 2015-01-11 ENCOUNTER — Emergency Department (HOSPITAL_COMMUNITY)
Admission: EM | Admit: 2015-01-11 | Discharge: 2015-01-12 | Disposition: A | Discharge status: Home / Self Care | Payer: No Typology Code available for payment source | Attending: Emergency Medicine | Admitting: Emergency Medicine

## 2015-01-11 DIAGNOSIS — Y9389 Activity, other specified: Secondary | ICD-10-CM | POA: Diagnosis not present

## 2015-01-11 DIAGNOSIS — Z7901 Long term (current) use of anticoagulants: Secondary | ICD-10-CM | POA: Insufficient documentation

## 2015-01-11 DIAGNOSIS — I10 Essential (primary) hypertension: Secondary | ICD-10-CM | POA: Insufficient documentation

## 2015-01-11 DIAGNOSIS — J45909 Unspecified asthma, uncomplicated: Secondary | ICD-10-CM | POA: Insufficient documentation

## 2015-01-11 DIAGNOSIS — E119 Type 2 diabetes mellitus without complications: Secondary | ICD-10-CM | POA: Insufficient documentation

## 2015-01-11 DIAGNOSIS — Y998 Other external cause status: Secondary | ICD-10-CM | POA: Diagnosis not present

## 2015-01-11 DIAGNOSIS — Z7902 Long term (current) use of antithrombotics/antiplatelets: Secondary | ICD-10-CM | POA: Insufficient documentation

## 2015-01-11 DIAGNOSIS — Z792 Long term (current) use of antibiotics: Secondary | ICD-10-CM | POA: Diagnosis not present

## 2015-01-11 DIAGNOSIS — Z87891 Personal history of nicotine dependence: Secondary | ICD-10-CM | POA: Insufficient documentation

## 2015-01-11 DIAGNOSIS — Z8673 Personal history of transient ischemic attack (TIA), and cerebral infarction without residual deficits: Secondary | ICD-10-CM | POA: Diagnosis not present

## 2015-01-11 DIAGNOSIS — Z86018 Personal history of other benign neoplasm: Secondary | ICD-10-CM | POA: Diagnosis not present

## 2015-01-11 DIAGNOSIS — T7421XA Adult sexual abuse, confirmed, initial encounter: Secondary | ICD-10-CM | POA: Insufficient documentation

## 2015-01-11 DIAGNOSIS — Y9301 Activity, walking, marching and hiking: Secondary | ICD-10-CM | POA: Diagnosis not present

## 2015-01-11 DIAGNOSIS — Y9289 Other specified places as the place of occurrence of the external cause: Secondary | ICD-10-CM | POA: Diagnosis not present

## 2015-01-11 DIAGNOSIS — S0081XA Abrasion of other part of head, initial encounter: Secondary | ICD-10-CM | POA: Diagnosis not present

## 2015-01-11 DIAGNOSIS — Z79899 Other long term (current) drug therapy: Secondary | ICD-10-CM | POA: Insufficient documentation
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
## 2015-01-12 ENCOUNTER — Encounter (HOSPITAL_COMMUNITY): Payer: Self-pay | Admitting: Emergency Medicine

## 2015-01-12 MED ORDER — AZITHROMYCIN 1 G PO PACK
PACK | ORAL | Status: AC
  Administered 2015-01-12: 1 g
  Filled 2015-01-12: qty 1

## 2015-01-12 MED ORDER — LINAGLIPTIN 5 MG PO TABS
5.0000 mg | ORAL_TABLET | Freq: Once | ORAL | Status: AC
  Administered 2015-01-12: 5 mg via ORAL
  Filled 2015-01-12: qty 1

## 2015-01-12 MED ORDER — ULIPRISTAL ACETATE 30 MG PO TABS
ORAL_TABLET | ORAL | Status: AC
  Filled 2015-01-12: qty 1

## 2015-01-12 MED ORDER — PROMETHAZINE HCL 25 MG PO TABS
ORAL_TABLET | ORAL | Status: AC
  Administered 2015-01-12: 75 mg
  Filled 2015-01-12: qty 3

## 2015-01-12 MED ORDER — METFORMIN HCL 500 MG PO TABS
500.0000 mg | ORAL_TABLET | Freq: Once | ORAL | Status: AC
  Administered 2015-01-12: 500 mg via ORAL
  Filled 2015-01-12: qty 1

## 2015-01-12 MED ORDER — METRONIDAZOLE 500 MG PO TABS
ORAL_TABLET | ORAL | Status: AC
Start: 2015-01-12 — End: 2015-01-12
  Administered 2015-01-12: 2000 mg
  Filled 2015-01-12: qty 4

## 2015-01-12 MED ORDER — INSULIN DETEMIR 100 UNIT/ML ~~LOC~~ SOLN
20.0000 [IU] | Freq: Once | SUBCUTANEOUS | Status: AC
  Administered 2015-01-12: 20 [IU] via SUBCUTANEOUS
  Filled 2015-01-12: qty 0.2

## 2015-01-12 MED ORDER — CEFIXIME 400 MG PO TABS
ORAL_TABLET | ORAL | Status: AC
  Administered 2015-01-12: 04:00:00
  Filled 2015-01-12: qty 1

## 2015-01-12 MED ORDER — METFORMIN HCL 500 MG PO TABS: 500.0000 mg | ORAL_TABLET | Freq: Once | ORAL | Status: DC
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
## 2015-01-12 NOTE — ED Notes (Signed)
Clarified medications with Dr. Stark Jock, patient is to receive metformin, levemir, and tradjenta.

## 2015-01-12 NOTE — ED Notes (Signed)
Sane nurse, lindsay, contacted. Eta of 1 hour.

## 2015-01-13 LAB — POC URINE PREG, ED: Preg Test, Ur: NEGATIVE

## 2015-01-28 ENCOUNTER — Encounter: Payer: Self-pay | Admitting: Family Medicine

## 2015-01-28 ENCOUNTER — Other Ambulatory Visit (HOSPITAL_COMMUNITY)
Admission: RE | Admit: 2015-01-28 | Discharge: 2015-01-28 | Disposition: A | Discharge status: Home / Self Care | Payer: Medicare Other | Source: Ambulatory Visit | Attending: Family Medicine | Admitting: Family Medicine

## 2015-01-28 ENCOUNTER — Ambulatory Visit (INDEPENDENT_AMBULATORY_CARE_PROVIDER_SITE_OTHER): Payer: Medicare Other | Admitting: Family Medicine

## 2015-01-28 VITALS — BP 139/85 | HR 90 | Temp 98.2°F | Ht 64.0 in | Wt 259.9 lb

## 2015-01-28 DIAGNOSIS — Z1151 Encounter for screening for human papillomavirus (HPV): Secondary | ICD-10-CM | POA: Diagnosis present

## 2015-01-28 DIAGNOSIS — Z01419 Encounter for gynecological examination (general) (routine) without abnormal findings: Secondary | ICD-10-CM | POA: Diagnosis present

## 2015-01-28 DIAGNOSIS — T7421XD Adult sexual abuse, confirmed, subsequent encounter: Secondary | ICD-10-CM | POA: Diagnosis not present

## 2015-01-28 DIAGNOSIS — T7421XA Adult sexual abuse, confirmed, initial encounter: Secondary | ICD-10-CM

## 2015-01-28 NOTE — Patient Instructions (Signed)
We have tested you for sexually transmitted diseases. We will call you with the results of these tests in about 1 week. We also obtained a PAP smear for cervical cancer screening. Please call with any concerns or questions.
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
## 2015-01-29 LAB — WET PREP, GENITAL
Trich, Wet Prep: NONE SEEN
YEAST WET PREP: NONE SEEN

## 2015-01-29 LAB — HEPATITIS B SURFACE ANTIGEN: Hepatitis B Surface Ag: NEGATIVE

## 2015-01-29 LAB — HEPATITIS B SURFACE ANTIBODY,QUALITATIVE: Hep B S Ab: NEGATIVE

## 2015-01-29 LAB — HEPATITIS C ANTIBODY: HCV Ab: NEGATIVE

## 2015-01-29 LAB — HIV ANTIBODY (ROUTINE TESTING W REFLEX): HIV 1&2 Ab, 4th Generation: NONREACTIVE

## 2015-01-29 LAB — GC/CHLAMYDIA PROBE AMP
CT Probe RNA: NEGATIVE
GC Probe RNA: NEGATIVE

## 2015-01-31 LAB — CYTOLOGY - PAP

## 2015-02-01 ENCOUNTER — Telehealth: Payer: Self-pay

## 2015-02-01 ENCOUNTER — Other Ambulatory Visit: Payer: Self-pay | Admitting: Family Medicine

## 2015-02-01 MED ORDER — METRONIDAZOLE 500 MG PO TABS: 500.0000 mg | ORAL_TABLET | Freq: Two times a day (BID) | ORAL | Status: DC

## 2015-02-01 NOTE — Telephone Encounter (Signed)
Attempted to contact patient. No answer. Left message stating we are calling with results, non-urgent, please call clinic.

## 2015-02-01 NOTE — Telephone Encounter (Signed)
-----   Message from Truett Mainland, DO sent at 02/01/2015 10:44 AM EDT ----- Please let pt know that her STD labs are normal.  Does have BV - prescription for flagyl sent to pharmacy.  PAP was normal.

## 2015-02-03 NOTE — Telephone Encounter (Signed)
Called patient and informed her of results. Patient verbalized understanding and stated she started flagyl today. No questions or concerns.

## 2015-02-16 ENCOUNTER — Emergency Department (INDEPENDENT_AMBULATORY_CARE_PROVIDER_SITE_OTHER)
Admission: EM | Admit: 2015-02-16 | Discharge: 2015-02-16 | Disposition: A | Discharge status: Home / Self Care | Payer: Medicare Other | Source: Home / Self Care | Attending: Family Medicine | Admitting: Family Medicine

## 2015-02-16 ENCOUNTER — Encounter (HOSPITAL_COMMUNITY): Payer: Self-pay | Admitting: Emergency Medicine

## 2015-02-16 DIAGNOSIS — L0291 Cutaneous abscess, unspecified: Secondary | ICD-10-CM | POA: Diagnosis not present

## 2015-02-16 DIAGNOSIS — L0390 Cellulitis, unspecified: Secondary | ICD-10-CM | POA: Diagnosis not present

## 2015-02-16 MED ORDER — FAMOTIDINE 20 MG PO TABS: 40.0000 mg | ORAL_TABLET | Freq: Once | ORAL | Status: DC

## 2015-02-16 MED ORDER — DIPHENHYDRAMINE HCL 25 MG PO CAPS: 50.0000 mg | ORAL_CAPSULE | Freq: Once | ORAL | Status: DC

## 2015-02-16 MED ORDER — FLUCONAZOLE 150 MG PO TABS: 150.0000 mg | ORAL_TABLET | Freq: Every day | ORAL | Status: DC

## 2015-02-16 MED ORDER — CLINDAMYCIN HCL 300 MG PO CAPS: 300.0000 mg | ORAL_CAPSULE | Freq: Three times a day (TID) | ORAL | Status: DC

## 2015-02-16 NOTE — ED Provider Notes (Signed)
CSN: [id number]     Arrival date & time 02/16/15  6378 History   First MD Initiated Contact with Patient 02/16/15 0815     Chief Complaint  Patient presents with  . Abscess   (Consider location/radiation/quality/duration/timing/severity/associated sxs/prior Treatment) HPI 4 days ago devloped painful sore on abdomen. Just inferior and right of the umbilicus. Tender to palpation. Constant. Getting worse. Purulent drainage 2 days ago with then development of central scab. Alcohol wipes without improvement. Patient is now developed a second lesion in her right groin area. Has fevers, nausea, vomiting, chest pain, shortness of breath, palpitations, rash outside of that which is delineated above.   Past Medical History  Diagnosis Date  . Hypertension   . Asthma   . Diabetes mellitus   . Gout   . Stroke     pt has right sided weakness at times, gets ESI when weakness returns  . Uterine fibroid   . Umbilical hernia    Past Surgical History  Procedure Laterality Date  . Angioplasty      x 2 times  . Hand surgery    . Toe surgery     Family History  Problem Relation Age of Onset  . Cancer Mother     breast/ had mastecomy  . Cancer Father     colon   History  Substance Use Topics  . Smoking status: Former Smoker -- 1.50 packs/day for 34 years    Quit date: 09/24/1997  . Smokeless tobacco: Never Used  . Alcohol Use: No   OB History    Gravida Para Term Preterm AB TAB SAB Ectopic Multiple Living   3 3 3       3      Review of Systems Per HPI with all other pertinent systems negative.   Allergies  Review of patient's allergies indicates no known allergies.  Home Medications   Prior to Admission medications   Medication Sig Start Date End Date Taking? Authorizing Provider  acetaminophen (TYLENOL) 325 MG tablet Take 325-650 mg by mouth every 6 (six) hours as needed for mild pain, moderate pain, fever or headache.   Yes Historical Provider, MD  amLODipine (NORVASC) 10 MG  tablet Take 10 mg by mouth daily.     Yes Historical Provider, MD  atorvastatin (LIPITOR) 10 MG tablet Take 10 mg by mouth daily at 6 PM.  01/25/15  Yes Historical Provider, MD  clopidogrel (PLAVIX) 75 MG tablet Take 75 mg by mouth daily.  05/01/12  Yes Historical Provider, MD  colchicine 0.6 MG tablet Take 0.6 mg by mouth daily.     Yes Historical Provider, MD  cyclobenzaprine (FLEXERIL) 5 MG tablet Take 5 mg by mouth 3 (three) times daily as needed. For muscle pain   Yes Historical Provider, MD  Fluticasone-Salmeterol (ADVAIR) 250-50 MCG/DOSE AEPB Inhale 1 puff into the lungs every 12 (twelve) hours.   Yes Historical Provider, MD  furosemide (LASIX) 20 MG tablet Take 20 mg by mouth daily.    Yes Historical Provider, MD  gabapentin (NEURONTIN) 300 MG capsule Take 300 mg by mouth 2 (two) times daily.   Yes Historical Provider, MD  LEVEMIR FLEXTOUCH 100 UNIT/ML Pen  12/27/14  Yes Historical Provider, MD  lisinopril (PRINIVIL,ZESTRIL) 40 MG tablet Take 40 mg by mouth daily.  01/05/15  Yes Historical Provider, MD  metroNIDAZOLE (FLAGYL) 500 MG tablet Take 1 tablet (500 mg total) by mouth 2 (two) times daily. 02/01/15  Yes Tanna Savoy Stinson, DO  oxyCODONE-acetaminophen (PERCOCET/ROXICET) 5-325 MG  per tablet Take 1 tablet by mouth Every 6 hours as needed. For pain 04/25/12  Yes Historical Provider, MD  PROAIR RESPICLICK 416 (90 BASE) MCG/ACT AEPB  10/25/14  Yes Historical Provider, MD  sitaGLIPtin-metformin (JANUMET) 50-1000 MG per tablet Take 1 tablet by mouth 2 (two) times daily with a meal.   Yes Historical Provider, MD  warfarin (COUMADIN) 7.5 MG tablet Take 7.5 mg by mouth daily.   Yes Historical Provider, MD  clindamycin (CLEOCIN) 300 MG capsule Take 1 capsule (300 mg total) by mouth 3 (three) times daily. 02/16/15   Waldemar Dickens, MD  fluconazole (DIFLUCAN) 150 MG tablet Take 1 tablet (150 mg total) by mouth daily. Repeat dose in 3 days 02/16/15   Waldemar Dickens, MD   There were no vitals taken for this  visit. Physical Exam Physical Exam  Constitutional: oriented to person, place, and time. appears well-developed and well-nourished. No distress.  HENT:  Head: Normocephalic and atraumatic.  Eyes: EOMI. PERRL.  Neck: Normal range of motion.  Cardiovascular: RRR, no m/r/g, 2+ distal pulses,  Pulmonary/Chest: Effort normal and breath sounds normal. No respiratory distress.  Abdominal: Soft. Bowel sounds are normal. NonTTP, no distension.  Musculoskeletal: Normal range of motion. Non ttp, no effusion.  Neurological: alert and oriented to person, place, and time.  Skin: 6 x 8 area of erythema and induration of near the umbilicus with subcentimeter central scab. No fluctuance. No drainage. Right groin area with 2 x 2 centimeter area of induration and erythema with central purulent head. Raynaud's below.  Psychiatric: normal mood and affect. behavior is normal. Judgment and thought content normal.   ED Course  INCISION AND DRAINAGE Date/Time: 02/16/2015 8:43 AM Performed by: Marily Memos, Nariyah Osias J Authorized by: Marily Memos, Laure Leone J Consent: Verbal consent obtained. Risks and benefits: risks, benefits and alternatives were discussed Consent given by: patient Patient identity confirmed: verbally with patient Type: abscess Location: Right groin area. Anesthesia method: Ethel chloride. Scalpel size: 11 Incision type: single straight Complexity: simple Drainage: purulent Drainage amount: scant Wound treatment: wound left open Packing material: none Patient tolerance: Patient tolerated the procedure well with no immediate complications   (including critical care time) Labs Review Labs Reviewed - No data to display  Imaging Review No results found.   MDM   1. Abscess and cellulitis    2 areas of cellulitis. Single area of abscess drained as below. Start clindamycin. Diflucan if difficulties to infection. NSAIDs for additional pain relief. Just changing to an antibacterial soap as this  happens on a somewhat regular basis for this patient.    Waldemar Dickens, MD 02/16/15 [phone]

## 2015-02-16 NOTE — ED Notes (Signed)
Pt has an abscess on her abdomen and her vagina.  Skin around the one on her abdomen is red.  Pt states the pain with her abdomen is 8/10

## 2015-02-16 NOTE — Discharge Instructions (Signed)
You developed a skin infection. One of these skin infections had pus in it and this was drained. We started antibiotics. Please use warm compresses and ibuprofen for additional relief. Use the Diflucan if he developed a yeast infection

## 2015-03-09 ENCOUNTER — Other Ambulatory Visit: Payer: Self-pay | Admitting: Nurse Practitioner

## 2015-03-09 DIAGNOSIS — Z1231 Encounter for screening mammogram for malignant neoplasm of breast: Secondary | ICD-10-CM

## 2015-03-16 ENCOUNTER — Ambulatory Visit
Admission: RE | Admit: 2015-03-16 | Discharge: 2015-03-16 | Disposition: A | Discharge status: Home / Self Care | Payer: Medicare Other | Source: Ambulatory Visit | Attending: Nurse Practitioner | Admitting: Nurse Practitioner

## 2015-03-16 DIAGNOSIS — Z1231 Encounter for screening mammogram for malignant neoplasm of breast: Secondary | ICD-10-CM

## 2015-08-09 ENCOUNTER — Emergency Department (HOSPITAL_COMMUNITY): Payer: Medicare Other

## 2015-08-09 ENCOUNTER — Emergency Department (HOSPITAL_COMMUNITY)
Admission: EM | Admit: 2015-08-09 | Discharge: 2015-08-09 | Disposition: A | Discharge status: Home / Self Care | Payer: Medicare Other | Attending: Emergency Medicine | Admitting: Emergency Medicine

## 2015-08-09 ENCOUNTER — Encounter (HOSPITAL_COMMUNITY): Payer: Self-pay | Admitting: Emergency Medicine

## 2015-08-09 DIAGNOSIS — E119 Type 2 diabetes mellitus without complications: Secondary | ICD-10-CM | POA: Diagnosis not present

## 2015-08-09 DIAGNOSIS — Z8673 Personal history of transient ischemic attack (TIA), and cerebral infarction without residual deficits: Secondary | ICD-10-CM | POA: Diagnosis not present

## 2015-08-09 DIAGNOSIS — Z87891 Personal history of nicotine dependence: Secondary | ICD-10-CM | POA: Insufficient documentation

## 2015-08-09 DIAGNOSIS — Z7901 Long term (current) use of anticoagulants: Secondary | ICD-10-CM | POA: Diagnosis not present

## 2015-08-09 DIAGNOSIS — Z7951 Long term (current) use of inhaled steroids: Secondary | ICD-10-CM | POA: Diagnosis not present

## 2015-08-09 DIAGNOSIS — Z792 Long term (current) use of antibiotics: Secondary | ICD-10-CM | POA: Insufficient documentation

## 2015-08-09 DIAGNOSIS — R1032 Left lower quadrant pain: Secondary | ICD-10-CM | POA: Diagnosis present

## 2015-08-09 DIAGNOSIS — Z79899 Other long term (current) drug therapy: Secondary | ICD-10-CM | POA: Diagnosis not present

## 2015-08-09 DIAGNOSIS — Z86018 Personal history of other benign neoplasm: Secondary | ICD-10-CM | POA: Diagnosis not present

## 2015-08-09 DIAGNOSIS — M109 Gout, unspecified: Secondary | ICD-10-CM | POA: Insufficient documentation

## 2015-08-09 DIAGNOSIS — Z8719 Personal history of other diseases of the digestive system: Secondary | ICD-10-CM | POA: Insufficient documentation

## 2015-08-09 DIAGNOSIS — N200 Calculus of kidney: Secondary | ICD-10-CM | POA: Diagnosis not present

## 2015-08-09 DIAGNOSIS — J45909 Unspecified asthma, uncomplicated: Secondary | ICD-10-CM | POA: Diagnosis not present

## 2015-08-09 DIAGNOSIS — Z7902 Long term (current) use of antithrombotics/antiplatelets: Secondary | ICD-10-CM | POA: Insufficient documentation

## 2015-08-09 DIAGNOSIS — I10 Essential (primary) hypertension: Secondary | ICD-10-CM | POA: Diagnosis not present

## 2015-08-09 LAB — URINALYSIS, ROUTINE W REFLEX MICROSCOPIC
BILIRUBIN URINE: NEGATIVE
GLUCOSE, UA: 500 mg/dL — AB
Ketones, ur: NEGATIVE mg/dL
Leukocytes, UA: NEGATIVE
Nitrite: NEGATIVE
PROTEIN: 100 mg/dL — AB
Specific Gravity, Urine: 1.015 (ref 1.005–1.030)
pH: 6.5 (ref 5.0–8.0)

## 2015-08-09 LAB — COMPREHENSIVE METABOLIC PANEL
ALBUMIN: 3.7 g/dL (ref 3.5–5.0)
ALT: 30 U/L (ref 14–54)
AST: 30 U/L (ref 15–41)
Alkaline Phosphatase: 61 U/L (ref 38–126)
Anion gap: 13 (ref 5–15)
BUN: 10 mg/dL (ref 6–20)
CO2: 24 mmol/L (ref 22–32)
Calcium: 9.2 mg/dL (ref 8.9–10.3)
Chloride: 99 mmol/L — ABNORMAL LOW (ref 101–111)
Creatinine, Ser: 1.05 mg/dL — ABNORMAL HIGH (ref 0.44–1.00)
GFR calc non Af Amer: 56 mL/min — ABNORMAL LOW (ref >60)
GLUCOSE: 240 mg/dL — AB (ref 65–99)
Potassium: 4.4 mmol/L (ref 3.5–5.1)
Sodium: 136 mmol/L (ref 135–145)
Total Bilirubin: 0.4 mg/dL (ref 0.3–1.2)
Total Protein: 7.5 g/dL (ref 6.5–8.1)

## 2015-08-09 LAB — URINE MICROSCOPIC-ADD ON
BACTERIA UA: NONE SEEN
WBC, UA: NONE SEEN WBC/hpf (ref 0–5)

## 2015-08-09 LAB — CBC WITH DIFFERENTIAL/PLATELET
BASOS ABS: 0 10*3/uL (ref 0.0–0.1)
Basophils Relative: 0 %
EOS ABS: 0 10*3/uL (ref 0.0–0.7)
EOS PCT: 0 %
HCT: 45.3 % (ref 36.0–46.0)
Hemoglobin: 15.5 g/dL — ABNORMAL HIGH (ref 12.0–15.0)
Lymphocytes Relative: 11 %
Lymphs Abs: 1.7 10*3/uL (ref 0.7–4.0)
MCH: 29 pg (ref 26.0–34.0)
MCHC: 34.2 g/dL (ref 30.0–36.0)
MCV: 84.7 fL (ref 78.0–100.0)
MONO ABS: 1 10*3/uL (ref 0.1–1.0)
Monocytes Relative: 7 %
Neutro Abs: 12.8 10*3/uL — ABNORMAL HIGH (ref 1.7–7.7)
Neutrophils Relative %: 82 %
PLATELETS: 296 10*3/uL (ref 150–400)
RBC: 5.35 MIL/uL — AB (ref 3.87–5.11)
RDW: 12.9 % (ref 11.5–15.5)
WBC: 15.6 10*3/uL — AB (ref 4.0–10.5)

## 2015-08-09 MED ORDER — ONDANSETRON HCL 4 MG/2ML IJ SOLN
4.0000 mg | Freq: Once | INTRAMUSCULAR | Status: AC
  Administered 2015-08-09: 4 mg via INTRAVENOUS
  Filled 2015-08-09: qty 2

## 2015-08-09 MED ORDER — IBUPROFEN 200 MG PO TABS
200.0000 mg | ORAL_TABLET | Freq: Four times a day (QID) | ORAL | Status: DC | PRN
Start: 2015-08-09 — End: 2017-01-18

## 2015-08-09 MED ORDER — MORPHINE SULFATE (PF) 4 MG/ML IV SOLN
4.0000 mg | Freq: Once | INTRAVENOUS | Status: AC
  Administered 2015-08-09: 4 mg via INTRAVENOUS
  Filled 2015-08-09: qty 1

## 2015-08-09 MED ORDER — OXYCODONE-ACETAMINOPHEN 5-325 MG PO TABS
ORAL_TABLET | ORAL | Status: AC
  Filled 2015-08-09: qty 1

## 2015-08-09 MED ORDER — SODIUM CHLORIDE 0.9 % IV BOLUS (SEPSIS)
1000.0000 mL | Freq: Once | INTRAVENOUS | Status: AC
  Administered 2015-08-09: 1000 mL via INTRAVENOUS

## 2015-08-09 MED ORDER — KETOROLAC TROMETHAMINE 30 MG/ML IJ SOLN
15.0000 mg | Freq: Once | INTRAMUSCULAR | Status: AC
  Administered 2015-08-09: 15 mg via INTRAVENOUS
  Filled 2015-08-09: qty 1

## 2015-08-09 MED ORDER — OXYCODONE-ACETAMINOPHEN 5-325 MG PO TABS: 1.0000 | ORAL_TABLET | ORAL | Status: DC | PRN

## 2015-08-09 MED ORDER — PROMETHAZINE HCL 25 MG PO TABS: 25.0000 mg | ORAL_TABLET | Freq: Four times a day (QID) | ORAL | Status: DC | PRN

## 2015-08-09 MED ORDER — OXYCODONE-ACETAMINOPHEN 5-325 MG PO TABS
1.0000 | ORAL_TABLET | Freq: Once | ORAL | Status: AC
  Administered 2015-08-09: 1 via ORAL

## 2015-08-09 MED ORDER — ONDANSETRON HCL 4 MG PO TABS: 4.0000 mg | ORAL_TABLET | Freq: Three times a day (TID) | ORAL | Status: DC | PRN

## 2015-08-09 NOTE — ED Provider Notes (Signed)
CSN: [id number]     Arrival date & time 08/09/15  1532 History   First MD Initiated Contact with Patient 08/09/15 [phone]     Chief Complaint  Patient presents with  . Flank Pain  . Abdominal Pain     (Consider location/radiation/quality/duration/timing/severity/associated sxs/prior Treatment) Patient is a 64 y.o. female presenting with abdominal pain. The history is provided by the patient.  Abdominal Pain Pain location:  L flank and LLQ Pain quality: sharp   Pain radiates to:  LLQ Pain severity:  Severe Onset quality:  Sudden Duration:  1 day Timing:  Intermittent Progression:  Waxing and waning Chronicity:  New Context: not previous surgeries, not recent illness, not sick contacts, not suspicious food intake and not trauma   Relieved by:  None tried Worsened by:  Nothing tried Ineffective treatments:  None tried Associated symptoms: nausea   Associated symptoms: no chest pain, no chills, no constipation, no diarrhea, no dysuria, no fever, no hematuria and no shortness of breath   Risk factors: has not had multiple surgeries     Past Medical History  Diagnosis Date  . Hypertension   . Asthma   . Diabetes mellitus   . Gout   . Stroke [facility])     pt has right sided weakness at times, gets ESI when weakness returns  . Uterine fibroid   . Umbilical hernia    Past Surgical History  Procedure Laterality Date  . Angioplasty      x 2 times  . Hand surgery    . Toe surgery     Family History  Problem Relation Age of Onset  . Cancer Mother     breast/ had mastecomy  . Cancer Father     colon   Social History  Substance Use Topics  . Smoking status: Former Smoker -- 1.50 packs/day for 34 years    Quit date: 09/24/1997  . Smokeless tobacco: Never Used  . Alcohol Use: No   OB History    Gravida Para Term Preterm AB TAB SAB Ectopic Multiple Living   3 3 3       3      Review of Systems  Constitutional: Negative for fever and chills.  HENT: Negative for facial  swelling.   Respiratory: Negative for shortness of breath.   Cardiovascular: Negative for chest pain.  Gastrointestinal: Positive for nausea and abdominal pain. Negative for diarrhea, constipation and rectal pain.  Genitourinary: Positive for flank pain. Negative for dysuria and hematuria.  Musculoskeletal: Negative for back pain.  Skin: Negative for rash.  Neurological: Negative for headaches.  Psychiatric/Behavioral: Negative for confusion.      Allergies  Review of patient's allergies indicates no known allergies.  Home Medications   Prior to Admission medications   Medication Sig Start Date End Date Taking? Authorizing Provider  acetaminophen (TYLENOL) 325 MG tablet Take 325-650 mg by mouth every 6 (six) hours as needed for mild pain, moderate pain, fever or headache.    Historical Provider, MD  amLODipine (NORVASC) 10 MG tablet Take 10 mg by mouth daily.      Historical Provider, MD  atorvastatin (LIPITOR) 10 MG tablet Take 10 mg by mouth daily at 6 PM.  01/25/15   Historical Provider, MD  clindamycin (CLEOCIN) 300 MG capsule Take 1 capsule (300 mg total) by mouth 3 (three) times daily. 02/16/15   Waldemar Dickens, MD  clopidogrel (PLAVIX) 75 MG tablet Take 75 mg by mouth daily.  05/01/12   Historical Provider, MD  colchicine 0.6 MG tablet Take 0.6 mg by mouth daily.      Historical Provider, MD  cyclobenzaprine (FLEXERIL) 5 MG tablet Take 5 mg by mouth 3 (three) times daily as needed. For muscle pain    Historical Provider, MD  fluconazole (DIFLUCAN) 150 MG tablet Take 1 tablet (150 mg total) by mouth daily. Repeat dose in 3 days 02/16/15   Waldemar Dickens, MD  Fluticasone-Salmeterol (ADVAIR) 250-50 MCG/DOSE AEPB Inhale 1 puff into the lungs every 12 (twelve) hours.    Historical Provider, MD  furosemide (LASIX) 20 MG tablet Take 20 mg by mouth daily.     Historical Provider, MD  gabapentin (NEURONTIN) 300 MG capsule Take 300 mg by mouth 2 (two) times daily.    Historical Provider, MD   LEVEMIR FLEXTOUCH 100 UNIT/ML Pen  12/27/14   Historical Provider, MD  lisinopril (PRINIVIL,ZESTRIL) 40 MG tablet Take 40 mg by mouth daily.  01/05/15   Historical Provider, MD  metroNIDAZOLE (FLAGYL) 500 MG tablet Take 1 tablet (500 mg total) by mouth 2 (two) times daily. 02/01/15   Truett Mainland, DO  oxyCODONE-acetaminophen (PERCOCET/ROXICET) 5-325 MG per tablet Take 1 tablet by mouth Every 6 hours as needed. For pain 04/25/12   Historical Provider, MD  PROAIR RESPICLICK [id number] (90 BASE) MCG/ACT AEPB  10/25/14   Historical Provider, MD  sitaGLIPtin-metformin (JANUMET) 50-1000 MG per tablet Take 1 tablet by mouth 2 (two) times daily with a meal.    Historical Provider, MD  warfarin (COUMADIN) 7.5 MG tablet Take 7.5 mg by mouth daily.    Historical Provider, MD   BP 184/97 mmHg  Pulse 110  Temp(Src) 98.7 F (37.1 C) (Oral)  Resp 18  Ht 5\' 5"  (1.651 m)  Wt 260 lb (117.935 kg)  BMI 43.27 kg/m2  SpO2 94% Physical Exam  Constitutional: She is oriented to person, place, and time. She appears well-developed and well-nourished. No distress.  HENT:  Head: Normocephalic and atraumatic.  Right Ear: External ear normal.  Left Ear: External ear normal.  Nose: Nose normal.  Mouth/Throat: Oropharynx is clear and moist. No oropharyngeal exudate.  Eyes: Conjunctivae and EOM are normal. Pupils are equal, round, and reactive to light. Right eye exhibits no discharge. Left eye exhibits no discharge. No scleral icterus.  Neck: Normal range of motion. Neck supple. No JVD present. No tracheal deviation present. No thyromegaly present.  Cardiovascular: Normal rate, regular rhythm and intact distal pulses.   Pulmonary/Chest: Effort normal and breath sounds normal. No stridor. No respiratory distress. She has no wheezes. She has no rales. She exhibits no tenderness.  Abdominal: Soft. She exhibits no distension. There is tenderness in the left lower quadrant. There is no CVA tenderness.  Musculoskeletal: Normal range  of motion. She exhibits no edema or tenderness.  Lymphadenopathy:    She has no cervical adenopathy.  Neurological: She is alert and oriented to person, place, and time.  Skin: Skin is warm and dry. No rash noted. She is not diaphoretic. No erythema. No pallor.  Psychiatric: She has a normal mood and affect. Her behavior is normal. Judgment and thought content normal.  Nursing note and vitals reviewed.   ED Course  Procedures (including critical care time) Labs Review Labs Reviewed  COMPREHENSIVE METABOLIC PANEL - Abnormal; Notable for the following:    Chloride 99 (*)    Glucose, Bld 240 (*)    Creatinine, Ser 1.05 (*)    GFR calc non Af Amer 56 (*)    All  other components within normal limits  URINALYSIS, ROUTINE W REFLEX MICROSCOPIC (NOT AT [facility]) - Abnormal; Notable for the following:    APPearance CLOUDY (*)    Glucose, UA 500 (*)    Hgb urine dipstick LARGE (*)    Protein, ur 100 (*)    All other components within normal limits  CBC WITH DIFFERENTIAL/PLATELET - Abnormal; Notable for the following:    WBC 15.6 (*)    RBC 5.35 (*)    Hemoglobin 15.5 (*)    Neutro Abs 12.8 (*)    All other components within normal limits  URINE MICROSCOPIC-ADD ON - Abnormal; Notable for the following:    Squamous Epithelial / LPF 0-5 (*)    All other components within normal limits    Imaging Review Ct Abdomen Pelvis W Contrast  08/09/2015  CLINICAL DATA:  Left lower quadrant pain and vomiting. EXAM: CT ABDOMEN AND PELVIS WITH CONTRAST TECHNIQUE: Multidetector CT imaging of the abdomen and pelvis was performed using the standard protocol following bolus administration of intravenous contrast. CONTRAST:  100 mL Omnipaque 300 IV. COMPARISON:  04/05/2010 FINDINGS: Lung bases demonstrate minimal scarring/ linear atelectasis. Several old right posterior rib fractures. Abdominal images demonstrate diffuse low attenuation of the liver without focal mass. The spleen, pancreas, gallbladder and right  adrenal gland are within normal. 1.6 cm left adrenal nodule unchanged likely an adenoma. Right kidney is within normal. Left kidney demonstrates delayed excretion with mild to moderate left-sided hydronephrosis. There is left perinephric inflammation/fluid. There is a 1.1 cm stone over the left renal pelvis. There is also a 3 mm stone over the proximal right ureter. Right ureter is normal. Appendix is normal. There is mild diverticulosis of the colon. Small bowel is within normal. Mesentery is normal. Small umbilical hernia containing only mesenteric fat unchanged. There is mild-to-moderate calcified plaque over the abdominal aorta and iliac arteries. Pelvic images demonstrate the bladder and rectum to be within normal. Small fundal uterine fibroid unchanged ovaries are within normal. Mild degenerate change of the spine and hips. IMPRESSION: 3 mm stone over the proximal left ureter as well as a 1.1 cm stone within the left renal pelvis as there is moderate associated obstruction. Diverticulosis of the colon. Stable 1.6 cm left adrenal nodule likely an adenoma. Tiny fat containing umbilical hernia unchanged. Small uterine fundal fibroid unchanged. Electronically Signed   By: Marin Olp M.D.   On: 08/09/2015 19:14   I have personally reviewed and evaluated these images and lab results as part of my medical decision-making.   MDM   Final diagnoses:  LLQ pain  Renal calculus, left   Katoria B Huwe is a 64 y.o. female patient presenting with LLQ pain.  Symptoms c/w renal calculus.  Hx of diverticula on prior imaging, but symptoms less suggestive of diverticulitis.  Other serious intraabdominal pathology appears less likely.  UA negative for infection but large blood.   CT obtained and shows L renal calculus.    Pain improved with ED medications.  Pt has an 4mm stone.  Spoke with [facility] o/c MD for request for close pt follow up.  Pt given office information to make a follow up  information.  Patient was given return precautions for renal calculi.  Pt advised on use of medications as applicable.  Advised to return for actely worsening symptoms, inability to take medications, or other acute concerns.  Advised to follow up with urology in 1 week.  Patient was in agreement with and expressed understanding of  follow plan, plan of care, and return precautions.  All questions answered prior to discharge.  Patient was discharged in stable condition, ambulating without difficulty.  Patient care was discussed with my attending, Dr. Tyrone Nine.       Hoyle Sauer, MD 08/11/15 [place], DO 08/11/15 [place], DO 08/11/15 [phone]

## 2015-08-09 NOTE — ED Notes (Signed)
MD at bedside. 

## 2015-08-09 NOTE — ED Notes (Signed)
Pt reports new onset abdominal pain to LLQ to left flank pain starting this morning. Denies urinary symptoms. Pt reports she has a hx of fibroids.

## 2015-08-09 NOTE — ED Notes (Signed)
MD at bedside. Tyrone Nine

## 2015-08-12 MED ORDER — IOHEXOL 300 MG/ML  SOLN
100.0000 mL | Freq: Once | INTRAMUSCULAR | Status: AC | PRN
  Administered 2015-08-09: 100 mL via INTRAVENOUS

## 2015-08-24 ENCOUNTER — Other Ambulatory Visit: Payer: Self-pay | Admitting: Urology

## 2015-08-30 ENCOUNTER — Encounter (HOSPITAL_COMMUNITY): Payer: Self-pay

## 2015-08-30 ENCOUNTER — Encounter (HOSPITAL_COMMUNITY)
Admission: RE | Admit: 2015-08-30 | Discharge: 2015-08-30 | Disposition: A | Discharge status: Home / Self Care | Payer: Medicare Other | Source: Ambulatory Visit | Attending: Urology | Admitting: Urology

## 2015-08-30 DIAGNOSIS — K219 Gastro-esophageal reflux disease without esophagitis: Secondary | ICD-10-CM | POA: Diagnosis not present

## 2015-08-30 DIAGNOSIS — Z6841 Body Mass Index (BMI) 40.0 and over, adult: Secondary | ICD-10-CM | POA: Diagnosis not present

## 2015-08-30 DIAGNOSIS — Z8673 Personal history of transient ischemic attack (TIA), and cerebral infarction without residual deficits: Secondary | ICD-10-CM | POA: Diagnosis not present

## 2015-08-30 DIAGNOSIS — N202 Calculus of kidney with calculus of ureter: Secondary | ICD-10-CM | POA: Diagnosis present

## 2015-08-30 DIAGNOSIS — Z7902 Long term (current) use of antithrombotics/antiplatelets: Secondary | ICD-10-CM | POA: Diagnosis not present

## 2015-08-30 DIAGNOSIS — I4510 Unspecified right bundle-branch block: Secondary | ICD-10-CM | POA: Diagnosis not present

## 2015-08-30 DIAGNOSIS — J45909 Unspecified asthma, uncomplicated: Secondary | ICD-10-CM | POA: Diagnosis not present

## 2015-08-30 DIAGNOSIS — Z7901 Long term (current) use of anticoagulants: Secondary | ICD-10-CM | POA: Diagnosis not present

## 2015-08-30 DIAGNOSIS — Z87891 Personal history of nicotine dependence: Secondary | ICD-10-CM | POA: Diagnosis not present

## 2015-08-30 DIAGNOSIS — E1165 Type 2 diabetes mellitus with hyperglycemia: Secondary | ICD-10-CM | POA: Diagnosis not present

## 2015-08-30 DIAGNOSIS — Z79899 Other long term (current) drug therapy: Secondary | ICD-10-CM | POA: Diagnosis not present

## 2015-08-30 DIAGNOSIS — Z794 Long term (current) use of insulin: Secondary | ICD-10-CM | POA: Diagnosis not present

## 2015-08-30 DIAGNOSIS — I10 Essential (primary) hypertension: Secondary | ICD-10-CM | POA: Diagnosis not present

## 2015-08-30 DIAGNOSIS — N200 Calculus of kidney: Secondary | ICD-10-CM | POA: Diagnosis not present

## 2015-08-30 DIAGNOSIS — E7800 Pure hypercholesterolemia, unspecified: Secondary | ICD-10-CM | POA: Diagnosis not present

## 2015-08-30 DIAGNOSIS — M109 Gout, unspecified: Secondary | ICD-10-CM | POA: Diagnosis not present

## 2015-08-30 HISTORY — DX: Myoneural disorder, unspecified: G70.9

## 2015-08-30 LAB — BASIC METABOLIC PANEL
ANION GAP: 9 (ref 5–15)
BUN: 17 mg/dL (ref 6–20)
CALCIUM: 9.1 mg/dL (ref 8.9–10.3)
CHLORIDE: 106 mmol/L (ref 101–111)
CO2: 24 mmol/L (ref 22–32)
Creatinine, Ser: 0.84 mg/dL (ref 0.44–1.00)
Glucose, Bld: 325 mg/dL — ABNORMAL HIGH (ref 65–99)
POTASSIUM: 4.7 mmol/L (ref 3.5–5.1)
SODIUM: 139 mmol/L (ref 135–145)

## 2015-08-30 LAB — CBC
HEMATOCRIT: 40.3 % (ref 36.0–46.0)
HEMOGLOBIN: 13.5 g/dL (ref 12.0–15.0)
MCH: 28.7 pg (ref 26.0–34.0)
MCHC: 33.5 g/dL (ref 30.0–36.0)
MCV: 85.6 fL (ref 78.0–100.0)
Platelets: 283 10*3/uL (ref 150–400)
RBC: 4.71 MIL/uL (ref 3.87–5.11)
RDW: 12.7 % (ref 11.5–15.5)
WBC: 8.1 10*3/uL (ref 4.0–10.5)

## 2015-08-30 NOTE — Pre-Procedure Instructions (Signed)
08-30-15 EKG done today.

## 2015-08-30 NOTE — Patient Instructions (Addendum)
[place]  08/30/2015   Your procedure is scheduled on:   09-01-2015  Enter through [facility]  Entrance and follow signs to [facility] to [place]. Arrive at    1100    AM ..  (Limit 1 person with you).  Call this number if you have problems the morning of surgery: [phone]  Or Presurgical Testing [phone] days before.   For Living Will and/or Health Care Power Attorney Forms: please provide copy for your medical record,may bring AM of surgery(Forms should be already notarized -we do not provide this service).(No information preferred today).   Do not eat food/ or drink: After Midnight.  Exception: may have Clear Liquids; black coffee, tea, soft drinks, water, clear broth apple juice, cranberry juice) until 0700 AM, then nothing.   Take these medicines the morning of surgery with A SIP OF WATER-   (DO NOT TAKE ANY DIABETIC MEDS AM OF SURGERY) : Amlodipine. Gabapentin. Oxycodone. Use/bring Inhalers, Levemir (1/2 usual PM dose) night before .Coumadin,Plavix use per MD instructions.   Do not wear jewelry, make-up or nail polish.  Do not wear deodorant, lotions, powders, or perfumes.   Do not shave legs and under arms- 48 hours(2 days) prior to first CHG shower.(Shaving face and neck okay.)  Do not bring valuables to the hospital.(Hospital is not responsible for lost valuables).  Contacts, dentures or removable bridgework, body piercing, hair pins may not be worn into surgery.  Leave suitcase in the car. After surgery it may be brought to your room.  For patients admitted to the hospital, checkout time is 11:00 AM the day of discharge.(Restricted visitors-Any Persons displaying flu-like symptoms or illness).    Patients discharged the day of surgery will not be allowed to drive home. Must have responsible person with you x 24 hours once discharged.  Name and phone number of your driver: Lenise Arena [phone] cell     Please read  over the following fact sheets that you were given:  CHG(Chlorhexidine Gluconate 4% Surgical Soap) use.[facility] - Preparing for Surgery Before surgery, you can play an important role.  Because skin is not sterile, your skin needs to be as free of germs as possible.  You can reduce the number of germs on your skin by washing with CHG (chlorahexidine gluconate) soap before surgery.  CHG is an antiseptic cleaner which kills germs and bonds with the skin to continue killing germs even after washing. Please DO NOT use if you have an allergy to CHG or antibacterial soaps.  If your skin becomes reddened/irritated stop using the CHG and inform your nurse when you arrive at Short Stay. Do not shave (including legs and underarms) for at least 48 hours prior to the first CHG shower.  You may shave your face/neck. Please follow these instructions carefully:  1.  Shower with CHG Soap the night before surgery and the  morning of Surgery.  2.  If you choose to wash your hair, wash your hair first as usual with your  normal  shampoo.  3.  After you shampoo, rinse your hair and body thoroughly to remove the  shampoo.                           4.  Use CHG as you would any other liquid soap.  You can apply chg directly  to  the skin and wash                       Gently with a scrungie or clean washcloth.  5.  Apply the CHG Soap to your body ONLY FROM THE NECK DOWN.   Do not use on face/ open                           Wound or open sores. Avoid contact with eyes, ears mouth and genitals (private parts).                       Wash face,  Genitals (private parts) with your normal soap.             6.  Wash thoroughly, paying special attention to the area where your surgery  will be performed.  7.  Thoroughly rinse your body with warm water from the neck down.  8.  DO NOT shower/wash with your normal soap after using and rinsing off  the CHG Soap.                9.  Pat yourself dry with a clean  towel.            10.  Wear clean pajamas.            11.  Place clean sheets on your bed the night of your first shower and do not  sleep with pets. Day of Surgery : Do not apply any lotions/deodorants the morning of surgery.  Please wear clean clothes to the hospital/surgery center.  FAILURE TO FOLLOW THESE INSTRUCTIONS MAY RESULT IN THE CANCELLATION OF YOUR SURGERY PATIENT SIGNATURE_________________________________  NURSE SIGNATURE__________________________________  ________________________________________________________________________

## 2015-08-30 NOTE — Progress Notes (Signed)
08-30-15 1550 Labs viewable in Epic-note Glucose, not fasting, Hgb A1C pending done today.

## 2015-08-31 LAB — HEMOGLOBIN A1C
Hgb A1c MFr Bld: 12.2 % — ABNORMAL HIGH (ref 4.8–5.6)
Mean Plasma Glucose: 303 mg/dL

## 2015-08-31 NOTE — Anesthesia Preprocedure Evaluation (Addendum)
Anesthesia Evaluation  Patient identified by MRN, date of birth, ID band Patient awake    Reviewed: Allergy & Precautions, NPO status , Patient's Chart, lab work & pertinent test results  Airway Mallampati: II   Neck ROM: Full    Dental  (+) Missing, Poor Dentition, Dental Advisory Given   Pulmonary neg pulmonary ROS, asthma , former smoker (Quit 2000 55 pack year),    breath sounds clear to auscultation       Cardiovascular hypertension, Pt. on medications negative cardio ROS   Rhythm:Regular  EKG RBB   Neuro/Psych CVA negative psych ROS   GI/Hepatic negative GI ROS, Neg liver ROS, GERD  ,  Endo/Other  negative endocrine ROSdiabetes, Poorly Controlled, Type 2Morbid obesity  Renal/GU negative Renal ROS  negative genitourinary   Musculoskeletal negative musculoskeletal ROS (+)   Abdominal (+) + obese,   Peds negative pediatric ROS (+)  Hematology negative hematology ROS (+)   Anesthesia Other Findings   Reproductive/Obstetrics negative OB ROS                           Anesthesia Physical Anesthesia Plan  ASA: III  Anesthesia Plan: General   Post-op Pain Management:    Induction: Intravenous  Airway Management Planned: Oral ETT  Additional Equipment:   Intra-op Plan:   Post-operative Plan: Extubation in OR  Informed Consent: I have reviewed the patients History and Physical, chart, labs and discussed the procedure including the risks, benefits and alternatives for the proposed anesthesia with the patient or authorized representative who has indicated his/her understanding and acceptance.     Plan Discussed with:   Anesthesia Plan Comments:         Anesthesia Quick Evaluation

## 2015-08-31 NOTE — Progress Notes (Signed)
Labs viewable in Epic-please note, pt is known Diabetic.

## 2015-08-31 NOTE — H&P (Signed)
Active Problems Problems  1. Calculus of left ureter (N20.1) 2. Kidney stone on left side (N20.0)  History of Present Illness Teresa Kerr is a 64 yo female who is sent from the ER for a 94mm obstructing left proximal stone and a 1cm left renal pelvic stone. She had the onset about 10 days ago of left flank pain that was severe with nausea and vomiting but no hematuria. She had no fever or voiding complaints. She still has some pain but it is reduced. She has had no prior history of stones or UTI's. She has had no GU surgery. She is on both plavix and warfarin. Her UA in the ER just had blood but she is on cephalexin.   Past Medical History Problems  1. History of arthritis (Z87.39) 2. History of asthma (Z87.09) 3. History of diabetes mellitus (Z86.39) 4. History of esophageal reflux (Z87.19) 5. History of gout (Z87.39) 6. History of hypercholesterolemia (Z86.39) 7. History of hypertension (Z86.79) 8. History of stroke [id number])  Surgical History Problems  1. 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 Acetaminophen TABS;  Therapy: (Recorded:21Nov2016) to Recorded 2. AmLODIPine Besylate 10 MG Oral Tablet;  Therapy: (Recorded:21Nov2016) to Recorded 3. Atorvastatin Calcium 10 MG Oral Tablet;  Therapy: (Recorded:21Nov2016) to Recorded 4. Cephalexin 500 MG Oral Capsule;  Therapy: (Recorded:21Nov2016) to Recorded 5. Colchicine 0.6 MG Oral Tablet;  Therapy: (Recorded:21Nov2016) to Recorded 6. Coumadin 7.5 MG Oral Tablet;  Therapy: (Recorded:21Nov2016) to Recorded 7. Cyclobenzaprine HCl - 5 MG Oral Tablet;  Therapy: (Recorded:21Nov2016) to Recorded 8. Fluticasone Propionate SUSP;  Therapy: (Recorded:21Nov2016) to Recorded 9. Gabapentin 300 MG Oral Capsule;  Therapy: (Recorded:21Nov2016) to Recorded 10. Ibuprofen TABS;   Therapy: (Recorded:21Nov2016) to Recorded 11. Levemir FlexPen 100 UNIT/ML SOLN;   Therapy: (Recorded:21Nov2016) to  Recorded 12. Lisinopril 40 MG Oral Tablet;   Therapy: (Recorded:21Nov2016) to Recorded 13. Ondansetron 4 MG Oral Tablet Dispersible;   Therapy: (Recorded:21Nov2016) to Recorded 14. Oxycodone-Acetaminophen TABS;   Therapy: (Recorded:21Nov2016) to Recorded 15. Plavix 75 MG Oral Tablet;   Therapy: (Recorded:21Nov2016) to Recorded 16. Promethazine HCl - 25 MG Oral Tablet;   Therapy: (Recorded:21Nov2016) to Recorded  Allergies Medication  1. No Known Drug Allergies  Family History Problems  1. Family history of malignant neoplasm of prostate [id number]) : Father  Social History Problems    Denied: History of Alcohol use   Caffeine use (F15.90)   1-2 per day   Former smoker [phone])   smoked 1 1/2 ppd for 35 years, quit smoking in 2000.   Number of children   2 sons, 1 daughter  Review of Systems Genitourinary, constitutional, skin, eye, otolaryngeal, hematologic/lymphatic, cardiovascular, pulmonary, endocrine, musculoskeletal, gastrointestinal, neurological and psychiatric system(s) were reviewed and pertinent findings if present are noted and are otherwise negative.  Gastrointestinal: nausea, vomiting and heartburn.  Constitutional: feeling tired (fatigue).  Integumentary: skin rash/lesion and pruritus.  Eyes: blurred vision.  ENT: sinus problems.  Hematologic/Lymphatic: a tendency to easily bruise.  Cardiovascular: leg swelling.  Respiratory: shortness of breath and cough.  Musculoskeletal: back pain and joint pain.  Psychiatric: depression.    Vitals Vital Signs [Data Includes: Last 1 Day]  Recorded: 21Nov2016 09:29AM  Height: 5 ft 5 in Weight: 260 lb  BMI Calculated: 43.27 BSA Calculated: 2.21 Blood Pressure: 158 / 94 Temperature: 97 F Heart Rate: 79  Physical Exam Constitutional: Well nourished and well developed . No acute distress.  ENT:. The ears and nose are normal in appearance.  Neck: The appearance  of the neck is normal and no neck mass is present.   Pulmonary: No respiratory distress and normal respiratory rhythm and effort.  Cardiovascular: Heart rate and rhythm are normal . No peripheral edema.  Abdomen: The abdomen is obese. The abdomen is soft and nontender. No masses are palpated. No CVA tenderness. No hernias are palpable. No hepatosplenomegaly noted.  Lymphatics: The posterior cervical and supraclavicular nodes are not enlarged or tender.  Skin: Normal skin turgor, no visible rash and no visible skin lesions.  Neuro/Psych:. Mood and affect are appropriate.    Results/Data  Old records or history reviewed: ER notes reviewed.  The following images/tracing/specimen were independently visualized:  CT films and report reviewed. KUB today shows a possible faint 87mm stone in the area of the left renal pelvis. No ureteral stones are seen. There are no significant gas, soft tissue or bony abnormalities.  The following clinical lab reports were reviewed:  UA and labs from ER reviewed.    Assessment Assessed  1. Kidney stone on left side (N20.0) 2. Calculus of left ureter (N20.1)  She has a 13mm left proximal stone and a 1cm left renal pelvic stone. The stones are largely radiolucent and possibly uric acid.   Plan Health Maintenance  1. UA With REFLEX; [Do Not Release]; Status:In Progress - Specimen/Data Collected;    Done: [id number] Kidney stone on left side  2. KUB; Status:Resulted - Requires Verification;   [id number] 10:27AM  I don't think her stone is sufficiently visible for ESWL soam going to get her set up for ureteroscopic stone extraction with laser and stenting. I have reviewed the risks of bleeding, infection, ureteral injury, need for secondary procedures, need for a stent, thrombotic events and anesthetic risks.   She will need to be off of warfarin and if possible plavix as well.  I will have her cleared by her PCP.   Discussion/Summary CC: Dr. Loma Boston with the [facility].

## 2015-09-01 ENCOUNTER — Ambulatory Visit (HOSPITAL_COMMUNITY): Payer: Medicare Other | Admitting: Anesthesiology

## 2015-09-01 ENCOUNTER — Ambulatory Visit (HOSPITAL_COMMUNITY)
Admission: RE | Admit: 2015-09-01 | Discharge: 2015-09-01 | Disposition: A | Discharge status: Home / Self Care | Payer: Medicare Other | Source: Ambulatory Visit | Attending: Urology | Admitting: Urology

## 2015-09-01 ENCOUNTER — Encounter (HOSPITAL_COMMUNITY): Payer: Self-pay | Admitting: *Deleted

## 2015-09-01 ENCOUNTER — Encounter (HOSPITAL_COMMUNITY)
Admission: RE | Disposition: A | Discharge status: Home / Self Care | Payer: Self-pay | Source: Ambulatory Visit | Attending: Urology

## 2015-09-01 DIAGNOSIS — N200 Calculus of kidney: Secondary | ICD-10-CM | POA: Diagnosis not present

## 2015-09-01 DIAGNOSIS — E1165 Type 2 diabetes mellitus with hyperglycemia: Secondary | ICD-10-CM | POA: Insufficient documentation

## 2015-09-01 DIAGNOSIS — J45909 Unspecified asthma, uncomplicated: Secondary | ICD-10-CM | POA: Diagnosis not present

## 2015-09-01 DIAGNOSIS — Z6841 Body Mass Index (BMI) 40.0 and over, adult: Secondary | ICD-10-CM | POA: Insufficient documentation

## 2015-09-01 DIAGNOSIS — M109 Gout, unspecified: Secondary | ICD-10-CM | POA: Insufficient documentation

## 2015-09-01 DIAGNOSIS — Z8673 Personal history of transient ischemic attack (TIA), and cerebral infarction without residual deficits: Secondary | ICD-10-CM | POA: Insufficient documentation

## 2015-09-01 DIAGNOSIS — E7800 Pure hypercholesterolemia, unspecified: Secondary | ICD-10-CM | POA: Insufficient documentation

## 2015-09-01 DIAGNOSIS — Z7901 Long term (current) use of anticoagulants: Secondary | ICD-10-CM | POA: Insufficient documentation

## 2015-09-01 DIAGNOSIS — K219 Gastro-esophageal reflux disease without esophagitis: Secondary | ICD-10-CM | POA: Diagnosis not present

## 2015-09-01 DIAGNOSIS — I4510 Unspecified right bundle-branch block: Secondary | ICD-10-CM | POA: Insufficient documentation

## 2015-09-01 DIAGNOSIS — I10 Essential (primary) hypertension: Secondary | ICD-10-CM | POA: Insufficient documentation

## 2015-09-01 DIAGNOSIS — Z79899 Other long term (current) drug therapy: Secondary | ICD-10-CM | POA: Insufficient documentation

## 2015-09-01 DIAGNOSIS — Z7902 Long term (current) use of antithrombotics/antiplatelets: Secondary | ICD-10-CM | POA: Insufficient documentation

## 2015-09-01 DIAGNOSIS — Z794 Long term (current) use of insulin: Secondary | ICD-10-CM | POA: Insufficient documentation

## 2015-09-01 DIAGNOSIS — Z87891 Personal history of nicotine dependence: Secondary | ICD-10-CM | POA: Insufficient documentation

## 2015-09-01 HISTORY — PX: HOLMIUM LASER APPLICATION: SHX5852

## 2015-09-01 HISTORY — PX: CYSTOSCOPY WITH RETROGRADE PYELOGRAM, URETEROSCOPY AND STENT PLACEMENT: SHX5789

## 2015-09-01 LAB — PROTIME-INR
INR: 1.05 (ref 0.00–1.49)
Prothrombin Time: 13.9 seconds (ref 11.6–15.2)

## 2015-09-01 LAB — GLUCOSE, CAPILLARY
GLUCOSE-CAPILLARY: 200 mg/dL — AB (ref 65–99)
Glucose-Capillary: 296 mg/dL — ABNORMAL HIGH (ref 65–99)

## 2015-09-01 SURGERY — CYSTOURETEROSCOPY, WITH RETROGRADE PYELOGRAM AND STENT INSERTION
Anesthesia: General | Laterality: Left

## 2015-09-01 MED ORDER — FENTANYL CITRATE (PF) 100 MCG/2ML IJ SOLN
INTRAMUSCULAR | Status: AC
  Filled 2015-09-01: qty 2

## 2015-09-01 MED ORDER — IOHEXOL 300 MG/ML  SOLN
INTRAMUSCULAR | Status: DC | PRN
  Administered 2015-09-01: 15:00:00

## 2015-09-01 MED ORDER — INSULIN ASPART 100 UNIT/ML ~~LOC~~ SOLN
8.0000 [IU] | SUBCUTANEOUS | Status: AC
  Administered 2015-09-01: 8 [IU] via SUBCUTANEOUS
  Filled 2015-09-01: qty 1

## 2015-09-01 MED ORDER — SODIUM CHLORIDE 0.9 % IV SOLN: 250.0000 mL | INTRAVENOUS | Status: DC | PRN

## 2015-09-01 MED ORDER — PHENAZOPYRIDINE HCL 200 MG PO TABS: 200.0000 mg | ORAL_TABLET | Freq: Three times a day (TID) | ORAL | Status: DC | PRN

## 2015-09-01 MED ORDER — OXYCODONE-ACETAMINOPHEN 5-325 MG PO TABS
1.0000 | ORAL_TABLET | Freq: Once | ORAL | Status: AC
  Administered 2015-09-01: 1 via ORAL

## 2015-09-01 MED ORDER — LIDOCAINE HCL (CARDIAC) 20 MG/ML IV SOLN
INTRAVENOUS | Status: AC
  Filled 2015-09-01: qty 5

## 2015-09-01 MED ORDER — ROCURONIUM BROMIDE 100 MG/10ML IV SOLN
INTRAVENOUS | Status: AC
  Filled 2015-09-01: qty 1

## 2015-09-01 MED ORDER — ONDANSETRON HCL 4 MG/2ML IJ SOLN
INTRAMUSCULAR | Status: DC | PRN
  Administered 2015-09-01: 4 mg via INTRAVENOUS

## 2015-09-01 MED ORDER — SODIUM CHLORIDE 0.9 % IJ SOLN: 3.0000 mL | Freq: Two times a day (BID) | INTRAMUSCULAR | Status: DC

## 2015-09-01 MED ORDER — CIPROFLOXACIN IN D5W 400 MG/200ML IV SOLN
400.0000 mg | INTRAVENOUS | Status: AC
  Administered 2015-09-01: 400 mg via INTRAVENOUS

## 2015-09-01 MED ORDER — PROPOFOL 10 MG/ML IV BOLUS
INTRAVENOUS | Status: AC
  Filled 2015-09-01: qty 20

## 2015-09-01 MED ORDER — LACTATED RINGERS IV SOLN
INTRAVENOUS | Status: DC
  Administered 2015-09-01: 16:00:00 via INTRAVENOUS
  Administered 2015-09-01: 1000 mL via INTRAVENOUS

## 2015-09-01 MED ORDER — FENTANYL CITRATE (PF) 100 MCG/2ML IJ SOLN
INTRAMUSCULAR | Status: AC
Start: 2015-09-01 — End: 2015-09-01
  Filled 2015-09-01: qty 2

## 2015-09-01 MED ORDER — SODIUM CHLORIDE 0.9 % IJ SOLN: 3.0000 mL | INTRAMUSCULAR | Status: DC | PRN

## 2015-09-01 MED ORDER — FENTANYL CITRATE (PF) 100 MCG/2ML IJ SOLN
INTRAMUSCULAR | Status: DC | PRN
  Administered 2015-09-01 (×2): 50 ug via INTRAVENOUS
  Administered 2015-09-01: 100 ug via INTRAVENOUS
  Administered 2015-09-01 (×2): 50 ug via INTRAVENOUS

## 2015-09-01 MED ORDER — OXYCODONE-ACETAMINOPHEN 5-325 MG PO TABS: 1.0000 | ORAL_TABLET | ORAL | Status: DC | PRN

## 2015-09-01 MED ORDER — SODIUM CHLORIDE 0.9 % IR SOLN
Status: DC | PRN
  Administered 2015-09-01: 3000 mL

## 2015-09-01 MED ORDER — MEPERIDINE HCL 50 MG/ML IJ SOLN: 6.2500 mg | INTRAMUSCULAR | Status: DC | PRN

## 2015-09-01 MED ORDER — PROPOFOL 10 MG/ML IV BOLUS
INTRAVENOUS | Status: DC | PRN
  Administered 2015-09-01: 200 mg via INTRAVENOUS

## 2015-09-01 MED ORDER — SODIUM CHLORIDE 0.9 % IR SOLN
Status: DC | PRN
  Administered 2015-09-01: 2000 mL

## 2015-09-01 MED ORDER — PROMETHAZINE HCL 25 MG/ML IJ SOLN: 6.2500 mg | INTRAMUSCULAR | Status: DC | PRN

## 2015-09-01 MED ORDER — ACETAMINOPHEN 10 MG/ML IV SOLN
1000.0000 mg | Freq: Once | INTRAVENOUS | Status: AC
  Administered 2015-09-01: 1000 mg via INTRAVENOUS

## 2015-09-01 MED ORDER — OXYCODONE-ACETAMINOPHEN 5-325 MG PO TABS
ORAL_TABLET | ORAL | Status: AC
  Filled 2015-09-01: qty 1

## 2015-09-01 MED ORDER — ACETAMINOPHEN 650 MG RE SUPP
650.0000 mg | RECTAL | Status: DC | PRN
  Filled 2015-09-01: qty 1

## 2015-09-01 MED ORDER — ONDANSETRON HCL 4 MG/2ML IJ SOLN
INTRAMUSCULAR | Status: AC
  Filled 2015-09-01: qty 2

## 2015-09-01 MED ORDER — OXYCODONE HCL 5 MG PO TABS: 5.0000 mg | ORAL_TABLET | ORAL | Status: DC | PRN

## 2015-09-01 MED ORDER — ACETAMINOPHEN 325 MG PO TABS: 650.0000 mg | ORAL_TABLET | ORAL | Status: DC | PRN

## 2015-09-01 MED ORDER — FENTANYL CITRATE (PF) 100 MCG/2ML IJ SOLN: 25.0000 ug | INTRAMUSCULAR | Status: DC | PRN

## 2015-09-01 MED ORDER — FENTANYL CITRATE (PF) 100 MCG/2ML IJ SOLN
25.0000 ug | INTRAMUSCULAR | Status: DC | PRN
  Administered 2015-09-01 (×3): 50 ug via INTRAVENOUS

## 2015-09-01 MED ORDER — CIPROFLOXACIN IN D5W 400 MG/200ML IV SOLN
INTRAVENOUS | Status: AC
  Filled 2015-09-01: qty 200

## 2015-09-01 MED ORDER — ACETAMINOPHEN 10 MG/ML IV SOLN
INTRAVENOUS | Status: AC
  Filled 2015-09-01: qty 100

## 2015-09-01 MED ORDER — LIDOCAINE HCL (CARDIAC) 20 MG/ML IV SOLN
INTRAVENOUS | Status: DC | PRN
  Administered 2015-09-01: 100 mg via INTRAVENOUS

## 2015-09-01 SURGICAL SUPPLY — 18 items
BAG URO CATCHER STRL LF (DRAPE) ×3 IMPLANT
BASKET DAKOTA 1.9FR 11X120 (BASKET) ×2 IMPLANT
CATH URET 5FR 28IN OPEN ENDED (CATHETERS) IMPLANT
CLOTH BEACON ORANGE TIMEOUT ST (SAFETY) ×3 IMPLANT
FIBER LASER FLEXIVA 1000 (UROLOGICAL SUPPLIES) IMPLANT
FIBER LASER FLEXIVA 200 (UROLOGICAL SUPPLIES) ×2 IMPLANT
FIBER LASER FLEXIVA 365 (UROLOGICAL SUPPLIES) IMPLANT
FIBER LASER FLEXIVA 550 (UROLOGICAL SUPPLIES) IMPLANT
FIBER LASER TRAC TIP (UROLOGICAL SUPPLIES) ×2 IMPLANT
GLOVE SURG SS PI 8.0 STRL IVOR (GLOVE) IMPLANT
GOWN STRL REUS W/TWL XL LVL3 (GOWN DISPOSABLE) ×3 IMPLANT
GUIDEWIRE STR DUAL SENSOR (WIRE) IMPLANT
MANIFOLD NEPTUNE II (INSTRUMENTS) ×3 IMPLANT
PACK CYSTO (CUSTOM PROCEDURE TRAY) ×3 IMPLANT
SHEATH ACCESS URETERAL 38CM (SHEATH) ×3 IMPLANT
STENT CONTOUR 6FRX26X.038 (STENTS) ×2 IMPLANT
TUBING CONNECTING 10 (TUBING) ×2 IMPLANT
TUBING CONNECTING 10' (TUBING) ×1

## 2015-09-01 NOTE — Discharge Instructions (Signed)
CYSTOSCOPY HOME CARE INSTRUCTIONS  Activity: Rest for the remainder of the day.  Do not drive or operate equipment today.  You may resume normal activities in one to two days as instructed by your physician.   Meals: Drink plenty of liquids and eat light foods such as gelatin or soup this evening.  You may return to a normal meal plan tomorrow.  Return to Work: You may return to work in one to two days or as instructed by your physician.  Special Instructions / Symptoms: Call your physician if any of these symptoms occur:   -persistent or heavy bleeding  -bleeding which continues after first few urination  -large blood clots that are difficult to pass  -urine stream diminishes or stops completely  -fever equal to or higher than 101 degrees Farenheit.  -cloudy urine with a strong, foul odor  -severe pain  Females should always wipe from front to back after elimination.  You may feel some burning pain when you urinate.  This should disappear with time.  Applying moist heat to the lower abdomen or a hot tub bath may help relieve the pain. \  My office will call to arrange an additional f/u visit for next week to remove the stent that was placed in your kidney.  Please bring your stones to the office.   You may resume the warfarin tomorrow if you are not bleeding if there is bleeding, wait until it clears.   Patient Signature:  ________________________________________________________  Nurse's Signature:  ________________________________________________________

## 2015-09-01 NOTE — Interval H&P Note (Signed)
History and Physical Interval Note:  09/01/2015 2:23 PM  Teresa Kerr  has presented today for surgery, with the diagnosis of LEFT RENAL AND URETERAL STONES   The various methods of treatment have been discussed with the patient and family. After consideration of risks, benefits and other options for treatment, the patient has consented to  Procedure(s): CYSTOSCOPY WITH LEFT RETROGRADE, LEFT URETEROSCOPY AND STENT PLACEMENT (Left) WITH HOLMIUM LASER  (Left) as a surgical intervention .  The patient's history has been reviewed, patient examined, no change in status, stable for surgery.  I have reviewed the patient's chart and labs.  Questions were answered to the patient's satisfaction.     [name]

## 2015-09-01 NOTE — Brief Op Note (Signed)
09/01/2015  3:49 PM  PATIENT:  Teresa Kerr  64 y.o. female  PRE-OPERATIVE DIAGNOSIS:  LEFT RENAL AND URETERAL STONES   POST-OPERATIVE DIAGNOSIS:  LEFT RENAL AND URETERAL STONES  PROCEDURE:  Procedure(s): CYSTOSCOPY WITH LEFT RETROGRADE, LEFT URETEROSCOPY AND STENT PLACEMENT (Left) WITH HOLMIUM LASER  (Left)  SURGEON:  Surgeon(s) and Role:    * Irine Seal, MD - Primary  PHYSICIAN ASSISTANT:   ASSISTANTS: none   ANESTHESIA:   general  EBL:  Total I/O In: 1000 [I.V.:1000] Out: -   BLOOD ADMINISTERED:none  DRAINS: 6 x 26 left JJ stent   LOCAL MEDICATIONS USED:  NONE  SPECIMEN:  Source of Specimen:  stone fragments  DISPOSITION OF SPECIMEN:  given to patient to bring to the office.   COUNTS:  YES  TOURNIQUET:  * No tourniquets in log *  DICTATION: .Other Dictation: Dictation Number [id number]  PLAN OF CARE: Discharge to home after PACU  PATIENT DISPOSITION:  PACU - hemodynamically stable.   Delay start of Pharmacological VTE agent (>24hrs) due to surgical blood loss or risk of bleeding: no

## 2015-09-01 NOTE — Transfer of Care (Signed)
Immediate Anesthesia Transfer of Care Note  Patient: Teresa Kerr  Procedure(s) Performed: Procedure(s): CYSTOSCOPY WITH LEFT RETROGRADE, LEFT URETEROSCOPY AND STENT PLACEMENT (Left) WITH HOLMIUM LASER  (Left)  Patient Location: PACU  Anesthesia Type:General  Level of Consciousness: sedated  Airway & Oxygen Therapy: Patient Spontanous Breathing and Patient connected to face mask oxygen  Post-op Assessment: Report given to RN and Post -op Vital signs reviewed and stable  Post vital signs: Reviewed and stable  Last Vitals:  Filed Vitals:   09/01/15 1058  BP: 174/84  Pulse: 100  Temp: 36.4 C  Resp: 16    Complications: No apparent anesthesia complications

## 2015-09-01 NOTE — Anesthesia Postprocedure Evaluation (Signed)
Anesthesia Post Note  Patient: Teresa Kerr  Procedure(s) Performed: Procedure(s) (LRB): CYSTOSCOPY WITH LEFT RETROGRADE, LEFT URETEROSCOPY AND STENT PLACEMENT (Left) WITH HOLMIUM LASER  (Left)  Patient location during evaluation: PACU Anesthesia Type: General Level of consciousness: awake and alert Pain management: pain level controlled Vital Signs Assessment: post-procedure vital signs reviewed and stable Respiratory status: spontaneous breathing, nonlabored ventilation, respiratory function stable and patient connected to nasal cannula oxygen Cardiovascular status: blood pressure returned to baseline and stable Postop Assessment: no signs of nausea or vomiting Anesthetic complications: no    Last Vitals:  Filed Vitals:   09/01/15 1615 09/01/15 1630  BP: 145/91 133/98  Pulse: 92 81  Temp:    Resp: 16 16    Last Pain:  Filed Vitals:   09/01/15 1647  PainSc: 7                  Kebin Maye

## 2015-09-02 ENCOUNTER — Encounter (HOSPITAL_COMMUNITY): Payer: Self-pay | Admitting: Urology

## 2015-09-02 LAB — GLUCOSE, CAPILLARY: Glucose-Capillary: 266 mg/dL — ABNORMAL HIGH (ref 65–99)

## 2015-09-02 NOTE — Op Note (Signed)
Teresa Kerr, Teresa Kerr NO.:  [id number]  MEDICAL RECORD NO.:  [id number]  LOCATION:  [facility]                         FACILITY:  [facility]  PHYSICIAN:  Marshall Cork. Jeffie Pollock, M.D.    DATE OF BIRTH:  14-Sep-1951  DATE OF PROCEDURE:  09/01/2015 DATE OF DISCHARGE:  09/01/2015                              OPERATIVE REPORT   PROCEDURE: 1. Cystoscopy with left retrograde pyelogram with interpretation. 2. Left ureteroscopic stone extraction with holmium lasertripsy and     placement of left double-J stent.  PREOPERATIVE DIAGNOSIS:  Left renal and ureteral stones.  POSTOPERATIVE DIAGNOSIS:  Left renal stone.  SURGEON:  Marshall Cork. Jeffie Pollock, M.D.  ANESTHESIA:  General.  SPECIMEN:  Stone fragments.  DRAINS:  A 6-French x 26 cm left double-J stent.  BLOOD LOSS:  Minimal.  COMPLICATIONS:  None.  INDICATIONS:  Teresa Kerr is a 64 year old, white female, who recently presented with a 3 mm left proximal stone as well as a proximal 11 mm left renal stone.  After conferring with other medical doctors, we elected to stop her warfarin and continue Plavix for the ureteroscopic procedure that was felt to be most appropriate for her stones and medical issues.  FINDINGS OF PROCEDURE:  She was given Cipro.  She was taken to the operating room where general anesthetic was induced.  She was placed in lithotomy position.  Her perineum and genitalia were prepped with Betadine solution.  She was draped in usual sterile fashion.  Cystoscopy was performed using the 23-French scope in a 30-degree lens. Examination revealed some minor cystitis and cystic changes in the trigone.  The bladder had mild trabeculation.  No other mucosal lesions were noted.  Ureteral orifices were unremarkable. The left ureteral orifice was cannulated with 5-French open-end catheter and contrast was instilled.  This revealed a normal ureter, although there was a filling defect in the lower proximal ureter that proved to be  an air bubble and not the stone that she previously had the proximal ureter.  However, there was a triangular proximal 11 mm stone in the left renal pelvis as noted on prior scanning.  After completion of the retrograde pyelogram, a guidewire was passed to the kidney and a 38 cm digital access sheath inner core was passed over the wire to the kidney without difficulty.  After dilation with the inner core, the 6.5-French semi-rigid ureteroscope was passed.  This confirmed that the filling defects noted on retrograde and the proximal ureter were actually air bubbles.  I was able to advance the rigid scope all the way to the renal pelvis where the stone was identified.  I initially engaged it with the semirigid scope using a 200 micron tract tip fiber with the laser set on 0.8 watts and 50 hertz.  However, because of some partial fragmentation and relocation of stone, I then had to go to the flexible scope.  At this point, the rigid scope was removed.  The assembled access sheath was then reinserted into the kidney without difficulty.  The inner core and wire were removed and the dual-lumen digital flexible that was then passed through the sheath to the kidney.  The stone was then engaged with a 200 micron TracTip  laser fiber at 0.8 watts and 50 hertz and broken into manageable fragments.  There was some bleeding during the procedure, which is not surprising since she is on Plavix, but I was able to visualize adequately to complete the procedure.  Once the stones were adequately fragmented, several small fragments were removed using the [place] basket.  Once all sizable fragments removed, all the remains was dust and 1-2 mm fragments that could not be retrieved with a basket.  At this point, the guidewire was passed through the sheath to the kidney.  The sheath was removed.  The cystoscope was reinserted over the wire and a 26 cm 6-French left double-J stent was inserted over the  wire without difficulty.  The wire was removed leaving good coil in the kidney and good coil in the bladder.  The bladder was drained.  The cystoscope was removed.  The stone fragments were collected and will be given to the family.  Her anesthetic was reversed after she was taken down from lithotomy position, and then she was moved to recovery room in stable condition.  There were no complications.     Marshall Cork. Jeffie Pollock, M.D.     JJW/MEDQ  D:  09/01/2015  T:  09/02/2015  Job:  [id number]

## 2015-10-28 ENCOUNTER — Other Ambulatory Visit (HOSPITAL_COMMUNITY): Payer: Self-pay | Admitting: Interventional Radiology

## 2015-10-28 DIAGNOSIS — I771 Stricture of artery: Secondary | ICD-10-CM

## 2015-10-28 DIAGNOSIS — I6320 Cerebral infarction due to unspecified occlusion or stenosis of unspecified precerebral arteries: Secondary | ICD-10-CM

## 2015-11-10 ENCOUNTER — Ambulatory Visit (HOSPITAL_COMMUNITY)
Admission: RE | Admit: 2015-11-10 | Discharge: 2015-11-10 | Disposition: A | Discharge status: Home / Self Care | Payer: Medicare Other | Source: Ambulatory Visit | Attending: Interventional Radiology | Admitting: Interventional Radiology

## 2015-11-10 DIAGNOSIS — I6320 Cerebral infarction due to unspecified occlusion or stenosis of unspecified precerebral arteries: Secondary | ICD-10-CM

## 2015-11-10 DIAGNOSIS — I771 Stricture of artery: Secondary | ICD-10-CM

## 2015-11-17 ENCOUNTER — Other Ambulatory Visit (HOSPITAL_COMMUNITY): Payer: Self-pay | Admitting: Interventional Radiology

## 2015-11-17 DIAGNOSIS — I639 Cerebral infarction, unspecified: Secondary | ICD-10-CM

## 2015-11-17 DIAGNOSIS — I771 Stricture of artery: Secondary | ICD-10-CM

## 2015-12-02 ENCOUNTER — Ambulatory Visit (HOSPITAL_COMMUNITY): Payer: Medicare Other

## 2015-12-02 ENCOUNTER — Ambulatory Visit (HOSPITAL_COMMUNITY): Admission: RE | Admit: 2015-12-02 | Payer: Medicare Other | Source: Ambulatory Visit

## 2015-12-14 ENCOUNTER — Ambulatory Visit (HOSPITAL_COMMUNITY): Payer: Medicare Other

## 2015-12-14 ENCOUNTER — Other Ambulatory Visit (HOSPITAL_COMMUNITY): Payer: Self-pay | Admitting: Interventional Radiology

## 2015-12-14 ENCOUNTER — Ambulatory Visit (HOSPITAL_COMMUNITY)
Admission: RE | Admit: 2015-12-14 | Discharge: 2015-12-14 | Disposition: A | Discharge status: Home / Self Care | Payer: Medicare Other | Source: Ambulatory Visit | Attending: Interventional Radiology | Admitting: Interventional Radiology

## 2015-12-14 DIAGNOSIS — I771 Stricture of artery: Secondary | ICD-10-CM

## 2015-12-14 DIAGNOSIS — I639 Cerebral infarction, unspecified: Secondary | ICD-10-CM

## 2015-12-14 LAB — CREATININE, SERUM
Creatinine, Ser: 0.89 mg/dL (ref 0.44–1.00)
GFR calc Af Amer: >60 mL/min (ref >60)

## 2015-12-20 ENCOUNTER — Telehealth (HOSPITAL_COMMUNITY): Payer: Self-pay

## 2015-12-20 NOTE — Telephone Encounter (Signed)
Pt agreed to f/u in 1 yr with a MRA per Dr. Estanislado Pandy. AW

## 2016-04-04 ENCOUNTER — Encounter: Payer: Self-pay | Admitting: Specialist

## 2016-05-22 IMAGING — MR MR HEAD W/O CM
4 series · 27 of 48 positions shown · non-contrast
Comparison: Cerebral angiogram 03/13/2010. Brain MRI and
intracranial MRA 03/10/2010.

CLINICAL DATA: 64-year-old female with personal history of left MCA
stenosis status post angioplasty. Chronic left MCA and left MCA/PCA
watershed ischemia. Dizziness. Subsequent encounter.
TECHNIQUE: Multiplanar, multiecho pulse sequences of the brain and surrounding
structures were obtained without intravenous contrast. Angiographic
images of the head were obtained using MRA technique without
contrast.

[Series 3: DWI · axial · 3.6mm · 0.94mm/px · z∈[-74,+57]mm · 9 of 76 slices shown (1 of 2)]
[im 1/76]
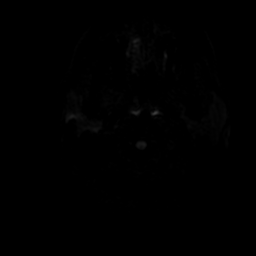
[im 14/76]
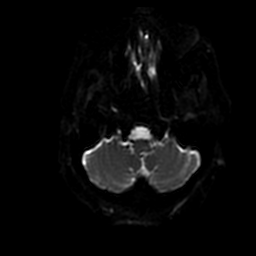
[im 21/76]
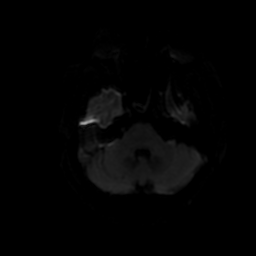
[im 35/76]
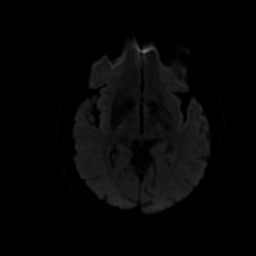
[im 41/76]
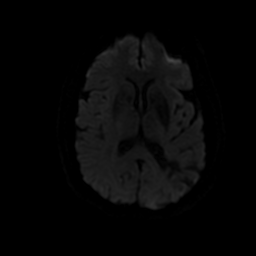
[im 55/76]
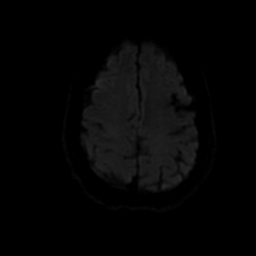
[im 62/76]
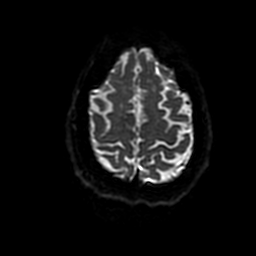
[im 69/76]
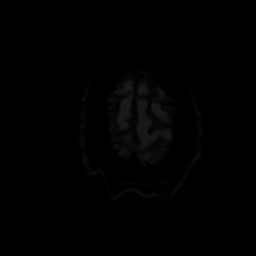
[im 76/76]
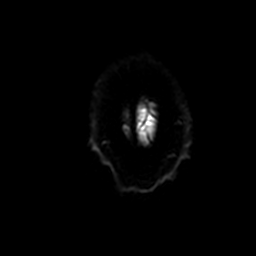

[Series 4: FLAIR · sagittal · 5.0mm · 0.47mm/px · 3 of 23 slices shown]
[im 1/23]
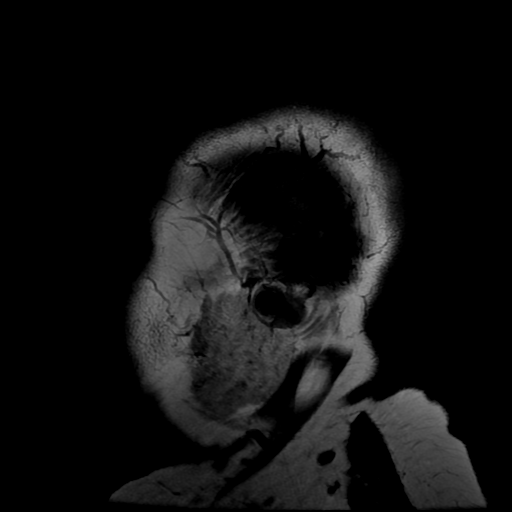
[im 12/23]
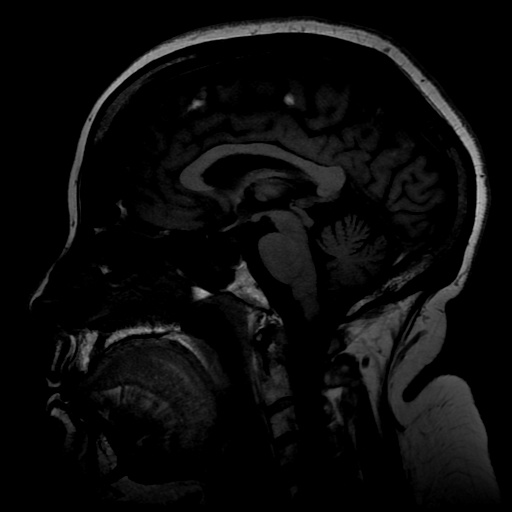
[im 23/23]
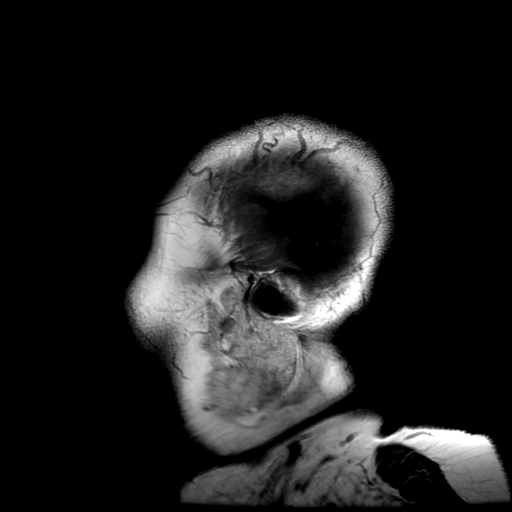

[Series 5: ax (id) 2 · axial · 1.0mm · 0.43mm/px · z∈[-93,-15]mm · 10 of 200 slices shown]
[im 8/200]
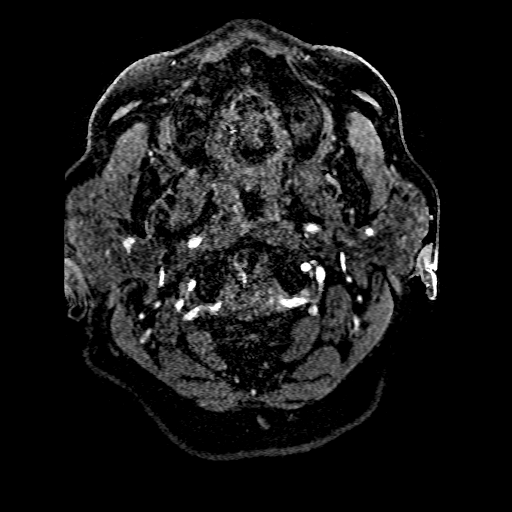
[im 30/200]
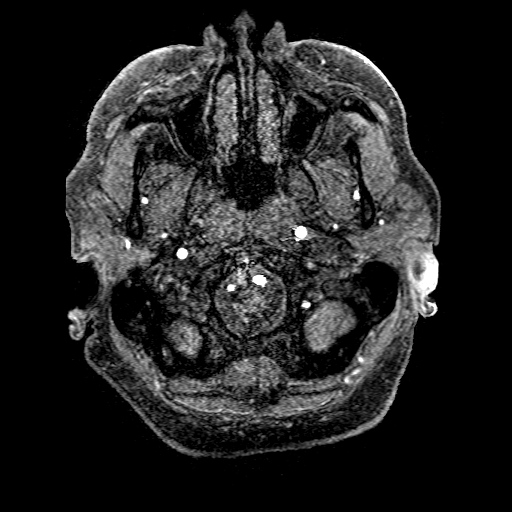
[im 37/200]
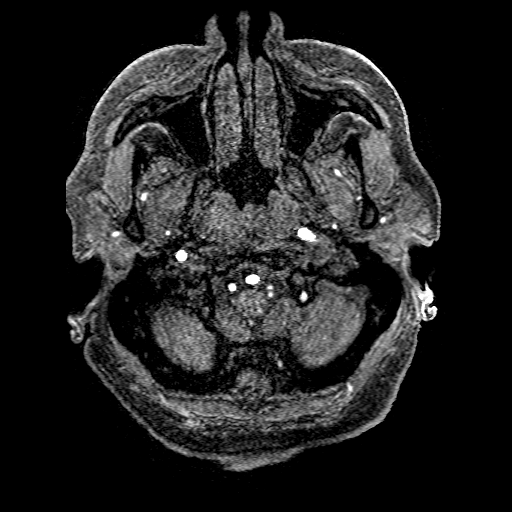
[im 59/200]
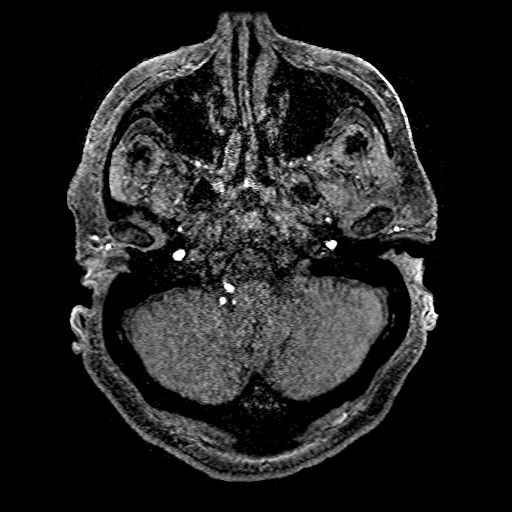
[im 89/200]
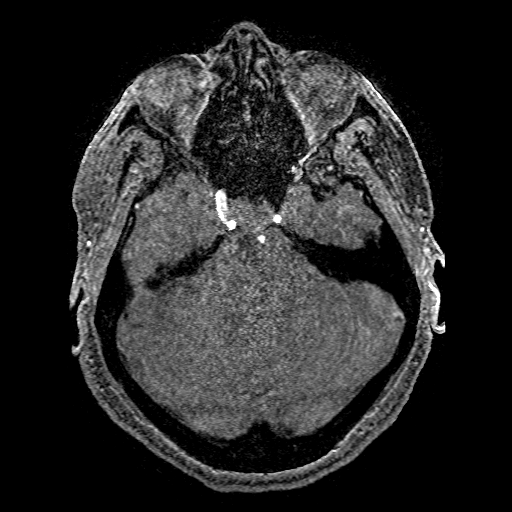
[im 104/200]
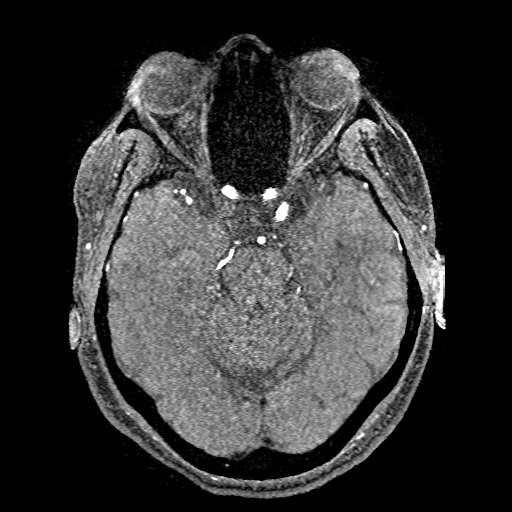
[im 111/200]
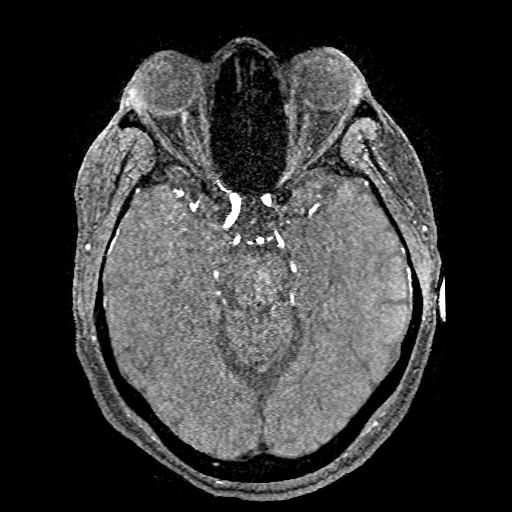
[im 141/200]
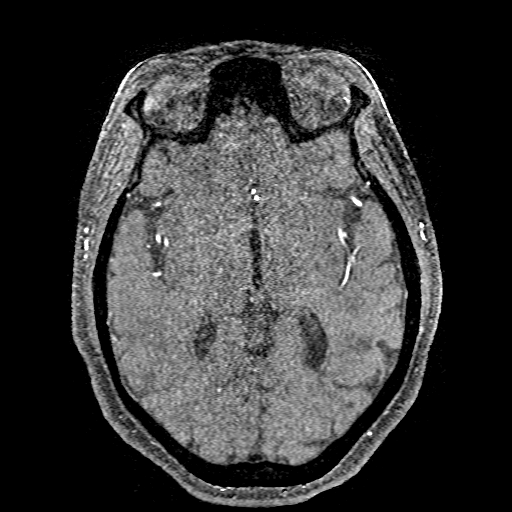
[im 163/200]
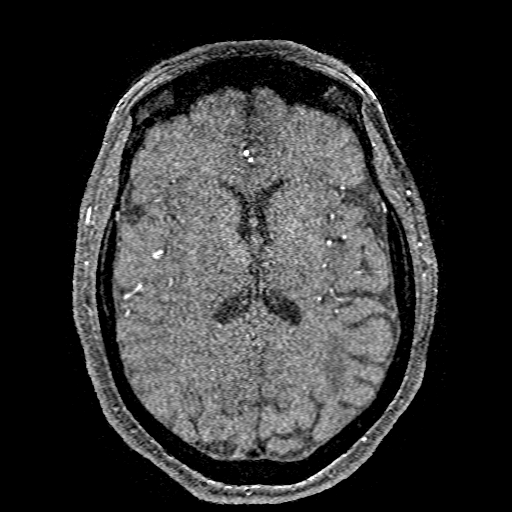
[im 170/200]
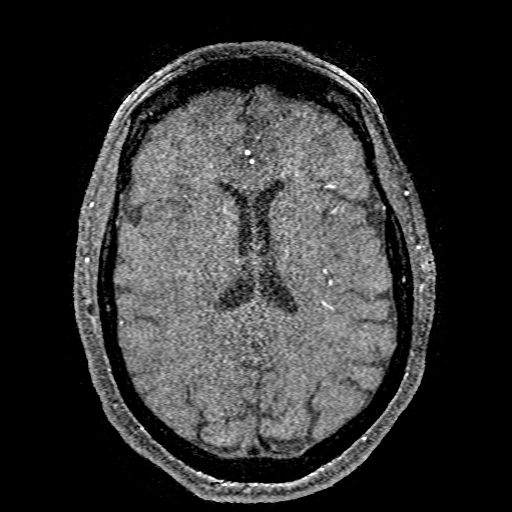

[Series 300: DWI · axial · 3.6mm · 0.94mm/px · z∈[-74,+57]mm · 5 of 38 slices shown (2 of 2)]
[im 1/38]
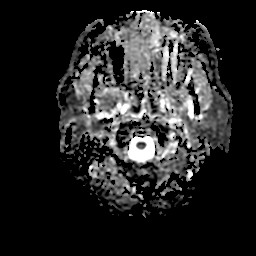
[im 10/38]
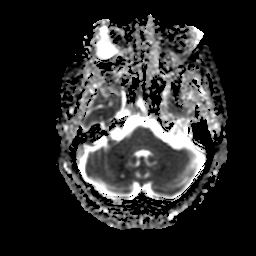
[im 19/38]
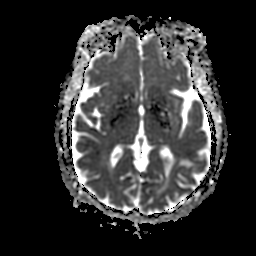
[im 28/38]
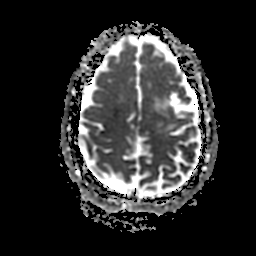
[im 38/38]
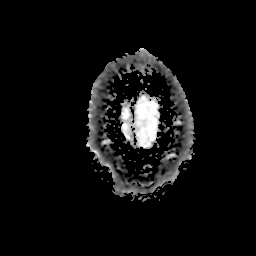

[27 of 48 positions shown; findings below may reference images not displayed]

The examination had to be discontinued prior to completion due to
the patient having acute asthma attack like symptoms. Subsequently,
only axial diffusion, sagittal T1 weighted imaging of the brain, and
intracranial MRA was obtained.

EXAM:
LIMITED MRI HEAD WITHOUT CONTRAST

MRA HEAD WITHOUT CONTRAST
FINDINGS: MRI HEAD FINDINGS

Cerebral volume appears stable since 6766. Partially empty sella. No
restricted diffusion to suggest acute infarction. No midline shift,
mass effect, evidence of mass lesion, ventriculomegaly, extra-axial
collection or acute intracranial hemorrhage. Cervicomedullary
junction within normal limits. Stable appearance on these images of
patchy left MCA and left MCA/PCA territory encephalomalacia.

Stable and negative visualized cervical spine. Stable bone marrow
signal.

MRA HEAD FINDINGS

Stable antegrade flow signal in the posterior circulation with
tortuous distal vertebral arteries, the left appears dominant. Both
PICA origins appear patent. No distal vertebral artery stenosis.
Stable vertebrobasilar and basilar artery. No basilar artery
stenosis. SCA and PCA origins appear stable and within normal
limits. Posterior communicating arteries are diminutive or absent.
Bilateral PCA branches are stable and within normal limits.

Stable antegrade flow in both ICA siphons. Tortuous distal cervical
left ICA. Stable mild cavernous segment siphon irregularity on the
left. No siphon stenosis. Ophthalmic artery origins remain normal.
Carotid termini remain patent.

Stable MCA and ACA origins, within normal limits. Diminutive
anterior communicating artery. Visualized bilateral ACA branches are
within normal limits. Right MCA M1 segment, tortuous right MCA M1
segment again noted. Mild stenosis in the distal right M1. Right MCA
bifurcation and visualized right MCA branches are within normal
limits.

Left MCA M1 segment is better demonstrated today and appears normal
without stenosis. Left MCA bifurcation is patent, and visualized
left MCA branches are within normal limits.
IMPRESSION: 1. The MRI portion of this examination had to be discontinued prior
to completion.
If the patient returns to continue and complete the pre and post
contrast MRI, then those images can all be reported under an MRI
Head WITH CONTRAST exam order at that time.
2. Largely unremarkable intracranial MRA. No residual or recurrent
proximal left MCA stenosis. Mild stenosis in the distal right MCA M1
segment.

## 2016-08-09 ENCOUNTER — Other Ambulatory Visit (HOSPITAL_COMMUNITY)
Admission: RE | Admit: 2016-08-09 | Discharge: 2016-08-09 | Disposition: A | Discharge status: Home / Self Care | Payer: Medicare Other | Source: Ambulatory Visit | Attending: Obstetrics & Gynecology | Admitting: Obstetrics & Gynecology

## 2016-08-09 ENCOUNTER — Other Ambulatory Visit: Payer: Self-pay | Admitting: Obstetrics & Gynecology

## 2016-08-09 DIAGNOSIS — Z1151 Encounter for screening for human papillomavirus (HPV): Secondary | ICD-10-CM | POA: Insufficient documentation

## 2016-08-09 DIAGNOSIS — Z01419 Encounter for gynecological examination (general) (routine) without abnormal findings: Secondary | ICD-10-CM | POA: Diagnosis present

## 2016-08-13 LAB — CYTOLOGY - PAP
Diagnosis: NEGATIVE
HPV: NOT DETECTED

## 2016-11-22 ENCOUNTER — Other Ambulatory Visit (HOSPITAL_COMMUNITY): Payer: Self-pay | Admitting: Interventional Radiology

## 2016-11-22 DIAGNOSIS — I771 Stricture of artery: Secondary | ICD-10-CM

## 2016-11-29 ENCOUNTER — Ambulatory Visit (HOSPITAL_COMMUNITY): Admission: RE | Admit: 2016-11-29 | Payer: Medicare Other | Source: Ambulatory Visit

## 2016-11-30 ENCOUNTER — Telehealth (HOSPITAL_COMMUNITY): Payer: Self-pay

## 2016-11-30 NOTE — Telephone Encounter (Signed)
Called to have pt come in at 2pm for her mri appt on 12/06/16. Left message for pt to return call. AW

## 2016-12-06 ENCOUNTER — Ambulatory Visit (HOSPITAL_COMMUNITY)
Admission: RE | Admit: 2016-12-06 | Discharge: 2016-12-06 | Disposition: A | Discharge status: Home / Self Care | Payer: Medicare Other | Source: Ambulatory Visit | Attending: Interventional Radiology | Admitting: Interventional Radiology

## 2016-12-06 ENCOUNTER — Encounter (HOSPITAL_COMMUNITY): Payer: Self-pay

## 2016-12-06 DIAGNOSIS — I771 Stricture of artery: Secondary | ICD-10-CM

## 2016-12-06 NOTE — Progress Notes (Signed)
Pt came to MR according to the chart to complete her brain and contrasted images after failing to complete her last exam due to a panic attack.  The order we had was for a brain mra only.  Per the MR report, Dr Nevada Crane said if she came back that a brain/w only should be ordered and that we should complete the brain and give gad and do post images.  We called Caryl Pina and left a msg but after over an hour we had not heard back.  Pt in the meantime said she was too nervous and refused exam and was irritated that meds were not ordered for her and ready for her to take upon her arrival.  So if pt is rescheduled, the order needs to be changed to Brain/w contrast and she will need something for sedation as patient cannot tolerate even being inside the MR room without having a panic attack.

## 2016-12-20 ENCOUNTER — Telehealth (HOSPITAL_COMMUNITY): Payer: Self-pay

## 2016-12-20 NOTE — Telephone Encounter (Signed)
Pt agreed to f/u in 1 yr with mri/mra. AW 

## 2017-01-16 ENCOUNTER — Observation Stay (HOSPITAL_COMMUNITY)
Admission: EM | Admit: 2017-01-16 | Discharge: 2017-01-18 | Disposition: A | Discharge status: Home / Self Care | Payer: Medicare Other | Attending: Family Medicine | Admitting: Family Medicine

## 2017-01-16 ENCOUNTER — Emergency Department (HOSPITAL_COMMUNITY): Payer: Medicare Other

## 2017-01-16 ENCOUNTER — Encounter (HOSPITAL_COMMUNITY): Payer: Self-pay

## 2017-01-16 DIAGNOSIS — I4510 Unspecified right bundle-branch block: Secondary | ICD-10-CM | POA: Insufficient documentation

## 2017-01-16 DIAGNOSIS — M109 Gout, unspecified: Secondary | ICD-10-CM | POA: Insufficient documentation

## 2017-01-16 DIAGNOSIS — E1165 Type 2 diabetes mellitus with hyperglycemia: Secondary | ICD-10-CM | POA: Diagnosis not present

## 2017-01-16 DIAGNOSIS — E119 Type 2 diabetes mellitus without complications: Secondary | ICD-10-CM

## 2017-01-16 DIAGNOSIS — E1140 Type 2 diabetes mellitus with diabetic neuropathy, unspecified: Secondary | ICD-10-CM | POA: Diagnosis not present

## 2017-01-16 DIAGNOSIS — F419 Anxiety disorder, unspecified: Secondary | ICD-10-CM | POA: Diagnosis not present

## 2017-01-16 DIAGNOSIS — J441 Chronic obstructive pulmonary disease with (acute) exacerbation: Secondary | ICD-10-CM

## 2017-01-16 DIAGNOSIS — E118 Type 2 diabetes mellitus with unspecified complications: Secondary | ICD-10-CM | POA: Diagnosis not present

## 2017-01-16 DIAGNOSIS — R079 Chest pain, unspecified: Secondary | ICD-10-CM

## 2017-01-16 DIAGNOSIS — Z780 Asymptomatic menopausal state: Secondary | ICD-10-CM | POA: Insufficient documentation

## 2017-01-16 DIAGNOSIS — R0789 Other chest pain: Secondary | ICD-10-CM | POA: Diagnosis not present

## 2017-01-16 DIAGNOSIS — J301 Allergic rhinitis due to pollen: Secondary | ICD-10-CM | POA: Diagnosis present

## 2017-01-16 DIAGNOSIS — I214 Non-ST elevation (NSTEMI) myocardial infarction: Secondary | ICD-10-CM | POA: Insufficient documentation

## 2017-01-16 DIAGNOSIS — Z794 Long term (current) use of insulin: Secondary | ICD-10-CM | POA: Diagnosis not present

## 2017-01-16 DIAGNOSIS — I10 Essential (primary) hypertension: Secondary | ICD-10-CM

## 2017-01-16 DIAGNOSIS — Z7951 Long term (current) use of inhaled steroids: Secondary | ICD-10-CM | POA: Diagnosis not present

## 2017-01-16 DIAGNOSIS — J101 Influenza due to other identified influenza virus with other respiratory manifestations: Secondary | ICD-10-CM | POA: Diagnosis present

## 2017-01-16 DIAGNOSIS — Z91040 Latex allergy status: Secondary | ICD-10-CM | POA: Diagnosis not present

## 2017-01-16 DIAGNOSIS — J9601 Acute respiratory failure with hypoxia: Secondary | ICD-10-CM | POA: Diagnosis not present

## 2017-01-16 DIAGNOSIS — G709 Myoneural disorder, unspecified: Secondary | ICD-10-CM | POA: Diagnosis not present

## 2017-01-16 DIAGNOSIS — Z6838 Body mass index (BMI) 38.0-38.9, adult: Secondary | ICD-10-CM | POA: Diagnosis not present

## 2017-01-16 DIAGNOSIS — Z87891 Personal history of nicotine dependence: Secondary | ICD-10-CM | POA: Diagnosis not present

## 2017-01-16 DIAGNOSIS — Z8673 Personal history of transient ischemic attack (TIA), and cerebral infarction without residual deficits: Secondary | ICD-10-CM | POA: Insufficient documentation

## 2017-01-16 DIAGNOSIS — J111 Influenza due to unidentified influenza virus with other respiratory manifestations: Secondary | ICD-10-CM

## 2017-01-16 DIAGNOSIS — E785 Hyperlipidemia, unspecified: Secondary | ICD-10-CM | POA: Diagnosis not present

## 2017-01-16 DIAGNOSIS — E041 Nontoxic single thyroid nodule: Secondary | ICD-10-CM | POA: Insufficient documentation

## 2017-01-16 DIAGNOSIS — E669 Obesity, unspecified: Secondary | ICD-10-CM | POA: Diagnosis present

## 2017-01-16 DIAGNOSIS — R0781 Pleurodynia: Secondary | ICD-10-CM | POA: Insufficient documentation

## 2017-01-16 DIAGNOSIS — J45909 Unspecified asthma, uncomplicated: Secondary | ICD-10-CM | POA: Diagnosis present

## 2017-01-16 DIAGNOSIS — Z7902 Long term (current) use of antithrombotics/antiplatelets: Secondary | ICD-10-CM | POA: Diagnosis not present

## 2017-01-16 DIAGNOSIS — R748 Abnormal levels of other serum enzymes: Secondary | ICD-10-CM

## 2017-01-16 DIAGNOSIS — Z7901 Long term (current) use of anticoagulants: Secondary | ICD-10-CM | POA: Insufficient documentation

## 2017-01-16 LAB — BASIC METABOLIC PANEL
ANION GAP: 10 (ref 5–15)
BUN: 13 mg/dL (ref 6–20)
CO2: 24 mmol/L (ref 22–32)
Calcium: 8.9 mg/dL (ref 8.9–10.3)
Chloride: 103 mmol/L (ref 101–111)
Creatinine, Ser: 0.93 mg/dL (ref 0.44–1.00)
GFR calc Af Amer: >60 mL/min (ref >60)
GLUCOSE: 236 mg/dL — AB (ref 65–99)
POTASSIUM: 4.3 mmol/L (ref 3.5–5.1)
Sodium: 137 mmol/L (ref 135–145)

## 2017-01-16 LAB — I-STAT TROPONIN, ED: Troponin i, poc: 0.32 ng/mL (ref 0.00–0.08)

## 2017-01-16 LAB — PROTIME-INR
INR: 1.01
Prothrombin Time: 13.3 seconds (ref 11.4–15.2)

## 2017-01-16 LAB — GLUCOSE, CAPILLARY
Glucose-Capillary: 594 mg/dL (ref 65–99)
Glucose-Capillary: >600 mg/dL (ref 65–99)

## 2017-01-16 LAB — CBC
HEMATOCRIT: 45.6 % (ref 36.0–46.0)
HEMOGLOBIN: 15.6 g/dL — AB (ref 12.0–15.0)
MCH: 29.1 pg (ref 26.0–34.0)
MCHC: 34.2 g/dL (ref 30.0–36.0)
MCV: 84.9 fL (ref 78.0–100.0)
Platelets: 283 10*3/uL (ref 150–400)
RBC: 5.37 MIL/uL — ABNORMAL HIGH (ref 3.87–5.11)
RDW: 12.7 % (ref 11.5–15.5)
WBC: 10.9 10*3/uL — ABNORMAL HIGH (ref 4.0–10.5)

## 2017-01-16 LAB — I-STAT CG4 LACTIC ACID, ED
LACTIC ACID, VENOUS: 1.84 mmol/L (ref 0.5–1.9)
LACTIC ACID, VENOUS: 1.51 mmol/L (ref 0.5–1.9)

## 2017-01-16 LAB — INFLUENZA PANEL BY PCR (TYPE A & B)
INFLAPCR: NEGATIVE
INFLBPCR: POSITIVE — AB

## 2017-01-16 LAB — TROPONIN I: Troponin I: 0.25 ng/mL (ref <0.03)

## 2017-01-16 LAB — BRAIN NATRIURETIC PEPTIDE: B NATRIURETIC PEPTIDE 5: 118.4 pg/mL — AB (ref 0.0–100.0)

## 2017-01-16 MED ORDER — FUROSEMIDE 20 MG PO TABS
20.0000 mg | ORAL_TABLET | Freq: Every day | ORAL | Status: DC
  Administered 2017-01-16 – 2017-01-18 (×3): 20 mg via ORAL
  Filled 2017-01-16 (×3): qty 1

## 2017-01-16 MED ORDER — IOPAMIDOL (ISOVUE-370) INJECTION 76%
INTRAVENOUS | Status: AC
  Administered 2017-01-16: 100 mL
  Filled 2017-01-16: qty 100

## 2017-01-16 MED ORDER — ASPIRIN 81 MG PO CHEW
324.0000 mg | CHEWABLE_TABLET | Freq: Once | ORAL | Status: AC
  Administered 2017-01-16: 324 mg via ORAL
  Filled 2017-01-16: qty 4

## 2017-01-16 MED ORDER — MELOXICAM 7.5 MG PO TABS
15.0000 mg | ORAL_TABLET | Freq: Every day | ORAL | Status: DC
  Administered 2017-01-17 – 2017-01-18 (×2): 15 mg via ORAL
  Filled 2017-01-16 (×2): qty 2

## 2017-01-16 MED ORDER — METHYLPREDNISOLONE SODIUM SUCC 125 MG IJ SOLR
125.0000 mg | Freq: Once | INTRAMUSCULAR | Status: AC
Start: 2017-01-16 — End: 2017-01-16
  Administered 2017-01-16: 125 mg via INTRAVENOUS
  Filled 2017-01-16: qty 2

## 2017-01-16 MED ORDER — OSELTAMIVIR PHOSPHATE 75 MG PO CAPS
75.0000 mg | ORAL_CAPSULE | Freq: Two times a day (BID) | ORAL | Status: DC
  Administered 2017-01-17 – 2017-01-18 (×3): 75 mg via ORAL
  Filled 2017-01-16 (×4): qty 1

## 2017-01-16 MED ORDER — GABAPENTIN 300 MG PO CAPS
300.0000 mg | ORAL_CAPSULE | Freq: Two times a day (BID) | ORAL | Status: DC
  Administered 2017-01-16 – 2017-01-18 (×4): 300 mg via ORAL
  Filled 2017-01-16 (×4): qty 1

## 2017-01-16 MED ORDER — ALLOPURINOL 100 MG PO TABS
100.0000 mg | ORAL_TABLET | Freq: Every day | ORAL | Status: DC
  Administered 2017-01-17 – 2017-01-18 (×2): 100 mg via ORAL
  Filled 2017-01-16 (×2): qty 1

## 2017-01-16 MED ORDER — INSULIN DETEMIR 100 UNIT/ML ~~LOC~~ SOLN
40.0000 [IU] | Freq: Every day | SUBCUTANEOUS | Status: DC
  Administered 2017-01-16: 40 [IU] via SUBCUTANEOUS
  Filled 2017-01-16: qty 0.4

## 2017-01-16 MED ORDER — IPRATROPIUM-ALBUTEROL 0.5-2.5 (3) MG/3ML IN SOLN: 3.0000 mL | RESPIRATORY_TRACT | Status: DC | PRN

## 2017-01-16 MED ORDER — ACETAMINOPHEN 500 MG PO TABS
1000.0000 mg | ORAL_TABLET | Freq: Once | ORAL | Status: AC
  Administered 2017-01-16: 1000 mg via ORAL
  Filled 2017-01-16: qty 2

## 2017-01-16 MED ORDER — IPRATROPIUM-ALBUTEROL 0.5-2.5 (3) MG/3ML IN SOLN
3.0000 mL | Freq: Once | RESPIRATORY_TRACT | Status: AC
  Administered 2017-01-16: 3 mL via RESPIRATORY_TRACT
  Filled 2017-01-16: qty 3

## 2017-01-16 MED ORDER — OSELTAMIVIR PHOSPHATE 75 MG PO CAPS
75.0000 mg | ORAL_CAPSULE | Freq: Once | ORAL | Status: AC
  Administered 2017-01-16: 75 mg via ORAL
  Filled 2017-01-16: qty 1

## 2017-01-16 MED ORDER — PANTOPRAZOLE SODIUM 40 MG PO TBEC
40.0000 mg | DELAYED_RELEASE_TABLET | Freq: Every day | ORAL | Status: DC
  Administered 2017-01-16 – 2017-01-18 (×3): 40 mg via ORAL
  Filled 2017-01-16 (×3): qty 1

## 2017-01-16 MED ORDER — ATORVASTATIN CALCIUM 10 MG PO TABS
10.0000 mg | ORAL_TABLET | Freq: Every day | ORAL | Status: DC
  Administered 2017-01-16 – 2017-01-18 (×3): 10 mg via ORAL
  Filled 2017-01-16 (×3): qty 1

## 2017-01-16 MED ORDER — ACETAMINOPHEN 325 MG PO TABS
325.0000 mg | ORAL_TABLET | Freq: Four times a day (QID) | ORAL | Status: DC | PRN
  Administered 2017-01-17 (×2): 650 mg via ORAL
  Filled 2017-01-16 (×2): qty 2

## 2017-01-16 MED ORDER — NITROGLYCERIN 2 % TD OINT
0.5000 [in_us] | TOPICAL_OINTMENT | Freq: Once | TRANSDERMAL | Status: AC
  Administered 2017-01-16: 0.5 [in_us] via TOPICAL
  Filled 2017-01-16: qty 1

## 2017-01-16 MED ORDER — CLOPIDOGREL BISULFATE 75 MG PO TABS
75.0000 mg | ORAL_TABLET | Freq: Every day | ORAL | Status: DC
  Administered 2017-01-16 – 2017-01-18 (×3): 75 mg via ORAL
  Filled 2017-01-16 (×3): qty 1

## 2017-01-16 NOTE — ED Notes (Signed)
Report attempted x 1

## 2017-01-16 NOTE — Consult Note (Addendum)
CARDIOLOGY CONSULT NOTE  Patient ID: Teresa Kerr MRN: [id number] DOB/AGE: 1950-12-12 66 y.o.  Admit date: 01/16/2017 Primary Physician   Lillia Corporal, MD Primary Cardiologist None Chief Complaint  Chest pain Requesting   Sharene Butters, PA-C  HPI:  We are called by Sharene Butters to see this patient with chest pain.   She has had a fever, cough, headache and left axillary pain.  This has been sharp and worse with inspiration.  She has had fatigue, low grade fever and production of yellow phlegm.  She has had increased SOB and wheezing.    In the ED she is noted to have a positive influenza B PCR.   She does have an elevated troponin.  She was treated with Solumedrol and Duoneb.  She also received NTG and ASA.   EKG was not acute with chronic RBBB.  She did not have a pulmonary embolism.    The patient denies any new symptoms such as chest discomfort, neck or arm discomfort. There has been no new PND or orthopnea. There have been no reported palpitations, presyncope or syncope.  Past Medical History:  Diagnosis Date  . Asthma   . Diabetes mellitus   . Gout   . Hypertension   . Neuromuscular disorder ([place])    right sides weakness arm and leg, steroid injections to right shoulder-Dr. Beane(last 8'16)  . Shortness of breath dyspnea    mild only with "recent cold"  . Stroke [facility]) 2002, 1999   pt has right sided weakness at times, gets ESI when weakness returns  . Umbilical hernia   . Uterine fibroid     Past Surgical History:  Procedure Laterality Date  . ANGIOPLASTY     x 2 times-cerebral arterial stents- [place]  . BRAIN SURGERY     "collapsed vessel" in brain  . CYSTOSCOPY WITH RETROGRADE PYELOGRAM, URETEROSCOPY AND STENT PLACEMENT Left 09/01/2015   Procedure: CYSTOSCOPY WITH LEFT RETROGRADE, LEFT URETEROSCOPY AND STENT PLACEMENT;  Surgeon: Irine Seal, MD;  Location: [facility];  Service: Urology;  Laterality: Left;  . HAND SURGERY    . HOLMIUM LASER APPLICATION Left  16/0/7371   Procedure: WITH HOLMIUM LASER ;  Surgeon: Irine Seal, MD;  Location: [facility];  Service: Urology;  Laterality: Left;  . TOE SURGERY      Allergies  Allergen Reactions  . Latex Itching and Rash    RE: "Powdered gloves"     (Not in a hospital admission) Family History  Problem Relation Age of Onset  . Cancer Mother     breast/ had mastecomy  . Cancer Father     colon    Social History   Social History  . Marital status: Widowed    Spouse name: N/A  . Number of children: N/A  . Years of education: N/A   Occupational History  . Not on file.   Social History Main Topics  . Smoking status: Former Smoker    Packs/day: 1.50    Years: 34.00    Quit date: 09/24/1997  . Smokeless tobacco: Never Used  . Alcohol use No  . Drug use: No  . Sexual activity: Not Currently    Birth control/ protection: Post-menopausal   Other Topics Concern  . Not on file   Social History Narrative  . No narrative on file     ROS:    As stated in the HPI and negative for all other systems.  Physical Exam: Blood pressure 112/83, pulse 98, temperature (!) 100.4  F (38 C), temperature source Oral, resp. rate (!) 28, SpO2 95 %.  GENERAL:  Well appearing HEENT:  Pupils equal round and reactive, fundi not visualized, oral mucosa unremarkable NECK:  No jugular venous distention, waveform within normal limits, carotid upstroke brisk and symmetric, no bruits, no thyromegaly LYMPHATICS:  No cervical, inguinal adenopathy LUNGS:  Decreased breath sounds with wheezing. BACK:  No CVA tenderness CHEST:  Unremarkable HEART:  PMI not displaced or sustained,S1 and S2 within normal limits, no S3, no S4, no clicks, no rubs, no murmurs ABD:  Flat, positive bowel sounds normal in frequency in pitch, no bruits, no rebound, no guarding, no midline pulsatile mass, no hepatomegaly, no splenomegaly EXT:  2 plus pulses throughout, no edema, no cyanosis no clubbing SKIN:  No rashes no nodules NEURO:  Cranial  nerves II through XII grossly intact, motor grossly intact throughout PSYCH:  Cognitively intact, oriented to person place and time   Labs: Lab Results  Component Value Date   BUN 13 01/16/2017   Lab Results  Component Value Date   CREATININE 0.93 01/16/2017   Lab Results  Component Value Date   NA 137 01/16/2017   K 4.3 01/16/2017   CL 103 01/16/2017   CO2 24 01/16/2017   No results found for: TROPONINI Lab Results  Component Value Date   WBC 10.9 (H) 01/16/2017   HGB 15.6 (H) 01/16/2017   HCT 45.6 01/16/2017   MCV 84.9 01/16/2017   PLT 283 01/16/2017   Lab Results  Component Value Date   CHOL 125 09/27/2009   HDL 41 09/27/2009   LDLCALC 57 09/27/2009   TRIG 137 09/27/2009   CHOLHDL 3.0 Ratio 09/27/2009   Lab Results  Component Value Date   ALT 30 08/09/2015   AST 30 08/09/2015   ALKPHOS 61 08/09/2015   BILITOT 0.4 08/09/2015      Radiology:   CXR: Normal heart size when accounting for lower mediastinal fat. Negative aortic and hilar contours. There is no edema, consolidation, effusion, or pneumothorax. Pleural based nodular density inferiorly on the right is stable from 2013, likely posttraumatic based on chest CT. Nodular density left apex from EKG pad.  EKG:   Sinus tachycardia.  RAD, RBBB, no acute ST T wave changes. 01/16/2017  ASSESSMENT AND PLAN:   CHEST PAIN:  This is atypical and non anginal.  I suspect that the elevated troponin is secondary to demand ischemia in given her acute illness with the flu.  I will follow up with an echocardiogram.  Cycle enzymes.  I will likely plan a non invasive evaluation.  ACUTE RESPIRATORY DISTRESS:   Per the primary team.  Related to flu.  DM:  Per primary team.   Her last A1C in our records was 12.2.  She reports that her primary MD has recently increased her diabetes.  She understands the need for good BS control.   RBBB:  This is chronic.  No further work up for this.   ELEVATED TROPONIN:  She is found to  have coronary calcification on CT.  This will be evaluated as above.     SignedMinus Breeding 01/16/2017, 5:17 PM

## 2017-01-16 NOTE — Progress Notes (Signed)
Patient refused bed alarm. Will continue to monitor patient. 

## 2017-01-16 NOTE — H&P (Signed)
History and Physical    Teresa Kerr DDU:[id number] DOB: Feb 20, 1951 DOA: 01/16/2017   PCP: Sherilyn Cooter, NP   Patient coming from:  Home    Chief Complaint: Pleuritic chest pain   HPI: [name] is a 66 y.o. female with medical history significant for remote stroke not on coumadin over the last 6 months, recurrent, 1st in 1999, the other one in 2002, COPD/ asthma, diabetes, hypertensionpresenting with left-sided pleuritic chest pain, and shortness of breath, with symptoms beginning 2 days ago, progressively worse, keeping her from sleeping. The pain was worse with breathing. She denies any palpitations. She did report productive yellow phlegm. She also reported sweats and chills, with fevers up to 102 currently 100.4. She is not aware of sick contacts. No recent long distance trips. She is compliant with her mediter than right chronic lower extremity swelling, but denies any calf pain at this time. Denies nausea, heartburn or change in bowel habits. Denies abdominal pain. Appetite is normal. Denies any dysurications.She reports left greaa. Denies abnormal skin rashes Denies any bleeding issues such as epistaxis, hematemesis, hematuria or hematochezia. She does not smoke, drink alcohol or partakes recreational drug use.  ED Course:  BP 112/83   Pulse 98   Temp (!) 100.4 F (38 C) (Oral)   Resp (!) 28   SpO2 95%   CMET in CBC within normal limits. Influenza by PCR is positive white count 10.9 hemoglobin 15.6  troponin 0.32 EKGSinus tachycardia RBBB and LPFB Probable inferior infarct, recent Lateral leads are also involved No acute changes. QTC 517 CT angiography chest, and chest x-ray negative for acute disease, or PE.  BNP pending lactic acid pending received Solu-Medrol 125 mgx1, Duoneb x1  With improvement of dyspnea SHe received NTG and ASA with some improvement of her CP Tamiflu initiated     Review of Systems: As per HPI otherwise 10 point review of systems  negative.   Past Medical History:  Diagnosis Date  . Asthma   . Diabetes mellitus   . Gout   . Hypertension   . Neuromuscular disorder ([place])    right sides weakness arm and leg, steroid injections to right shoulder-Dr. Beane(last 8'16)  . Shortness of breath dyspnea    mild only with "recent cold"  . Stroke [name]) 2002, 1999   pt has right sided weakness at times, gets ESI when weakness returns  . Umbilical hernia   . Uterine fibroid     Past Surgical History:  Procedure Laterality Date  . ANGIOPLASTY     x 2 times-cerebral arterial stents- [place]  . BRAIN SURGERY     "collapsed vessel" in brain  . CYSTOSCOPY WITH RETROGRADE PYELOGRAM, URETEROSCOPY AND STENT PLACEMENT Left 09/01/2015   Procedure: CYSTOSCOPY WITH LEFT RETROGRADE, LEFT URETEROSCOPY AND STENT PLACEMENT;  Surgeon: Irine Seal, MD;  Location: [facility];  Service: Urology;  Laterality: Left;  . HAND SURGERY    . HOLMIUM LASER APPLICATION Left 23/03/6282   Procedure: WITH HOLMIUM LASER ;  Surgeon: Irine Seal, MD;  Location: [facility];  Service: Urology;  Laterality: Left;  . TOE SURGERY      Social History Social History   Social History  . Marital status: Widowed    Spouse name: N/A  . Number of children: N/A  . Years of education: N/A   Occupational History  . Not on file.   Social History Main Topics  . Smoking status: Former Smoker    Packs/day: 1.50    Years: 34.00  Quit date: 09/24/1997  . Smokeless tobacco: Never Used  . Alcohol use No  . Drug use: No  . Sexual activity: Not Currently    Birth control/ protection: Post-menopausal   Other Topics Concern  . Not on file   Social History Narrative  . No narrative on file     Allergies  Allergen Reactions  . Latex Itching and Rash    RE: "Powdered gloves"     Family History  Problem Relation Age of Onset  . Cancer Mother     breast/ had mastecomy  . Cancer Father     colon      Prior to Admission medications   Medication Sig Start  Date End Date Taking? Authorizing Provider  acetaminophen (TYLENOL) 325 MG tablet Take 325-650 mg by mouth every 6 (six) hours as needed for mild pain, moderate pain, fever or headache.    Historical Provider, MD  allopurinol (ZYLOPRIM) 100 MG tablet Take 100 mg by mouth daily. 01/04/17   Historical Provider, MD  amLODipine (NORVASC) 10 MG tablet Take 10 mg by mouth daily.      Historical Provider, MD  atorvastatin (LIPITOR) 10 MG tablet Take 10 mg by mouth daily at 6 PM.  01/25/15   Historical Provider, MD  cloNIDine (CATAPRES) 0.1 MG tablet Take 0.1 mg by mouth 2 (two) times daily. 08/22/15   Historical Provider, MD  clopidogrel (PLAVIX) 75 MG tablet Take 75 mg by mouth daily.  05/01/12   Historical Provider, MD  colchicine 0.6 MG tablet Take 0.6 mg by mouth See admin instructions. 1 tablet daily. In case of gout attack, take 2 tablets and take another 1 tablet one hour later.    Historical Provider, MD  DEXILANT 30 MG capsule Take 30 mg by mouth daily. 11/26/16   Historical Provider, MD  Fluticasone-Salmeterol (ADVAIR) 250-50 MCG/DOSE AEPB Inhale 2 puffs into the lungs as directed.     Historical Provider, MD  furosemide (LASIX) 20 MG tablet Take 20 mg by mouth daily.     Historical Provider, MD  gabapentin (NEURONTIN) 300 MG capsule Take 300 mg by mouth 2 (two) times daily.    Historical Provider, MD  ibuprofen (ADVIL,MOTRIN) 200 MG tablet Take 1 tablet (200 mg total) by mouth every 6 (six) hours as needed. 08/09/15   Hoyle Sauer, MD  LEVEMIR FLEXTOUCH 100 UNIT/ML Pen Inject 24 Units into the skin at bedtime.  12/27/14   Historical Provider, MD  lisinopril (PRINIVIL,ZESTRIL) 40 MG tablet Take 40 mg by mouth daily.  01/05/15   Historical Provider, MD  meloxicam (MOBIC) 15 MG tablet Take 15 mg by mouth daily. 01/04/17   Historical Provider, MD  mometasone (NASONEX) 50 MCG/ACT nasal spray Place 2 sprays into the nose daily. 2 sprays in each nostril 06/10/15   Historical Provider, MD  ondansetron (ZOFRAN) 4  MG tablet Take 1 tablet (4 mg total) by mouth every 8 (eight) hours as needed for nausea or vomiting. 08/09/15   Hoyle Sauer, MD  oxyCODONE-acetaminophen (PERCOCET/ROXICET) 5-325 MG tablet Take 1 tablet by mouth every 4 (four) hours as needed for severe pain. 09/01/15   Irine Seal, MD  phenazopyridine (PYRIDIUM) 200 MG tablet Take 1 tablet (200 mg total) by mouth 3 (three) times daily as needed for pain. Can get OTC AZO if cheaper 09/01/15   Irine Seal, MD  [facility] [address] [phone] BASE) MCG/ACT AEPB Inhale 2 puffs into the lungs every 6 (six) hours as needed (shortness of breath).  10/25/14  Historical Provider, MD  promethazine (PHENERGAN) 25 MG tablet Take 1 tablet (25 mg total) by mouth every 6 (six) hours as needed for refractory nausea / vomiting. 08/09/15   Hoyle Sauer, MD  tiZANidine (ZANAFLEX) 2 MG tablet Take 2 mg by mouth 2 (two) times daily. 01/04/17   Historical Provider, MD  warfarin (COUMADIN) 7.5 MG tablet Take 7.5 mg by mouth daily.    Historical Provider, MD    Physical Exam:  Vitals:   01/16/17 1515 01/16/17 1530 01/16/17 1551 01/16/17 1600  BP: 132/83 102/84 (!) 133/97 112/83  Pulse: (!) 111 (!) 102 99 98  Resp: 17 (!) 27 18 (!) 28  Temp:      TempSrc:      SpO2: 94% 93% 94% 95%   Constitutional: NAD, calm, somewhat anxious  Eyes: PERRL, lids and conjunctivae normal ENMT: Mucous membranes are moist, without exudate or lesions  Neck: normal, supple, no masses, no thyromegaly Respiratory: clear to auscultation bilaterally, no wheezing, no crackles. Normal respiratory effort  Cardiovascular: Regular rate and rhythm, no murmurs, rubs or gallops. Trace lower extremity L>R edema. 2+ pedal pulses. No carotid bruits.  Abdomen: obese, non tender, No hepatosplenomegaly. Bowel sounds positive.  Musculoskeletal: no clubbing / cyanosis. Moves all extremities Skin: no jaundice, No lesions.  Neurologic: Sensation intact  Strength equal in all extremities Psychiatric:   Alert and  oriented x 3.mildly anxious mood.     Labs on Admission: I have personally reviewed following labs and imaging studies  CBC:  Recent Labs Lab 01/16/17 1228  WBC 10.9*  HGB 15.6*  HCT 45.6  MCV 84.9  PLT 161    Basic Metabolic Panel:  Recent Labs Lab 01/16/17 1228  NA 137  K 4.3  CL 103  CO2 24  GLUCOSE 236*  BUN 13  CREATININE 0.93  CALCIUM 8.9    GFR: CrCl cannot be calculated (Unknown ideal weight.).  Liver Function Tests: No results for input(s): AST, ALT, ALKPHOS, BILITOT, PROT, ALBUMIN in the last 168 hours. No results for input(s): LIPASE, AMYLASE in the last 168 hours. No results for input(s): AMMONIA in the last 168 hours.  Coagulation Profile:  Recent Labs Lab 01/16/17 1228  INR 1.01    Cardiac Enzymes: No results for input(s): CKTOTAL, CKMB, CKMBINDEX, TROPONINI in the last 168 hours.  BNP (last 3 results) No results for input(s): PROBNP in the last 8760 hours.  HbA1C: No results for input(s): HGBA1C in the last 72 hours.  CBG: No results for input(s): GLUCAP in the last 168 hours.  Lipid Profile: No results for input(s): CHOL, HDL, LDLCALC, TRIG, CHOLHDL, LDLDIRECT in the last 72 hours.  Thyroid Function Tests: No results for input(s): TSH, T4TOTAL, FREET4, T3FREE, THYROIDAB in the last 72 hours.  Anemia Panel: No results for input(s): VITAMINB12, FOLATE, FERRITIN, TIBC, IRON, RETICCTPCT in the last 72 hours.  Urine analysis:    Component Value Date/Time   COLORURINE YELLOW 08/09/2015 1640   APPEARANCEUR CLOUDY (A) 08/09/2015 1640   LABSPEC 1.015 08/09/2015 1640   PHURINE 6.5 08/09/2015 1640   GLUCOSEU 500 (A) 08/09/2015 1640   HGBUR LARGE (A) 08/09/2015 1640   BILIRUBINUR NEGATIVE 08/09/2015 1640   KETONESUR NEGATIVE 08/09/2015 1640   PROTEINUR 100 (A) 08/09/2015 1640   UROBILINOGEN 0.2 04/10/2014 1127   NITRITE NEGATIVE 08/09/2015 1640   LEUKOCYTESUR NEGATIVE 08/09/2015 1640    Sepsis  Labs: @LABRCNTIP (procalcitonin:4,lacticidven:4) )No results found for this or any previous visit (from the past 240 hour(s)).   Radiological Exams  on Admission: Dg Chest 2 View  Result Date: 01/16/2017 CLINICAL DATA:  Non radiating left chest pain. EXAM: CHEST  2 VIEW COMPARISON:  05/12/2012 FINDINGS: Normal heart size when accounting for lower mediastinal fat. Negative aortic and hilar contours. There is no edema, consolidation, effusion, or pneumothorax. Pleural based nodular density inferiorly on the right is stable from 2013, likely posttraumatic based on chest CT. Nodular density left apex from EKG pad. IMPRESSION: No acute finding. Electronically Signed   By: Monte Fantasia M.D.   On: 01/16/2017 13:02   Ct Angio Chest Pe W And/or Wo Contrast  Result Date: 01/16/2017 CLINICAL DATA:  Shortness of breath for 2 days. History of hypertension, diabetes, asthma, pneumonia and stroke. EXAM: CT ANGIOGRAPHY CHEST WITH CONTRAST TECHNIQUE: Multidetector CT imaging of the chest was performed using the standard protocol during bolus administration of intravenous contrast. Multiplanar CT image reconstructions and MIPs were obtained to evaluate the vascular anatomy. CONTRAST:  100 cc Isovue 370 COMPARISON:  Chest radiograph January 16, 2017 at 1255 hours and CT chest April 24, 2012 FINDINGS: CARDIOVASCULAR: Adequate contrast opacification of the pulmonary artery's. Main pulmonary artery is not enlarged. No pulmonary arterial filling defects to the level of the segmental branches, respiratory motion limits sensitivity for distal emboli. Heart size is mildly enlarged, no right heart strain. Mild coronary artery calcifications. Small pericardial effusion. Thoracic aorta is normal course and caliber, mild calcific atherosclerosis. MEDIASTINUM/NODES: No lymphadenopathy by CT size criteria. LUNGS/PLEURA: Tracheobronchial tree is patent, no pneumothorax. Bronchial wall thickening. RIGHT pleural thickening without pleural  effusion. LEFT lung base atelectasis. No focal consolidation, pulmonary nodules or masses. Mild centrilobular emphysema. UPPER ABDOMEN: Partially imaged thickened adrenal glands suggests hyperplasia. Hepatic steatosis. MUSCULOSKELETAL: Visualized soft tissues and included osseous structures are nonacute. Complex 3.1 cm RIGHT thyroid nodule. Cystic degenerative change RIGHT humeral head. Old RIGHT rib fractures. Mild degenerative change of thoracic spine. Review of the MIP images confirms the above findings. IMPRESSION: No acute pulmonary embolism. Bronchial wall thickening associated with bronchitis or reactive airway disease without focal consolidation. Mild emphysema. Mild cardiomegaly.  Small pericardial effusion. **An incidental finding of potential clinical significance has been found. Complex 3.1 cm RIGHT thyroid nodule for which follow up thyroid sonogram is recommended on a nonemergent basis. This follows ACR consensus guidelines: Managing Incidental Thyroid Nodules Detected on Imaging: White Paper of the ACR Incidental Thyroid Findings Committee. J Am Coll Radiol 2015; 12:143-150. ** Electronically Signed   By: Elon Alas M.D.   On: 01/16/2017 15:08    EKG: Independently reviewed.  Assessment/Plan Active Problems:   GOUT   OBESITY   Essential hypertension   ALLERGIC RHINITIS, SEASONAL   Asthma   History of stroke   Diabetes mellitus (HCC)   Influenza   Anxiety   Chest pain     Chest pain syndrome cardiac versus pleuritic   HEART score 6 . Troponin 0.32 in the setting of O2 demand and respiratory infection/COPD  , EKG Sinus tachycardia with Premature atrial complexes Right bundle branch block Left posterior fascicular block, r/o anterior infarct. Cards to see. CP not completely releived by nitroglycerin,  CXR unrevealing. CT angio chest neg for PE .  EDP to initiate heparin  Admit to Telemetry Stepdown  Chest pain order set Cycle troponins EKG in am continue ASA, O2 and NTG  as needed Continue preadmission beta blocker and nitrate Statins   GI cocktail Check Lipid panel  Hb A1C Cards consultation requested by EDP . Furhter recommendations as per Cards  Acute respiratory distress without hypoxia, likely secondary to acute COPD exacerbation and Flu + CXR and CTA NAD   WBC 10.9  Tmax 102   VSS O2 normal . Received Solumedrol 125 mg x1 IV and Duoneb with improvement of symptoms. Non toxic appearing . BCX pending  Tamiflu 75 mg bid  Respiratory Viral panel Nebulizers Duoneb q 4 hrs  and Albuterol every 2 hrs prn   Prednisone 40 mg qd x 5 days   Mucinex O2 prn CBC in am Antipyretics prn Droplet precaution   History of stroke, remote, s/p Coumadin x 6 months, none currently No acute neurological abnormalities  Continue ASA daily    Hypertension BP 112/83   Pulse 98   Temp (!) 100.4 F (38 C) (Oral)   Resp (!) 28   SpO2 95%  Controlled Continue home anti-hypertensive medications   Type II Diabetes with neuropathy Current blood sugar level is 236 Lab Results  Component Value Date   HGBA1C 12.2 (H) 08/30/2015  Hgb A1C COntinue Levemir , SSI Heart healthy carb modified diet. Continue Neurontin   Anxiety Continue home meds  History of Gout  Continue Zyloprim  DVT prophylaxis: Heparin per Cards  Code Status:   Full      Family Communication:  Discussed with patient Disposition Plan: Expect patient to be discharged to home after condition improves Consults called:  Cardiology    Admission status  SDU    [place], PA-C [facility]   01/16/2017, 4:35 PM

## 2017-01-16 NOTE — ED Triage Notes (Signed)
Pt reports non-radiating left sided sharp chest pain that started 2 days ago associated with SOB and cough, worse at night.

## 2017-01-16 NOTE — ED Provider Notes (Signed)
[place] DEPT Provider Note   CSN: [id number] Arrival date & time: 01/16/17  1218     History   Chief Complaint Chief Complaint  Patient presents with  . Chest Pain  . flu symptoms  . Fever    HPI Teresa Kerr is a 66 y.o. female.  66 year old female with past medical history including hypertension, type 2 diabetes mellitus, CVA, COPD who presents with chest pain and shortness of breath. 2 days ago, the patient began having sharp, left-sided chest pain that has become progressively more severe and has kept her from sleeping. Pain is worse with breathing. She reports shortness of breath associated with the chest pain as well as a cough productive of yellow phlegm. She has had sweats and chills. She denies any vomiting but has had diarrhea. She was taken off of Coumadin approximately 6 months ago. No sick contacts.   The history is provided by the patient.  Chest Pain      Past Medical History:  Diagnosis Date  . Asthma   . Diabetes mellitus   . Gout   . Hypertension   . Neuromuscular disorder ([place])    right sides weakness arm and leg, steroid injections to right shoulder-Dr. Beane(last 8'16)  . Shortness of breath dyspnea    mild only with "recent cold"  . Stroke [facility]) 2002, 1999   pt has right sided weakness at times, gets ESI when weakness returns  . Umbilical hernia   . Uterine fibroid     Patient Active Problem List   Diagnosis Date Noted  . Pleural effusion, R, ? hemothorax 04/24/2012  . Community acquired pneumonia 04/20/2012  . Hypoxia 04/20/2012  . Shingles 04/20/2012  . Rib pain 04/20/2012  . Hypertension 04/20/2012  . History of stroke 04/20/2012  . Chronic anticoagulation 04/20/2012  . Diabetes mellitus ([place]) 04/20/2012  . FIBROIDS, UTERUS 05/16/2007  . HX, PERSONAL, TOBACCO USE 05/16/2007  . GOUT 05/14/2007  . OBESITY 05/14/2007  . ESSENTIAL HYPERTENSION 05/14/2007  . ALLERGIC RHINITIS, SEASONAL 05/14/2007  . ASTHMA 05/14/2007     Past Surgical History:  Procedure Laterality Date  . ANGIOPLASTY     x 2 times-cerebral arterial stents- [place]  . BRAIN SURGERY     "collapsed vessel" in brain  . CYSTOSCOPY WITH RETROGRADE PYELOGRAM, URETEROSCOPY AND STENT PLACEMENT Left 09/01/2015   Procedure: CYSTOSCOPY WITH LEFT RETROGRADE, LEFT URETEROSCOPY AND STENT PLACEMENT;  Surgeon: Irine Seal, MD;  Location: [facility] ORS;  Service: Urology;  Laterality: Left;  . HAND SURGERY    . HOLMIUM LASER APPLICATION Left 70/10/6376   Procedure: WITH HOLMIUM LASER ;  Surgeon: Irine Seal, MD;  Location: [facility];  Service: Urology;  Laterality: Left;  . TOE SURGERY      OB History    Gravida Para Term Preterm AB Living   3 3 3     3    SAB TAB Ectopic Multiple Live Births                   Home Medications    Prior to Admission medications   Medication Sig Start Date End Date Taking? Authorizing Provider  acetaminophen (TYLENOL) 325 MG tablet Take 325-650 mg by mouth every 6 (six) hours as needed for mild pain, moderate pain, fever or headache.    Historical Provider, MD  allopurinol (ZYLOPRIM) 100 MG tablet Take 100 mg by mouth daily. 01/04/17   Historical Provider, MD  amLODipine (NORVASC) 10 MG tablet Take 10 mg by mouth daily.  Historical Provider, MD  atorvastatin (LIPITOR) 10 MG tablet Take 10 mg by mouth daily at 6 PM.  01/25/15   Historical Provider, MD  cloNIDine (CATAPRES) 0.1 MG tablet Take 0.1 mg by mouth 2 (two) times daily. 08/22/15   Historical Provider, MD  clopidogrel (PLAVIX) 75 MG tablet Take 75 mg by mouth daily.  05/01/12   Historical Provider, MD  colchicine 0.6 MG tablet Take 0.6 mg by mouth See admin instructions. 1 tablet daily. In case of gout attack, take 2 tablets and take another 1 tablet one hour later.    Historical Provider, MD  DEXILANT 30 MG capsule Take 30 mg by mouth daily. 11/26/16   Historical Provider, MD  Fluticasone-Salmeterol (ADVAIR) 250-50 MCG/DOSE AEPB Inhale 2 puffs into the lungs as  directed.     Historical Provider, MD  furosemide (LASIX) 20 MG tablet Take 20 mg by mouth daily.     Historical Provider, MD  gabapentin (NEURONTIN) 300 MG capsule Take 300 mg by mouth 2 (two) times daily.    Historical Provider, MD  ibuprofen (ADVIL,MOTRIN) 200 MG tablet Take 1 tablet (200 mg total) by mouth every 6 (six) hours as needed. 08/09/15   Hoyle Sauer, MD  LEVEMIR FLEXTOUCH 100 UNIT/ML Pen Inject 24 Units into the skin at bedtime.  12/27/14   Historical Provider, MD  lisinopril (PRINIVIL,ZESTRIL) 40 MG tablet Take 40 mg by mouth daily.  01/05/15   Historical Provider, MD  meloxicam (MOBIC) 15 MG tablet Take 15 mg by mouth daily. 01/04/17   Historical Provider, MD  mometasone (NASONEX) 50 MCG/ACT nasal spray Place 2 sprays into the nose daily. 2 sprays in each nostril 06/10/15   Historical Provider, MD  ondansetron (ZOFRAN) 4 MG tablet Take 1 tablet (4 mg total) by mouth every 8 (eight) hours as needed for nausea or vomiting. 08/09/15   Hoyle Sauer, MD  oxyCODONE-acetaminophen (PERCOCET/ROXICET) 5-325 MG tablet Take 1 tablet by mouth every 4 (four) hours as needed for severe pain. 09/01/15   Irine Seal, MD  phenazopyridine (PYRIDIUM) 200 MG tablet Take 1 tablet (200 mg total) by mouth 3 (three) times daily as needed for pain. Can get OTC AZO if cheaper 09/01/15   Irine Seal, MD  [facility] RESPICLICK [phone] BASE) MCG/ACT AEPB Inhale 2 puffs into the lungs every 6 (six) hours as needed (shortness of breath).  10/25/14   Historical Provider, MD  promethazine (PHENERGAN) 25 MG tablet Take 1 tablet (25 mg total) by mouth every 6 (six) hours as needed for refractory nausea / vomiting. 08/09/15   Hoyle Sauer, MD  tiZANidine (ZANAFLEX) 2 MG tablet Take 2 mg by mouth 2 (two) times daily. 01/04/17   Historical Provider, MD  warfarin (COUMADIN) 7.5 MG tablet Take 7.5 mg by mouth daily.    Historical Provider, MD    Family History Family History  Problem Relation Age of Onset  . Cancer Mother      breast/ had mastecomy  . Cancer Father     colon    Social History Social History  Substance Use Topics  . Smoking status: Former Smoker    Packs/day: 1.50    Years: 34.00    Quit date: 09/24/1997  . Smokeless tobacco: Never Used  . Alcohol use No     Allergies   Latex   Review of Systems Review of Systems  Cardiovascular: Positive for chest pain.   All other systems reviewed and are negative except that which was mentioned in HPI  Physical Exam  Updated Vital Signs BP 102/84   Pulse (!) 102   Temp (!) 100.4 F (38 C) (Oral)   Resp (!) 27   SpO2 93%   Physical Exam  Constitutional: She is oriented to person, place, and time. She appears well-developed and well-nourished. No distress.  HENT:  Head: Normocephalic and atraumatic.  Moist mucous membranes  Eyes: Conjunctivae are normal. Pupils are equal, round, and reactive to light.  Neck: Neck supple.  Cardiovascular: Regular rhythm and normal heart sounds.  Tachycardia present.   No murmur heard. Pulmonary/Chest: Effort normal. No respiratory distress. She has wheezes.  Mildly increased WOB with wheezes b/l, diminished b/l  Abdominal: Soft. Bowel sounds are normal. She exhibits no distension. There is no tenderness.  Musculoskeletal: She exhibits edema (L>R).  L leg mildly edematous compared to R, tender to palpation of calf  Neurological: She is alert and oriented to person, place, and time.  Fluent speech  Skin: Skin is warm. She is diaphoretic.  Psychiatric: She has a normal mood and affect. Judgment normal.  Nursing note and vitals reviewed.    ED Treatments / Results  Labs (all labs ordered are listed, but only abnormal results are displayed) Labs Reviewed  BASIC METABOLIC PANEL - Abnormal; Notable for the following:       Result Value   Glucose, Bld 236 (*)    All other components within normal limits  CBC - Abnormal; Notable for the following:    WBC 10.9 (*)    RBC 5.37 (*)    Hemoglobin 15.6  (*)    All other components within normal limits  INFLUENZA PANEL BY PCR (TYPE A & B) - Abnormal; Notable for the following:    Influenza B By PCR POSITIVE (*)    All other components within normal limits  I-STAT TROPOININ, ED - Abnormal; Notable for the following:    Troponin i, poc 0.32 (*)    All other components within normal limits  CULTURE, BLOOD (ROUTINE X 2)  CULTURE, BLOOD (ROUTINE X 2)  PROTIME-INR  BRAIN NATRIURETIC PEPTIDE  I-STAT CG4 LACTIC ACID, ED    EKG  EKG Interpretation  Date/Time:  Wednesday January 16 2017 13:21:38 EDT Ventricular Rate:  111 PR Interval:  134 QRS Duration: 160 QT Interval:  380 QTC Calculation: 517 R Axis:   125 Text Interpretation:  Sinus tachycardia RBBB and LPFB Probable inferior infarct, recent Lateral leads are also involved No acute changes Confirmed by Zulema Pulaski MD, Mckinzy Fuller [phone]) on 01/16/2017 1:35:51 PM       Radiology Dg Chest 2 View  Result Date: 01/16/2017 CLINICAL DATA:  Non radiating left chest pain. EXAM: CHEST  2 VIEW COMPARISON:  05/12/2012 FINDINGS: Normal heart size when accounting for lower mediastinal fat. Negative aortic and hilar contours. There is no edema, consolidation, effusion, or pneumothorax. Pleural based nodular density inferiorly on the right is stable from 2013, likely posttraumatic based on chest CT. Nodular density left apex from EKG pad. IMPRESSION: No acute finding. Electronically Signed   By: Monte Fantasia M.D.   On: 01/16/2017 13:02   Ct Angio Chest Pe W And/or Wo Contrast  Result Date: 01/16/2017 CLINICAL DATA:  Shortness of breath for 2 days. History of hypertension, diabetes, asthma, pneumonia and stroke. EXAM: CT ANGIOGRAPHY CHEST WITH CONTRAST TECHNIQUE: Multidetector CT imaging of the chest was performed using the standard protocol during bolus administration of intravenous contrast. Multiplanar CT image reconstructions and MIPs were obtained to evaluate the vascular anatomy. CONTRAST:  100 cc Isovue  370 COMPARISON:  Chest radiograph January 16, 2017 at 1255 hours and CT chest April 24, 2012 FINDINGS: CARDIOVASCULAR: Adequate contrast opacification of the pulmonary artery's. Main pulmonary artery is not enlarged. No pulmonary arterial filling defects to the level of the segmental branches, respiratory motion limits sensitivity for distal emboli. Heart size is mildly enlarged, no right heart strain. Mild coronary artery calcifications. Small pericardial effusion. Thoracic aorta is normal course and caliber, mild calcific atherosclerosis. MEDIASTINUM/NODES: No lymphadenopathy by CT size criteria. LUNGS/PLEURA: Tracheobronchial tree is patent, no pneumothorax. Bronchial wall thickening. RIGHT pleural thickening without pleural effusion. LEFT lung base atelectasis. No focal consolidation, pulmonary nodules or masses. Mild centrilobular emphysema. UPPER ABDOMEN: Partially imaged thickened adrenal glands suggests hyperplasia. Hepatic steatosis. MUSCULOSKELETAL: Visualized soft tissues and included osseous structures are nonacute. Complex 3.1 cm RIGHT thyroid nodule. Cystic degenerative change RIGHT humeral head. Old RIGHT rib fractures. Mild degenerative change of thoracic spine. Review of the MIP images confirms the above findings. IMPRESSION: No acute pulmonary embolism. Bronchial wall thickening associated with bronchitis or reactive airway disease without focal consolidation. Mild emphysema. Mild cardiomegaly.  Small pericardial effusion. **An incidental finding of potential clinical significance has been found. Complex 3.1 cm RIGHT thyroid nodule for which follow up thyroid sonogram is recommended on a nonemergent basis. This follows ACR consensus guidelines: Managing Incidental Thyroid Nodules Detected on Imaging: White Paper of the ACR Incidental Thyroid Findings Committee. J Am Coll Radiol 2015; 12:143-150. ** Electronically Signed   By: Elon Alas M.D.   On: 01/16/2017 15:08    Procedures .Critical  Care Performed by: Sharlett Iles Authorized by: Sharlett Iles   Critical care provider statement:    Critical care time (minutes):  30   Critical care time was exclusive of:  Separately billable procedures and treating other patients   Critical care was necessary to treat or prevent imminent or life-threatening deterioration of the following conditions:  Cardiac failure (NSTEMI)   Critical care was time spent personally by me on the following activities:  Discussions with consultants, development of treatment plan with patient or surrogate, interpretation of cardiac output measurements, obtaining history from patient or surrogate, ordering and performing treatments and interventions, ordering and review of laboratory studies, ordering and review of radiographic studies, re-evaluation of patient's condition and review of old charts   (including critical care time)  Medications Ordered in ED Medications  oseltamivir (TAMIFLU) capsule 75 mg (not administered)  aspirin chewable tablet 324 mg (not administered)  acetaminophen (TYLENOL) tablet 1,000 mg (1,000 mg Oral Given 01/16/17 1339)  methylPREDNISolone sodium succinate (SOLU-MEDROL) 125 mg/2 mL injection 125 mg (125 mg Intravenous Given 01/16/17 1339)  ipratropium-albuterol (DUONEB) 0.5-2.5 (3) MG/3ML nebulizer solution 3 mL (3 mLs Nebulization Given 01/16/17 1340)  iopamidol (ISOVUE-370) 76 % injection (100 mLs  Contrast Given 01/16/17 1433)  nitroGLYCERIN (NITROGLYN) 2 % ointment 0.5 inch (0.5 inches Topical Given 01/16/17 1548)     Initial Impression / Assessment and Plan / ED Course  I have reviewed the triage vital signs and the nursing notes.  Pertinent labs & imaging results that were available during my care of the patient were reviewed by me and considered in my medical decision making (see chart for details).    Pt p/w 2 days of sharp, pleuritic chest pain associated with shortness of breath and cough. On exam, she  was diaphoretic and tachycardic, wheezes bilaterally but no respiratory distress. Vital signs notable for fever of 102, heart rate 119. Obtained blood cultures and above lab  work. Lactate normal. Gave Tylenol, Solu-Medrol, and DuoNeb as well as aspirin.  Initial troponin elevated at 0.32. WBC 10.9. CXR negative acute. Obtained CTA of chest to rule out PE as patient does report history of blood clots. CTA was negative for PE but did show incidental finding of thyroid nodule which I have discussed with the patient. Fever improved after Tylenol. Influenza is positive therefore initiated Tamiflu because of her comorbidities. She continues to have mild chest pain and therefore gave nitroglycerin. I have contacted cardiology for consultation given her positive troponin but I have held on heparin drip because of her concurrent infectious process and COPD exacerbation.  Discussed admission with [facility], Sharene Butters, appreciate assistance. Pt admitted for further care.  Final Clinical Impressions(s) / ED Diagnoses   Final diagnoses:  NSTEMI (non-ST elevated myocardial infarction) ([place])  Influenza B  COPD exacerbation ([place])    New Prescriptions New Prescriptions   No medications on file     Sharlett Iles, MD 01/16/17 1559

## 2017-01-17 DIAGNOSIS — E118 Type 2 diabetes mellitus with unspecified complications: Secondary | ICD-10-CM | POA: Diagnosis not present

## 2017-01-17 DIAGNOSIS — J9601 Acute respiratory failure with hypoxia: Secondary | ICD-10-CM | POA: Diagnosis not present

## 2017-01-17 DIAGNOSIS — J111 Influenza due to unidentified influenza virus with other respiratory manifestations: Secondary | ICD-10-CM | POA: Diagnosis not present

## 2017-01-17 DIAGNOSIS — R748 Abnormal levels of other serum enzymes: Secondary | ICD-10-CM | POA: Diagnosis not present

## 2017-01-17 DIAGNOSIS — R0789 Other chest pain: Secondary | ICD-10-CM | POA: Diagnosis not present

## 2017-01-17 DIAGNOSIS — J101 Influenza due to other identified influenza virus with other respiratory manifestations: Secondary | ICD-10-CM | POA: Diagnosis not present

## 2017-01-17 DIAGNOSIS — J441 Chronic obstructive pulmonary disease with (acute) exacerbation: Secondary | ICD-10-CM | POA: Diagnosis not present

## 2017-01-17 LAB — RESPIRATORY PANEL BY PCR
ADENOVIRUS-RVPPCR: NOT DETECTED
Bordetella pertussis: NOT DETECTED
CORONAVIRUS 229E-RVPPCR: NOT DETECTED
CORONAVIRUS NL63-RVPPCR: NOT DETECTED
Chlamydophila pneumoniae: NOT DETECTED
Coronavirus HKU1: NOT DETECTED
Coronavirus OC43: NOT DETECTED
INFLUENZA B-RVPPCR: DETECTED — AB
Influenza A: NOT DETECTED
MYCOPLASMA PNEUMONIAE-RVPPCR: NOT DETECTED
Metapneumovirus: NOT DETECTED
PARAINFLUENZA VIRUS 1-RVPPCR: NOT DETECTED
Parainfluenza Virus 2: NOT DETECTED
Parainfluenza Virus 3: NOT DETECTED
Parainfluenza Virus 4: NOT DETECTED
Respiratory Syncytial Virus: NOT DETECTED
Rhinovirus / Enterovirus: NOT DETECTED

## 2017-01-17 LAB — BASIC METABOLIC PANEL
ANION GAP: 13 (ref 5–15)
BUN: 20 mg/dL (ref 6–20)
CO2: 26 mmol/L (ref 22–32)
Calcium: 9.1 mg/dL (ref 8.9–10.3)
Chloride: 97 mmol/L — ABNORMAL LOW (ref 101–111)
Creatinine, Ser: 1.11 mg/dL — ABNORMAL HIGH (ref 0.44–1.00)
GFR calc non Af Amer: 52 mL/min — ABNORMAL LOW (ref >60)
GFR, EST AFRICAN AMERICAN: 60 mL/min — AB (ref >60)
Glucose, Bld: 406 mg/dL — ABNORMAL HIGH (ref 65–99)
Potassium: 4.3 mmol/L (ref 3.5–5.1)
Sodium: 136 mmol/L (ref 135–145)

## 2017-01-17 LAB — CBC
HEMATOCRIT: 46.2 % — AB (ref 36.0–46.0)
Hemoglobin: 15.4 g/dL — ABNORMAL HIGH (ref 12.0–15.0)
MCH: 28.5 pg (ref 26.0–34.0)
MCHC: 33.3 g/dL (ref 30.0–36.0)
MCV: 85.6 fL (ref 78.0–100.0)
PLATELETS: 293 10*3/uL (ref 150–400)
RBC: 5.4 MIL/uL — AB (ref 3.87–5.11)
RDW: 13 % (ref 11.5–15.5)
WBC: 10.5 10*3/uL (ref 4.0–10.5)

## 2017-01-17 LAB — GLUCOSE, CAPILLARY
GLUCOSE-CAPILLARY: 366 mg/dL — AB (ref 65–99)
GLUCOSE-CAPILLARY: 344 mg/dL — AB (ref 65–99)
GLUCOSE-CAPILLARY: 325 mg/dL — AB (ref 65–99)
Glucose-Capillary: 478 mg/dL — ABNORMAL HIGH (ref 65–99)
Glucose-Capillary: 334 mg/dL — ABNORMAL HIGH (ref 65–99)

## 2017-01-17 LAB — TROPONIN I: Troponin I: 0.22 ng/mL (ref <0.03)

## 2017-01-17 LAB — PROCALCITONIN: Procalcitonin: 0.18 ng/mL

## 2017-01-17 MED ORDER — INSULIN ASPART 100 UNIT/ML ~~LOC~~ SOLN
10.0000 [IU] | Freq: Once | SUBCUTANEOUS | Status: AC
  Administered 2017-01-17: 10 [IU] via SUBCUTANEOUS

## 2017-01-17 MED ORDER — AZITHROMYCIN 250 MG PO TABS
500.0000 mg | ORAL_TABLET | Freq: Every day | ORAL | Status: AC
  Administered 2017-01-17: 500 mg via ORAL
  Filled 2017-01-17: qty 2

## 2017-01-17 MED ORDER — AZITHROMYCIN 250 MG PO TABS
250.0000 mg | ORAL_TABLET | Freq: Every day | ORAL | Status: DC
  Administered 2017-01-18: 250 mg via ORAL
  Filled 2017-01-17: qty 1

## 2017-01-17 MED ORDER — ASPIRIN EC 81 MG PO TBEC
81.0000 mg | DELAYED_RELEASE_TABLET | Freq: Every day | ORAL | Status: DC
  Administered 2017-01-17 – 2017-01-18 (×2): 81 mg via ORAL
  Filled 2017-01-17 (×2): qty 1

## 2017-01-17 MED ORDER — INSULIN ASPART 100 UNIT/ML ~~LOC~~ SOLN
0.0000 [IU] | Freq: Three times a day (TID) | SUBCUTANEOUS | Status: DC
  Administered 2017-01-17 (×2): 11 [IU] via SUBCUTANEOUS
  Administered 2017-01-17 – 2017-01-18 (×2): 15 [IU] via SUBCUTANEOUS
  Administered 2017-01-18: 5 [IU] via SUBCUTANEOUS
  Administered 2017-01-18: 8 [IU] via SUBCUTANEOUS

## 2017-01-17 MED ORDER — INSULIN DETEMIR 100 UNIT/ML ~~LOC~~ SOLN
50.0000 [IU] | Freq: Every day | SUBCUTANEOUS | Status: DC
  Administered 2017-01-17: 50 [IU] via SUBCUTANEOUS
  Filled 2017-01-17 (×2): qty 0.5

## 2017-01-17 MED ORDER — CARVEDILOL 3.125 MG PO TABS
3.1250 mg | ORAL_TABLET | Freq: Two times a day (BID) | ORAL | Status: DC
  Administered 2017-01-17 – 2017-01-18 (×4): 3.125 mg via ORAL
  Filled 2017-01-17 (×4): qty 1

## 2017-01-17 MED ORDER — LOSARTAN POTASSIUM 25 MG PO TABS
25.0000 mg | ORAL_TABLET | Freq: Every day | ORAL | Status: DC
  Administered 2017-01-17 – 2017-01-18 (×2): 25 mg via ORAL
  Filled 2017-01-17 (×2): qty 1

## 2017-01-17 MED ORDER — ENOXAPARIN SODIUM 40 MG/0.4ML ~~LOC~~ SOLN
40.0000 mg | SUBCUTANEOUS | Status: DC
  Administered 2017-01-17: 40 mg via SUBCUTANEOUS
  Filled 2017-01-17: qty 0.4

## 2017-01-17 MED ORDER — INSULIN ASPART 100 UNIT/ML ~~LOC~~ SOLN
5.0000 [IU] | Freq: Three times a day (TID) | SUBCUTANEOUS | Status: DC
  Administered 2017-01-17 – 2017-01-18 (×5): 5 [IU] via SUBCUTANEOUS

## 2017-01-17 MED ORDER — INSULIN ASPART 100 UNIT/ML ~~LOC~~ SOLN
0.0000 [IU] | Freq: Every day | SUBCUTANEOUS | Status: DC
  Administered 2017-01-17: 4 [IU] via SUBCUTANEOUS

## 2017-01-17 MED ORDER — LIVING WELL WITH DIABETES BOOK
Freq: Once | Status: AC
  Administered 2017-01-17: 1
  Filled 2017-01-17: qty 1

## 2017-01-17 MED ORDER — PREDNISONE 20 MG PO TABS
40.0000 mg | ORAL_TABLET | Freq: Every day | ORAL | Status: DC
  Administered 2017-01-17 – 2017-01-18 (×2): 40 mg via ORAL
  Filled 2017-01-17 (×2): qty 2

## 2017-01-17 NOTE — Progress Notes (Signed)
Progress Note  Patient Name: Teresa Kerr Date of Encounter: 01/17/2017  Primary Cardiologist: J. Daniyla Pfahler, MD   Subjective   She has continued to have left lateral chest/left axillary discomfort that is worse with coughing and deep breathing.  Trop 0.25  0.22.  She has eaten breakfast this am.  Inpatient Medications    Scheduled Meds: . allopurinol  100 mg Oral Daily  . aspirin EC  81 mg Oral Daily  . atorvastatin  10 mg Oral q1800  . [START ON 01/18/2017] azithromycin  250 mg Oral Daily  . carvedilol  3.125 mg Oral BID WC  . clopidogrel  75 mg Oral Daily  . furosemide  20 mg Oral Daily  . gabapentin  300 mg Oral BID  . insulin aspart  0-15 Units Subcutaneous TID WC  . insulin aspart  0-5 Units Subcutaneous QHS  . insulin detemir  40 Units Subcutaneous QHS  . meloxicam  15 mg Oral Daily  . oseltamivir  75 mg Oral BID  . pantoprazole  40 mg Oral Daily  . predniSONE  40 mg Oral Q breakfast   Continuous Infusions:  PRN Meds: acetaminophen, ipratropium-albuterol   Vital Signs    Vitals:   01/16/17 1954 01/17/17 0300 01/17/17 0605 01/17/17 0901  BP: 130/80 (!) 150/82 137/89 134/89  Pulse: 93 84 86 81  Resp: 20 (!) 22 20   Temp: 98.3 F (36.8 C) 97.6 F (36.4 C) 97.5 F (36.4 C)   TempSrc: Oral Oral Oral   SpO2: 96% 95% 98%   Weight: 235 lb 6.4 oz (106.8 kg)  233 lb 1.6 oz (105.7 kg)   Height: 5\' 5"  (1.651 m)       Intake/Output Summary (Last 24 hours) at 01/17/17 0908 Last data filed at 01/17/17 0500  Gross per 24 hour  Intake              720 ml  Output                0 ml  Net              720 ml   Filed Weights   01/16/17 1954 01/17/17 0605  Weight: 235 lb 6.4 oz (106.8 kg) 233 lb 1.6 oz (105.7 kg)    Physical Exam   GEN: Well nourished, well developed, in no acute distress.  HEENT: Grossly normal except poor dentition. Neck: Supple, no JVD, carotid bruits, or masses. Cardiac: RRR, no murmurs, rubs, or gallops. No clubbing, cyanosis,  edema.  Radials/DP/PT 2+ and equal bilaterally.  Respiratory:  Respirations regular and unlabored, occas exp wheeze. GI: Soft, nontender, nondistended, BS + x 4. MS: no deformity or atrophy. Skin: warm and dry, no rash. Neuro:  Strength and sensation are intact. Psych: AAOx3.  Normal affect.  Labs    Chemistry Recent Labs Lab 01/16/17 1228 01/17/17 0524  NA 137 136  K 4.3 4.3  CL 103 97*  CO2 24 26  GLUCOSE 236* 406*  BUN 13 20  CREATININE 0.93 1.11*  CALCIUM 8.9 9.1  GFRNONAA >60 52*  GFRAA >60 60*  ANIONGAP 10 13     Hematology Recent Labs Lab 01/16/17 1228 01/17/17 0036  WBC 10.9* 10.5  RBC 5.37* 5.40*  HGB 15.6* 15.4*  HCT 45.6 46.2*  MCV 84.9 85.6  MCH 29.1 28.5  MCHC 34.2 33.3  RDW 12.7 13.0  PLT 283 293    Cardiac Enzymes Recent Labs Lab 01/16/17 1845 01/16/17 2326  TROPONINI 0.25* 0.22*  Recent Labs Lab 01/16/17 1239  TROPIPOC 0.32*     BNP Recent Labs Lab 01/16/17 1228  BNP 118.4*    Radiology    Dg Chest 2 View  Result Date: 01/16/2017 CLINICAL DATA:  Non radiating left chest pain. EXAM: CHEST  2 VIEW COMPARISON:  05/12/2012 FINDINGS: Normal heart size when accounting for lower mediastinal fat. Negative aortic and hilar contours. There is no edema, consolidation, effusion, or pneumothorax. Pleural based nodular density inferiorly on the right is stable from 2013, likely posttraumatic based on chest CT. Nodular density left apex from EKG pad. IMPRESSION: No acute finding. Electronically Signed   By: Monte Fantasia M.D.   On: 01/16/2017 13:02   Ct Angio Chest Pe W And/or Wo Contrast  Result Date: 01/16/2017 CLINICAL DATA:  Shortness of breath for 2 days. History of hypertension, diabetes, asthma, pneumonia and stroke. EXAM: CT ANGIOGRAPHY CHEST WITH CONTRAST TECHNIQUE: Multidetector CT imaging of the chest was performed using the standard protocol during bolus administration of intravenous contrast. Multiplanar CT image reconstructions  and MIPs were obtained to evaluate the vascular anatomy. CONTRAST:  100 cc Isovue 370 COMPARISON:  Chest radiograph January 16, 2017 at 1255 hours and CT chest April 24, 2012 FINDINGS: CARDIOVASCULAR: Adequate contrast opacification of the pulmonary artery's. Main pulmonary artery is not enlarged. No pulmonary arterial filling defects to the level of the segmental branches, respiratory motion limits sensitivity for distal emboli. Heart size is mildly enlarged, no right heart strain. Mild coronary artery calcifications. Small pericardial effusion. Thoracic aorta is normal course and caliber, mild calcific atherosclerosis. MEDIASTINUM/NODES: No lymphadenopathy by CT size criteria. LUNGS/PLEURA: Tracheobronchial tree is patent, no pneumothorax. Bronchial wall thickening. RIGHT pleural thickening without pleural effusion. LEFT lung base atelectasis. No focal consolidation, pulmonary nodules or masses. Mild centrilobular emphysema. UPPER ABDOMEN: Partially imaged thickened adrenal glands suggests hyperplasia. Hepatic steatosis. MUSCULOSKELETAL: Visualized soft tissues and included osseous structures are nonacute. Complex 3.1 cm RIGHT thyroid nodule. Cystic degenerative change RIGHT humeral head. Old RIGHT rib fractures. Mild degenerative change of thoracic spine. Review of the MIP images confirms the above findings. IMPRESSION: No acute pulmonary embolism. Bronchial wall thickening associated with bronchitis or reactive airway disease without focal consolidation. Mild emphysema. Mild cardiomegaly.  Small pericardial effusion. **An incidental finding of potential clinical significance has been found. Complex 3.1 cm RIGHT thyroid nodule for which follow up thyroid sonogram is recommended on a nonemergent basis. This follows ACR consensus guidelines: Managing Incidental Thyroid Nodules Detected on Imaging: White Paper of the ACR Incidental Thyroid Findings Committee. J Am Coll Radiol 2015; 12:143-150. ** Electronically Signed    By: Elon Alas M.D.   On: 01/16/2017 15:08    Telemetry    RSR - Personally Reviewed  Cardiac Studies   Echo pending  Patient Profile     66 y.o. female w/ a h/o asthma, DM, HTN, obesity, and stroke who was admitted 4/25 with fever, cough, and pleuritic chest/left axillary pain, Flu, and elevated troponins.  CTA neg for PE but notable for coronary Ca2+.  Assessment & Plan    1.  Pleuritic chest and axillary pain/pericardial effusion/elevated troponin:  Pt presented 4/25 with pleuritic left chest and axillary pain - worse with coughing/deep breathing and was dx with flu.  CTA neg for PE.  Mild coronary Ca2+ noted.  Pericardial effusion also noted. Troponins mildly elevated @ .025  0.22.  ECG w/ chronic RBBB.  Doubt ACS.  Check echo to reassess effusion and LV fxn.  ? Pericarditis.  She  is currently on mobic and prednisone.  Provided that LV fxn nl, can likely defer ischemic eval to outpt setting, once she fully recovers from flu.  2.  Flu and acute respiratory failure:  Per IM.  3.  DM II:  Last A1c on record was 12.2 in 08/2015.  Sugars currently elevated  CBG 4787 @ 1:56 am, 366 @ 7:03a.  Per IM.  4.  Essential HTN:  BP trending > 130.  Cont  blocker.  Add ARB in setting of DM.  5.  Lipids:  Last LDL on record was 57 in 09/2009.  f/u lipids/lft's.  Cont statin therapy in setting of dm and coronary Ca2+.  6.  Morbid Obesity:  Would benefit from diabetes management/dietary consult.  7.  R thyroid Nodule:  Incidental finding on CTA of chest: Complex 3.1 cm RIGHT thyroid nodule.  Will need tft's and f/u thyroid u/s.    Signed, Murray Hodgkins, NP  01/17/2017, 9:08 AM    History and all data above reviewed.  Patient examined.  I agree with the findings as above.  Pain in chest with coughing The patient exam reveals COR:RRR  ,  Lungs: Clear  ,  Abd: Positive bowel sounds, no rebound no guarding, Ext No edema  .  All available labs, radiology testing, previous records reviewed.  Agree with documented assessment and plan.   Echo today to evaluate small effusion and EF.  Unlikely to be an acute ischemic event.  She could have some pericardial inflammation although she is not behaving like somebody with pericarditis.  Repeat an EKG this morning.   Jeneen Rinks Linnae Rasool  10:15 AM  01/17/2017

## 2017-01-17 NOTE — Progress Notes (Addendum)
Inpatient Diabetes Program Recommendations  AACE/ADA: New Consensus Statement on Inpatient Glycemic Control (2015)  Target Ranges:  Prepandial:   less than 140 mg/dL      Peak postprandial:   less than 180 mg/dL (1-2 hours)      Critically ill patients:  140 - 180 mg/dL   Results for SHREEYA, RECENDIZ (MRN [id number]) as of 01/17/2017 10:21  Ref. Range 01/16/2017 22:30 01/16/2017 23:47 01/17/2017 01:56 01/17/2017 07:03  Glucose-Capillary Latest Ref Range: 65 - 99 mg/dL 594 (HH) >600 (HH) 478 (H) 366 (H)   Review of Glycemic Control  Diabetes history: DM2 Outpatient Diabetes medications: Levemir 40 units QHS Current orders for Inpatient glycemic control: Levemir 40 units QHS, Novolog 0-15 units TID with meals, Novolog 0-5 units QHS  Inpatient Diabetes Program Recommendations: Insulin-Basal: Please consider increasing Levemir to 50 units QHS. Correction (SSI): Please consider increasing Novolog correction to resistant scale (0-20 units). Insulin - Meal Coverage: Please consider ordering Novolog 5 units TID with meals for meal coverage (in addition to correction scale) if patient eats at least 50% of meals. HgbA1C: Per patient, A1C was 14% at last office visit 1-2 weeks ago and Levemir dose was increased and patient was started on new oral DM medication but she has not started taking it yet. However, PCP office reports that patient is NOT prescribed any oral DM medications. Outpatient Follow Up:  At time of discharge, would recommend MD consider adjusting DM medication(s) and have patient follow up soon with PCP at the [facility]. Will continue to follow along and make additional recommendations if needed.  Note: Patient received one time dose of Solumedrol 125 mg at 13:39 on 01/16/17 and ordered Prednisone 40 mg QAM which is going to contribute to hyperglycemia. Spoke with patient about diabetes and home regimen for diabetes control. Patient reports that she is followed by PCP at [facility]  for diabetes management. Patient reports that she went to the clinic 1-2 weeks ago and her A1C was 14%. She reports that her Levemir dose was increased from 30 to 40 units QHS and she was told to stop taking Metformin and start a new oral DM medication which she does not know the name of. Patient admits that she has not started taking the new oral DM medication yet.  Patient reports that she has been taking Levemir 40 units QHS since she last seen her PCP 1-2 weeks ago.  Patient states that she is not checking her glucose like she should but reports that when she does check her glucose it is usually in the upper 200's mg/dl or greater.  Discussed glucose and A1C goals. Discussed importance of checking CBGs and maintaining good CBG control to prevent long-term and short-term complications. Explained how hyperglycemia leads to damage within blood vessels which lead to the common complications seen with uncontrolled diabetes. Stressed to the patient the importance of improving glycemic control to prevent further complications from uncontrolled diabetes. Discussed impact of nutrition, exercise, stress, sickness, and medications on diabetes control. Encouraged patient to check her glucose as prescribed and to keep a log book of glucose readings and insulin taken which she will need to take to doctor appointments. Explained how the doctor she follows up with can use glucose trends to continue to make insulin adjustments if needed. Informed patient that Living Well with Diabetes book has been ordered and she needs to read the entire book when received. Stressed importance of taking medication as prescribed and getting DM under control.  Patient  verbalized understanding of information discussed and she states that she has no further questions at this time related to diabetes. [facility] to verify what patient is prescribed for DM and was informed that the only medication she is prescribed is Levemir which was  increased from 35 to 45 units QHS (patient had informed the doctor on 01/04/17 that she was prescribed 35 units of Levemir; therefore dose was increased to 45 units at that time). Inquired about any oral DM medications prescribed and was informed that patient is NOT prescribed any oral DM medications at all. [place] to see what patient is prescribed for DM and was informed that the only DM medication patient is prescribed is Levemir 60 units daily. The Levemir dose that patient reports is different than what she is prescribed by the [facility] and what the pharmacy shows that she is prescribed. At time of discharge, would recommend MD consider adjusting DM medication(s) and have patient follow up soon with PCP at the [facility].  Will continue to follow along and make additional recommendations if needed.  Thanks, Barnie Alderman, RN, MSN, CDE Diabetes Coordinator Inpatient Diabetes Program [phone] (Team Pager)

## 2017-01-17 NOTE — Progress Notes (Addendum)
Patient ID: Teresa Kerr, female   DOB: 1950-12-24, 66 y.o.   MRN: [id number]                                                                PROGRESS NOTE                                                                                                                                                                                                             Patient Demographics:    Teresa Kerr, is a 66 y.o. female, DOB - 07-03-1951, JJK:[id number]  Admit date - 01/16/2017   Admitting Physician Waldemar Dickens, MD  Outpatient Primary MD for the patient is Javier Docker, MD  LOS - 1  Outpatient Specialists:    Chief Complaint  Patient presents with  . Chest Pain  . flu symptoms  . Fever       Brief Narrative  66 y.o. female with medical history significant for remote stroke not on coumadin over the last 6 months, recurrent, 1st in 1999, the other one in 2002, COPD/ asthma, diabetes, hypertensionpresenting with left-sided pleuritic chest pain, and shortness of breath, with symptoms beginning 2 days ago, progressively worse, keeping her from sleeping. The pain was worse with breathing. She denies any palpitations. She did report productive yellow phlegm. She also reported sweats and chills, with fevers up to 102 currently 100.4. She is not aware of sick contacts. No recent long distance trips. She is compliant with her mediter than right chronic lower extremity swelling, but denies any calf pain at this time. Denies nausea, heartburn or change in bowel habits. Denies abdominal pain. Appetite is normal. Denies any dysurications.She reports left greaa. Denies abnormal skin rashes Denies any bleeding issues such as epistaxis, hematemesis, hematuria or hematochezia. She does not smoke, drink alcohol or partakes recreational drug use.  ED Course:  BP 112/83   Pulse 98   Temp (!) 100.4 F (38 C) (Oral)   Resp (!) 28   SpO2 95%   CMET in CBC within normal limits. Influenza by PCR  is positive white count 10.9 hemoglobin 15.6  troponin 0.32 EKGSinus tachycardia RBBB and LPFB Probable inferior infarct, recent Lateral leads are also involved No acute changes. QTC 517 CT angiography chest, and chest x-ray negative for acute disease,  or PE.  BNP pending lactic acid pending received Solu-Medrol 125 mgx1, Duoneb x1  With improvement of dyspnea SHe received NTG and ASA with some improvement of her CP Tamiflu initiated    Subjective:    Teresa Kerr today has slight cp over the left chest,  Worse with cough,  Tender to palpation.  Sharp.  Slight dyspnea, and wheezing.  , No headache, No abdominal pain - No Nausea, No new weakness tingling or numbness, .    Assessment  & Plan :    Active Problems:   GOUT   OBESITY   Essential hypertension   ALLERGIC RHINITIS, SEASONAL   Asthma   History of stroke   Diabetes mellitus (HCC)   Influenza   Anxiety   Chest pain   Diabetes mellitus with complication (HCC)   COPD exacerbation (HCC)   Influenza B   Thyroid nodule   Acute respiratory failure with hypoxia (HCC)     Chest pain syndrome cardiac versus pleuritic   HEART score 6 . Troponin 0.32 in the setting of O2 demand and respiratory infection/COPD  , EKG Sinus tachycardia with Premature atrial complexes Right bundle branch block Left posterior fascicular block, r/o anterior infarct. CXR unrevealing. CT angio chest neg for PE .  Cont aspirin,  plavix, lipitor,  Start carvedilol 3.125mg  po bid Awaiting echo Appreciate cardiology input.   Pericardial effusion (small) Defer to cardiology  Acute respiratory distress without hypoxia, likely secondary to acute COPD exacerbation and Flu + CXR and CTA NAD   WBC 10.9  Tmax 102   VSS O2 normal . Received Solumedrol 125 mg x1 IV and Duoneb with improvement of symptoms. Non toxic appearing . BCX pending  Tamiflu 75 mg bid  Respiratory Viral panel Nebulizers Duoneb q 4 hrs  and Albuterol every 2 hrs prn    Prednisone 40 mg qd x 5 days   Add Zpak Mucinex O2 prn Droplet precaution   History of stroke, remote, s/p Coumadin x 6 months, none currently No acute neurological abnormalities  Continue plavix   Dm2: blood sugar uncontrolled this am 350+ Pt is on steroids and likely sugar will rise higher Needs very close monitoring. Pt is at high risk for readmission due to hyperglycemia without close monitoring.  We increased her long acting insulin by 10 units and added meal time coverage today, Pt needs the prednisone for her Copd exacerbation  If improving then discharge likely tomorrow.    Hypertension BP 112/83   Pulse 98   Temp (!) 100.4 F (38 C) (Oral)   Resp (!) 28   SpO2 95%  Controlled Continue home anti-hypertensive medications   Type II Diabetes with neuropathy Current blood sugar level is 236 Recent Labs       Lab Results  Component Value Date   HGBA1C 12.2 (H) 08/30/2015    Hgb A1C COntinue Levemir , SSI Heart healthy carb modified diet. Continue Neurontin   Anxiety Continue home meds  History of Gout  Continue Zyloprim  DVT prophylaxis:  Lovenox Code Status:   Full      Family Communication:  Discussed with patient Disposition Plan: possibly tomorrow.  Consults called:  Cardiology    Admission status  SDU       Lab Results  Component Value Date   PLT 293 01/17/2017      Anti-infectives    Start     Dose/Rate Route Frequency Ordered Stop   01/17/17 0800  oseltamivir (TAMIFLU) capsule 75 mg  75 mg Oral 2 times daily 01/16/17 1633 01/21/17 1959   01/16/17 1600  oseltamivir (TAMIFLU) capsule 75 mg     75 mg Oral  Once 01/16/17 1550 01/16/17 1600        Objective:   Vitals:   01/16/17 1900 01/16/17 1954 01/17/17 0300 01/17/17 0605  BP: (!) 145/95 130/80 (!) 150/82 137/89  Pulse: 94 93 84 86  Resp: 19 20 (!) 22 20  Temp:  98.3 F (36.8 C) 97.6 F (36.4 C) 97.5 F (36.4 C)  TempSrc:  Oral Oral Oral  SpO2: 93% 96% 95% 98%    Weight:  106.8 kg (235 lb 6.4 oz)  105.7 kg (233 lb 1.6 oz)  Height:  5\' 5"  (1.651 m)      Wt Readings from Last 3 Encounters:  01/17/17 105.7 kg (233 lb 1.6 oz)  09/01/15 117.1 kg (258 lb 4 oz)  08/30/15 117.1 kg (258 lb 4 oz)     Intake/Output Summary (Last 24 hours) at 01/17/17 0651 Last data filed at 01/17/17 0500  Gross per 24 hour  Intake              720 ml  Output                0 ml  Net              720 ml     Physical Exam  Awake Alert, Oriented X 3, No new F.N deficits, Normal affect [place].AT,PERRAL Supple Neck,No JVD, No cervical lymphadenopathy appriciated.  Symmetrical Chest wall movement, Good air movement bilaterally, exp wheeze,  No crackles RRR,No Gallops,Rubs or new Murmurs, No Parasternal Heave +ve B.Sounds, Abd Soft, No tenderness, No organomegaly appriciated, No rebound - guarding or rigidity. No Cyanosis, Clubbing or edema, No new Rash or bruise      Data Review:    CBC  Recent Labs Lab 01/16/17 1228 01/17/17 0036  WBC 10.9* 10.5  HGB 15.6* 15.4*  HCT 45.6 46.2*  PLT 283 293  MCV 84.9 85.6  MCH 29.1 28.5  MCHC 34.2 33.3  RDW 12.7 13.0    Chemistries   Recent Labs Lab 01/16/17 1228 01/17/17 0524  NA 137 136  K 4.3 4.3  CL 103 97*  CO2 24 26  GLUCOSE 236* 406*  BUN 13 20  CREATININE 0.93 1.11*  CALCIUM 8.9 9.1   ------------------------------------------------------------------------------------------------------------------ No results for input(s): CHOL, HDL, LDLCALC, TRIG, CHOLHDL, LDLDIRECT in the last 72 hours.  Lab Results  Component Value Date   HGBA1C 12.2 (H) 08/30/2015   ------------------------------------------------------------------------------------------------------------------ No results for input(s): TSH, T4TOTAL, T3FREE, THYROIDAB in the last 72 hours.  Invalid input(s): FREET3 ------------------------------------------------------------------------------------------------------------------ No results  for input(s): VITAMINB12, FOLATE, FERRITIN, TIBC, IRON, RETICCTPCT in the last 72 hours.  Coagulation profile  Recent Labs Lab 01/16/17 1228  INR 1.01    No results for input(s): DDIMER in the last 72 hours.  Cardiac Enzymes  Recent Labs Lab 01/16/17 1845 01/16/17 2326  TROPONINI 0.25* 0.22*   ------------------------------------------------------------------------------------------------------------------    Component Value Date/Time   BNP 118.4 (H) 01/16/2017 1228    Inpatient Medications  Scheduled Meds: . allopurinol  100 mg Oral Daily  . atorvastatin  10 mg Oral q1800  . clopidogrel  75 mg Oral Daily  . furosemide  20 mg Oral Daily  . gabapentin  300 mg Oral BID  . insulin detemir  40 Units Subcutaneous QHS  . meloxicam  15 mg Oral Daily  . oseltamivir  75  mg Oral BID  . pantoprazole  40 mg Oral Daily   Continuous Infusions: PRN Meds:.acetaminophen, ipratropium-albuterol  Micro Results No results found for this or any previous visit (from the past 240 hour(s)).  Radiology Reports Dg Chest 2 View  Result Date: 01/16/2017 CLINICAL DATA:  Non radiating left chest pain. EXAM: CHEST  2 VIEW COMPARISON:  05/12/2012 FINDINGS: Normal heart size when accounting for lower mediastinal fat. Negative aortic and hilar contours. There is no edema, consolidation, effusion, or pneumothorax. Pleural based nodular density inferiorly on the right is stable from 2013, likely posttraumatic based on chest CT. Nodular density left apex from EKG pad. IMPRESSION: No acute finding. Electronically Signed   By: Monte Fantasia M.D.   On: 01/16/2017 13:02   Ct Angio Chest Pe W And/or Wo Contrast  Result Date: 01/16/2017 CLINICAL DATA:  Shortness of breath for 2 days. History of hypertension, diabetes, asthma, pneumonia and stroke. EXAM: CT ANGIOGRAPHY CHEST WITH CONTRAST TECHNIQUE: Multidetector CT imaging of the chest was performed using the standard protocol during bolus administration  of intravenous contrast. Multiplanar CT image reconstructions and MIPs were obtained to evaluate the vascular anatomy. CONTRAST:  100 cc Isovue 370 COMPARISON:  Chest radiograph January 16, 2017 at 1255 hours and CT chest April 24, 2012 FINDINGS: CARDIOVASCULAR: Adequate contrast opacification of the pulmonary artery's. Main pulmonary artery is not enlarged. No pulmonary arterial filling defects to the level of the segmental branches, respiratory motion limits sensitivity for distal emboli. Heart size is mildly enlarged, no right heart strain. Mild coronary artery calcifications. Small pericardial effusion. Thoracic aorta is normal course and caliber, mild calcific atherosclerosis. MEDIASTINUM/NODES: No lymphadenopathy by CT size criteria. LUNGS/PLEURA: Tracheobronchial tree is patent, no pneumothorax. Bronchial wall thickening. RIGHT pleural thickening without pleural effusion. LEFT lung base atelectasis. No focal consolidation, pulmonary nodules or masses. Mild centrilobular emphysema. UPPER ABDOMEN: Partially imaged thickened adrenal glands suggests hyperplasia. Hepatic steatosis. MUSCULOSKELETAL: Visualized soft tissues and included osseous structures are nonacute. Complex 3.1 cm RIGHT thyroid nodule. Cystic degenerative change RIGHT humeral head. Old RIGHT rib fractures. Mild degenerative change of thoracic spine. Review of the MIP images confirms the above findings. IMPRESSION: No acute pulmonary embolism. Bronchial wall thickening associated with bronchitis or reactive airway disease without focal consolidation. Mild emphysema. Mild cardiomegaly.  Small pericardial effusion. **An incidental finding of potential clinical significance has been found. Complex 3.1 cm RIGHT thyroid nodule for which follow up thyroid sonogram is recommended on a nonemergent basis. This follows ACR consensus guidelines: Managing Incidental Thyroid Nodules Detected on Imaging: White Paper of the ACR Incidental Thyroid Findings  Committee. J Am Coll Radiol 2015; 12:143-150. ** Electronically Signed   By: Elon Alas M.D.   On: 01/16/2017 15:08    Time Spent in minutes  30   Jani Gravel M.D on 01/17/2017 at 6:51 AM  Between 7am to 7pm - Pager - [phone]  After 7pm go to [email or web address] - password [facility]  [facility] -  Office  [phone]

## 2017-01-17 NOTE — Plan of Care (Signed)
Problem: Education: Goal: Knowledge of [facility] Education information/materials will improve Outcome: Progressing Educated patient and son about the importance of wearing their masks while in the room, patient tested positive for the flu.

## 2017-01-18 ENCOUNTER — Inpatient Hospital Stay (HOSPITAL_COMMUNITY): Payer: Medicare Other

## 2017-01-18 DIAGNOSIS — J101 Influenza due to other identified influenza virus with other respiratory manifestations: Secondary | ICD-10-CM | POA: Diagnosis not present

## 2017-01-18 DIAGNOSIS — J111 Influenza due to unidentified influenza virus with other respiratory manifestations: Secondary | ICD-10-CM | POA: Diagnosis not present

## 2017-01-18 DIAGNOSIS — J441 Chronic obstructive pulmonary disease with (acute) exacerbation: Secondary | ICD-10-CM | POA: Diagnosis not present

## 2017-01-18 DIAGNOSIS — R0789 Other chest pain: Secondary | ICD-10-CM | POA: Diagnosis not present

## 2017-01-18 DIAGNOSIS — E118 Type 2 diabetes mellitus with unspecified complications: Secondary | ICD-10-CM | POA: Diagnosis not present

## 2017-01-18 DIAGNOSIS — R079 Chest pain, unspecified: Secondary | ICD-10-CM | POA: Diagnosis not present

## 2017-01-18 LAB — CBC
HEMATOCRIT: 42.5 % (ref 36.0–46.0)
Hemoglobin: 14.1 g/dL (ref 12.0–15.0)
MCH: 28.1 pg (ref 26.0–34.0)
MCHC: 33.2 g/dL (ref 30.0–36.0)
MCV: 84.8 fL (ref 78.0–100.0)
Platelets: 292 10*3/uL (ref 150–400)
RBC: 5.01 MIL/uL (ref 3.87–5.11)
RDW: 12.8 % (ref 11.5–15.5)
WBC: 10.5 10*3/uL (ref 4.0–10.5)

## 2017-01-18 LAB — ECHOCARDIOGRAM COMPLETE
Height: 65 in
Weight: 3723.2 oz

## 2017-01-18 LAB — COMPREHENSIVE METABOLIC PANEL
ALBUMIN: 2.8 g/dL — AB (ref 3.5–5.0)
ALT: 23 U/L (ref 14–54)
ANION GAP: 8 (ref 5–15)
AST: 19 U/L (ref 15–41)
Alkaline Phosphatase: 71 U/L (ref 38–126)
BILIRUBIN TOTAL: 0.3 mg/dL (ref 0.3–1.2)
BUN: 27 mg/dL — AB (ref 6–20)
CHLORIDE: 101 mmol/L (ref 101–111)
CO2: 28 mmol/L (ref 22–32)
Calcium: 8.7 mg/dL — ABNORMAL LOW (ref 8.9–10.3)
Creatinine, Ser: 1.08 mg/dL — ABNORMAL HIGH (ref 0.44–1.00)
GFR calc Af Amer: >60 mL/min (ref >60)
GFR calc non Af Amer: 53 mL/min — ABNORMAL LOW (ref >60)
GLUCOSE: 244 mg/dL — AB (ref 65–99)
POTASSIUM: 4.2 mmol/L (ref 3.5–5.1)
SODIUM: 137 mmol/L (ref 135–145)
TOTAL PROTEIN: 6.3 g/dL — AB (ref 6.5–8.1)

## 2017-01-18 LAB — GLUCOSE, CAPILLARY
GLUCOSE-CAPILLARY: 229 mg/dL — AB (ref 65–99)
Glucose-Capillary: 285 mg/dL — ABNORMAL HIGH (ref 65–99)
Glucose-Capillary: 380 mg/dL — ABNORMAL HIGH (ref 65–99)

## 2017-01-18 LAB — PROCALCITONIN

## 2017-01-18 LAB — LIPID PANEL
CHOL/HDL RATIO: 5.7 ratio
CHOLESTEROL: 143 mg/dL (ref 0–200)
HDL: 25 mg/dL — AB (ref >40)
LDL Cholesterol: 83 mg/dL (ref 0–99)
Triglycerides: 177 mg/dL — ABNORMAL HIGH (ref <150)
VLDL: 35 mg/dL (ref 0–40)

## 2017-01-18 MED ORDER — INSULIN ASPART 100 UNIT/ML ~~LOC~~ SOLN: 5.0000 [IU] | Freq: Three times a day (TID) | SUBCUTANEOUS | 11 refills | Status: DC

## 2017-01-18 MED ORDER — LEVEMIR FLEXTOUCH 100 UNIT/ML ~~LOC~~ SOPN: 50.0000 [IU] | PEN_INJECTOR | Freq: Every day | SUBCUTANEOUS | 1 refills | Status: DC

## 2017-01-18 MED ORDER — IPRATROPIUM-ALBUTEROL 0.5-2.5 (3) MG/3ML IN SOLN: 3.0000 mL | RESPIRATORY_TRACT | 0 refills | Status: DC | PRN

## 2017-01-18 MED ORDER — OSELTAMIVIR PHOSPHATE 75 MG PO CAPS
75.0000 mg | ORAL_CAPSULE | Freq: Two times a day (BID) | ORAL | 0 refills | Status: DC
Start: 2017-01-18 — End: 2017-10-30

## 2017-01-18 MED ORDER — AZITHROMYCIN 250 MG PO TABS: ORAL_TABLET | ORAL | 0 refills | Status: DC

## 2017-01-18 MED ORDER — INSULIN ASPART 100 UNIT/ML ~~LOC~~ SOLN: 0.0000 [IU] | Freq: Every day | SUBCUTANEOUS | 11 refills | Status: DC

## 2017-01-18 MED ORDER — PREDNISONE 10 MG PO TABS: ORAL_TABLET | ORAL | 0 refills | Status: DC

## 2017-01-18 MED ORDER — INSULIN ASPART 100 UNIT/ML ~~LOC~~ SOLN: 0.0000 [IU] | Freq: Three times a day (TID) | SUBCUTANEOUS | 11 refills | Status: DC

## 2017-01-18 NOTE — Care Management Note (Signed)
Case Management Note  Patient Details  Name: Teresa Kerr MRN: [id number] Date of Birth: 03/19/1951  Subjective/Objective:                 Independent patient from home in obs for Flu. Will DC today to home. Nebulizer for home use requested to be delivered to room prior to DC. No other CM needs, orders, consult identified at this time.   Action/Plan:  DC to home with nebulizer.   Expected Discharge Date:                  Expected Discharge Plan:  Home/Self Care  In-House Referral:     Discharge planning Services  CM Consult  Post Acute Care Choice:  Durable Medical Equipment Choice offered to:     DME Arranged:  Nebulizer/meds DME Agency:  [place]:    [facility] Agency:     Status of Service:  Completed, signed off  If discussed at [place] of Stay Meetings, dates discussed:    Additional Comments:  Carles Collet, RN 01/18/2017, 2:20 PM

## 2017-01-18 NOTE — Care Management Obs Status (Signed)
[place] NOTIFICATION   Patient Details  Name: Teresa Kerr MRN: [id number] Date of Birth: [birth year]   Medicare Observation Status Notification Given:  Yes    Carles Collet, RN 01/18/2017, 2:16 PM

## 2017-01-18 NOTE — Progress Notes (Signed)
Pt seen briefly this am,  Having some reproducile cp over the left chest.  Breathing not quite back to her normal status,  Will return later to reassess.  Pox ambulating and at rest off o2.  Discussed with RN .

## 2017-01-18 NOTE — Progress Notes (Signed)
Inpatient Diabetes Program Recommendations  AACE/ADA: New Consensus Statement on Inpatient Glycemic Control (2015)  Target Ranges:  Prepandial:   less than 140 mg/dL      Peak postprandial:   less than 180 mg/dL (1-2 hours)      Critically ill patients:  140 - 180 mg/dL   Results for Teresa Kerr, NAU (MRN [id number]) as of 01/18/2017 10:35  Ref. Range 01/17/2017 07:03 01/17/2017 12:12 01/17/2017 16:33 01/17/2017 20:28 01/18/2017 07:35  Glucose-Capillary Latest Ref Range: 65 - 99 mg/dL 366 (H) 334 (H) 344 (H) 325 (H) 229 (H)    Review of Glycemic Control  Diabetes history: DM2 Outpatient Diabetes medications: Levemir 40 units QHS Current orders for Inpatient glycemic control: Levemir 50 units QHS, Novolog 0-15 units TID with meals, Novolog 0-5 units QHS, Novolog 5 units TID with meals for meal coverage  Inpatient Diabetes Program Recommendations: Insulin - Basal: Please consider increasing Levemir to 60 units QHS. Correction (SSI): Please consider increasing Novolog correction to resistant scale (0-20 units). Insulin - Meal Coverage: Please consider increasing meal coverage to Novolog 8 units TID with meals. HgbA1C: Per patient, A1C was 14% at last office visit 1-2 weeks ago and Levemir dose was increased and patient was started on new oral DM medication but she has not started taking it yet.However, PCP office reports that patient is NOT prescribed any oral DM medications. Outpatient Follow Up:  At time of discharge, would recommend MD consider adjusting DM medication(s) and have patient follow up soon with PCP at the [facility].   Thanks, Barnie Alderman, RN, MSN, CDE Diabetes Coordinator Inpatient Diabetes Program [phone] (Team Pager from 8am to 5pm)

## 2017-01-18 NOTE — Care Management CC44 (Signed)
Condition Code 44 Documentation Completed  Patient Details  Name: Teresa Kerr MRN: [id number] Date of Birth: 11-29-1950   Condition Code 44 given:  Yes Patient signature on Condition Code 44 notice:  Yes Documentation of 2 MD's agreement:  Yes Code 44 added to claim:  Yes    Carles Collet, RN 01/18/2017, 2:17 PM

## 2017-01-18 NOTE — Discharge Summary (Signed)
Teresa Kerr, is a 66 y.o. female  DOB [birth year]  MRN [id number].  Admission date:  01/16/2017  Admitting Physician  Waldemar Dickens, MD  Discharge Date:  01/18/2017   Primary MD  Javier Docker, MD  Recommendations for primary care physician for things to follow:    Chest pain syndrome cardiac versus pleuritic HEART score 6 . Troponin 0.32 in the setting of O2 demand and respiratory infection/COPD , EKG Sinus tachycardia with Premature atrial complexes Right bundle branch block Left posterior fascicular block, r/o anterior infarct. CXR unrevealing. CT angio chest neg for PE . + coronary artery calcifications Cont plavix, cont lipitor Chest pain thought to be atypical, can have ischemic evaluation as outpatient after recovers from flu f/u with cardiology in 2weeks.   Pericardial effusion (small) f/u with cardiology in 2 weeks.   Acute respiratory distress without hypoxia, likely secondary to acute COPD/Asthma exacerbation and Flu +CXR and CTA NAD Tamiflu 75 mg bid  x3 days more Zithromax 250mg  po qday x 3 days Prednisone taper 40mg  poqday x2 d then 30mg  poqday x 2d then 20mg  poqday x2d, then 10mg  poqday x2.  Duoneb 1 po q4h prn Please f/u with pcp in 1-2 weeks.   Influenza B,  tamiflu as above  History of stroke, remote, s/p Coumadin x 6 months, none currently No acute neurological abnormalities  Continue plavix   Dm2: blood sugar uncontrolled  Increased her levemir to 50units [place] qday Started novolog 5units [place] tid ac meals  Hypertension  Continue home anti-hypertensive medications      Admission Diagnosis  Thyroid nodule [E04.1] Influenza B [J10.1] COPD exacerbation (HCC) [J44.1] NSTEMI (non-ST elevated myocardial infarction) ([place]) [I21.4]   Discharge Diagnosis  Thyroid nodule [E04.1] Influenza B [J10.1] COPD exacerbation (HCC) [J44.1] NSTEMI (non-ST elevated  myocardial infarction) ([place]) [I21.4]    Active Problems:   GOUT   OBESITY   Essential hypertension   ALLERGIC RHINITIS, SEASONAL   Asthma   History of stroke   Diabetes mellitus (HCC)   Influenza   Anxiety   Chest pain   Diabetes mellitus with complication (HCC)   COPD exacerbation (HCC)   Influenza B   Thyroid nodule   Acute respiratory failure with hypoxia (HCC)      Past Medical History:  Diagnosis Date  . Asthma   . Diabetes mellitus   . Gout   . Hypertension   . Neuromuscular disorder ([place])    right sides weakness arm and leg, steroid injections to right shoulder-Dr. Beane(last 8'16)  . Stroke [facility]) 2002, 1999   pt has right sided weakness at times, gets ESI when weakness returns  . Umbilical hernia   . Uterine fibroid     Past Surgical History:  Procedure Laterality Date  . ANGIOPLASTY     x 2 times-cerebral arterial stents- [place]  . CYSTOSCOPY WITH RETROGRADE PYELOGRAM, URETEROSCOPY AND STENT PLACEMENT Left 09/01/2015   Procedure: CYSTOSCOPY WITH LEFT RETROGRADE, LEFT URETEROSCOPY AND STENT PLACEMENT;  Surgeon: Irine Seal, MD;  Location: [facility];  Service: Urology;  Laterality: Left;  . HAND SURGERY    . HOLMIUM LASER APPLICATION Left 48/01/4626   Procedure: WITH HOLMIUM LASER ;  Surgeon: Irine Seal, MD;  Location: [facility];  Service: Urology;  Laterality: Left;  . TOE SURGERY         HPI  from the history and physical done on the day of admission:      66 y.o.femalewith medical history significant for remote stroke not on coumadinover the last 6 months, recurrent, 1st in 1999, the other one in 2002, COPD/asthma, diabetes, hypertensionpresenting with left-sided pleuritic chest pain, and shortness of breath, with symptoms beginning 2 days ago, progressively worse, keeping her from sleeping.The pain was worse with breathing. She denies any palpitations. She did report productive yellow phlegm. She also reported sweats and chills, with fevers up to 102  currently 100.4. She is not aware of sick contacts. No recent long distance trips. She is compliant with her mediter than right chronic lower extremity swelling, but denies any calf pain at this time.Denies nausea, heartburn or change in bowel habits. Denies abdominal pain. Appetite is normal. Denies any dysurications.She reports left greaa. Denies abnormal skin rashesDenies any bleeding issues such as epistaxis, hematemesis, hematuria or hematochezia. She does not smoke, drink alcohol or partakes recreational drug use.  ED Course:BP 112/83  Pulse 98  Temp (!) 100.4 F (38 C) (Oral)  Resp (!) 28  SpO2 95%  CMETin CBC within normal limits. Influenza by PCR is positive white count 10.9 hemoglobin 15.6 troponin 0.32 EKGSinus tachycardia RBBB and LPFB Probable inferior infarct, recent Lateral leads are also involved No acute changes. QTC 517 CT angiography chest, and chest x-ray negative for acute disease, or PE.  BNP pending lactic acid pending received Solu-Medrol 125 mgx1, Duoneb x1 With improvement of dyspnea SHe received NTG and ASA with some improvement of her CP Tamiflu initiated      Hospital Course:     Pt was admitted and dx with Copd /asthma exacerbation secondary to Flu.  tx with prednisone, zithromax, tamiflu and duoneb.  and cardiology consulted due to trop I elevation 0.25, 0.22  , thought to be initially ? Pericarditis , Echo, =prelim NL EF, per cardiology , unremarkable.  Trop elevated thought to be secondary to demand ischemia.  Pt can have outpatient w/up w stress testing per cardiology note.  The blood sugars were >350 initially and there was concern that prednisone would further increase her bs so she was continued as inpatient stay.  Her levemir increased to 50 units [place] qday, and started on novolog 5 units  tid ac meals and SSI.  Pt doesn't have chest pain presently and her breathing has improved . Pt feels stable and will be discharge to home.    Follow  UP  Follow-up Information    Javier Docker, MD Follow up in 1 week(s).   Specialty:  Internal Medicine Contact information: [address] [place] [address] [phone]        Minus Breeding, MD Follow up in 2 week(s).   Specialty:  Cardiology Contact information: [address] [place] [address] [phone]            Consults obtained - cardiology  Discharge Condition: stable  Diet and Activity recommendation: See Discharge Instructions below  Discharge Instructions         Discharge Medications     Allergies as of 01/18/2017  Reactions   Latex Itching, Rash   RE: "Powdered gloves"       Medication List    STOP taking these medications   ibuprofen 200 MG tablet Commonly known as:  ADVIL,MOTRIN   phenazopyridine 200 MG tablet Commonly known as:  PYRIDIUM     TAKE these medications   acetaminophen 325 MG tablet Commonly known as:  TYLENOL Take 325-650 mg by mouth every 6 (six) hours as needed for mild pain, moderate pain, fever or headache.   allopurinol 100 MG tablet Commonly known as:  ZYLOPRIM Take 100 mg by mouth daily.   amLODipine 10 MG tablet Commonly known as:  NORVASC Take 10 mg by mouth daily.   atorvastatin 10 MG tablet Commonly known as:  LIPITOR Take 10 mg by mouth daily at 6 PM.   azithromycin 250 MG tablet Commonly known as:  ZITHROMAX 250mg  po qday x 3 days Start taking on:  01/19/2017   cloNIDine 0.1 MG tablet Commonly known as:  CATAPRES Take 0.1 mg by mouth 2 (two) times daily.   clopidogrel 75 MG tablet Commonly known as:  PLAVIX Take 75 mg by mouth daily.   colchicine 0.6 MG tablet Take 0.6-1.2 mg by mouth See admin instructions. 1.2 mg with flares; take 0.6 mg if still present after one hour   DEXILANT 30 MG capsule Generic drug:  Dexlansoprazole Take 30 mg by mouth daily.   Fluticasone-Salmeterol 250-50 MCG/DOSE Aepb Commonly known as:  ADVAIR Inhale 2 puffs  into the lungs daily as needed (for flares).   furosemide 20 MG tablet Commonly known as:  LASIX Take 20 mg by mouth daily.   gabapentin 300 MG capsule Commonly known as:  NEURONTIN Take 300 mg by mouth 2 (two) times daily.   insulin aspart 100 UNIT/ML injection Commonly known as:  novoLOG Inject 0-15 Units into the skin 3 (three) times daily with meals.   insulin aspart 100 UNIT/ML injection Commonly known as:  novoLOG Inject 0-5 Units into the skin at bedtime.   insulin aspart 100 UNIT/ML injection Commonly known as:  novoLOG Inject 5 Units into the skin 3 (three) times daily before meals.   ipratropium-albuterol 0.5-2.5 (3) MG/3ML Soln Commonly known as:  DUONEB Take 3 mLs by nebulization every 4 (four) hours as needed.   LEVEMIR FLEXTOUCH 100 UNIT/ML Pen Generic drug:  Insulin Detemir Inject 50 Units into the skin at bedtime. What changed:  how much to take   lisinopril 40 MG tablet Commonly known as:  PRINIVIL,ZESTRIL Take 40 mg by mouth daily.   meloxicam 15 MG tablet Commonly known as:  MOBIC Take 15 mg by mouth daily.   ondansetron 4 MG tablet Commonly known as:  ZOFRAN Take 1 tablet (4 mg total) by mouth every 8 (eight) hours as needed for nausea or vomiting.   oseltamivir 75 MG capsule Commonly known as:  TAMIFLU Take 1 capsule (75 mg total) by mouth 2 (two) times daily.   oxyCODONE-acetaminophen 5-325 MG tablet Commonly known as:  PERCOCET/ROXICET Take 1 tablet by mouth every 4 (four) hours as needed for severe pain.   predniSONE 10 MG tablet Commonly known as:  DELTASONE 40mg  po qday x2d then 30mg  po qday x 2d then 20mg  po qday x2d then 10mg  po qday U9N   PROAIR RESPICLICK 235 (90 Base) MCG/ACT Aepb Generic drug:  Albuterol Sulfate Inhale 2 puffs into the lungs every 6 (six) hours as needed (shortness of breath).   promethazine 25 MG tablet Commonly known as:  PHENERGAN Take 1 tablet (25 mg total) by mouth every 6 (six) hours as needed for  refractory nausea / vomiting.   tiZANidine 2 MG tablet Commonly known as:  ZANAFLEX Take 2 mg by mouth 2 (two) times daily.            Durable Medical Equipment        Start     Ordered   01/18/17 1251  For home use only DME Nebulizer/meds  Once    Question:  Patient needs a nebulizer to treat with the following condition  Answer:  Asthma   01/18/17 1250      Major procedures and Radiology Reports - PLEASE review detailed and final reports for all details, in brief -      Dg Chest 2 View  Result Date: 01/16/2017 CLINICAL DATA:  Non radiating left chest pain. EXAM: CHEST  2 VIEW COMPARISON:  05/12/2012 FINDINGS: Normal heart size when accounting for lower mediastinal fat. Negative aortic and hilar contours. There is no edema, consolidation, effusion, or pneumothorax. Pleural based nodular density inferiorly on the right is stable from 2013, likely posttraumatic based on chest CT. Nodular density left apex from EKG pad. IMPRESSION: No acute finding. Electronically Signed   By: Monte Fantasia M.D.   On: 01/16/2017 13:02   Ct Angio Chest Pe W And/or Wo Contrast  Result Date: 01/16/2017 CLINICAL DATA:  Shortness of breath for 2 days. History of hypertension, diabetes, asthma, pneumonia and stroke. EXAM: CT ANGIOGRAPHY CHEST WITH CONTRAST TECHNIQUE: Multidetector CT imaging of the chest was performed using the standard protocol during bolus administration of intravenous contrast. Multiplanar CT image reconstructions and MIPs were obtained to evaluate the vascular anatomy. CONTRAST:  100 cc Isovue 370 COMPARISON:  Chest radiograph January 16, 2017 at 1255 hours and CT chest April 24, 2012 FINDINGS: CARDIOVASCULAR: Adequate contrast opacification of the pulmonary artery's. Main pulmonary artery is not enlarged. No pulmonary arterial filling defects to the level of the segmental branches, respiratory motion limits sensitivity for distal emboli. Heart size is mildly enlarged, no right heart  strain. Mild coronary artery calcifications. Small pericardial effusion. Thoracic aorta is normal course and caliber, mild calcific atherosclerosis. MEDIASTINUM/NODES: No lymphadenopathy by CT size criteria. LUNGS/PLEURA: Tracheobronchial tree is patent, no pneumothorax. Bronchial wall thickening. RIGHT pleural thickening without pleural effusion. LEFT lung base atelectasis. No focal consolidation, pulmonary nodules or masses. Mild centrilobular emphysema. UPPER ABDOMEN: Partially imaged thickened adrenal glands suggests hyperplasia. Hepatic steatosis. MUSCULOSKELETAL: Visualized soft tissues and included osseous structures are nonacute. Complex 3.1 cm RIGHT thyroid nodule. Cystic degenerative change RIGHT humeral head. Old RIGHT rib fractures. Mild degenerative change of thoracic spine. Review of the MIP images confirms the above findings. IMPRESSION: No acute pulmonary embolism. Bronchial wall thickening associated with bronchitis or reactive airway disease without focal consolidation. Mild emphysema. Mild cardiomegaly.  Small pericardial effusion. **An incidental finding of potential clinical significance has been found. Complex 3.1 cm RIGHT thyroid nodule for which follow up thyroid sonogram is recommended on a nonemergent basis. This follows ACR consensus guidelines: Managing Incidental Thyroid Nodules Detected on Imaging: White Paper of the ACR Incidental Thyroid Findings Committee. J Am Coll Radiol 2015; 12:143-150. ** Electronically Signed   By: Elon Alas M.D.   On: 01/16/2017 15:08    Micro Results     Recent Results (from the past 240 hour(s))  Respiratory Panel by PCR     Status: Abnormal   Collection Time: 01/16/17  2:11 AM  Result Value Ref Range Status  Adenovirus NOT DETECTED NOT DETECTED Final   Coronavirus 229E NOT DETECTED NOT DETECTED Final   Coronavirus HKU1 NOT DETECTED NOT DETECTED Final   Coronavirus NL63 NOT DETECTED NOT DETECTED Final   Coronavirus OC43 NOT DETECTED  NOT DETECTED Final   Metapneumovirus NOT DETECTED NOT DETECTED Final   Rhinovirus / Enterovirus NOT DETECTED NOT DETECTED Final   Influenza A NOT DETECTED NOT DETECTED Final   Influenza B DETECTED (A) NOT DETECTED Final   Parainfluenza Virus 1 NOT DETECTED NOT DETECTED Final   Parainfluenza Virus 2 NOT DETECTED NOT DETECTED Final   Parainfluenza Virus 3 NOT DETECTED NOT DETECTED Final   Parainfluenza Virus 4 NOT DETECTED NOT DETECTED Final   Respiratory Syncytial Virus NOT DETECTED NOT DETECTED Final   Bordetella pertussis NOT DETECTED NOT DETECTED Final   Chlamydophila pneumoniae NOT DETECTED NOT DETECTED Final   Mycoplasma pneumoniae NOT DETECTED NOT DETECTED Final  Blood culture (routine x 2)     Status: None (Preliminary result)   Collection Time: 01/16/17  1:20 PM  Result Value Ref Range Status   Specimen Description BLOOD LEFT FOREARM  Final   Special Requests   Final    BOTTLES DRAWN AEROBIC AND ANAEROBIC Blood Culture adequate volume   Culture NO GROWTH 2 DAYS  Final   Report Status PENDING  Incomplete  Blood culture (routine x 2)     Status: None (Preliminary result)   Collection Time: 01/16/17  4:15 PM  Result Value Ref Range Status   Specimen Description BLOOD RIGHT ANTECUBITAL  Final   Special Requests   Final    BOTTLES DRAWN AEROBIC AND ANAEROBIC Blood Culture adequate volume   Culture NO GROWTH 2 DAYS  Final   Report Status PENDING  Incomplete       Today   Subjective    Cecylia Danh today has no headache,no chest abdominal pain,no new weakness tingling or numbness, feels much better wants to go home today.    Objective   Blood pressure 138/82, pulse 84, temperature 97.8 F (36.6 C), temperature source Oral, resp. rate 20, height 5\' 5"  (1.651 m), weight 105.6 kg (232 lb 11.2 oz), SpO2 95 %.   Intake/Output Summary (Last 24 hours) at 01/18/17 1257 Last data filed at 01/17/17 2300  Gross per 24 hour  Intake              440 ml  Output                 0 ml  Net              440 ml    Exam Awake Alert, Oriented x 3, No new F.N deficits, Normal affect [place].AT,PERRAL Supple Neck,No JVD, No cervical lymphadenopathy appriciated.  Symmetrical Chest wall movement, Good air movement bilaterally, slight exp wheeze mostly upper airway. No crackles RRR,No Gallops,Rubs or new Murmurs, No Parasternal Heave +ve B.Sounds, Abd Soft, Non tender, No organomegaly appriciated, No rebound -guarding or rigidity. No Cyanosis, Clubbing or edema, No new Rash or bruise   Data Review   CBC w Diff:  Lab Results  Component Value Date   WBC 10.5 01/18/2017   HGB 14.1 01/18/2017   HCT 42.5 01/18/2017   PLT 292 01/18/2017   LYMPHOPCT 11 08/09/2015   MONOPCT 7 08/09/2015   EOSPCT 0 08/09/2015   BASOPCT 0 08/09/2015    CMP:  Lab Results  Component Value Date   NA 137 01/18/2017   K 4.2 01/18/2017   CL 101  01/18/2017   CO2 28 01/18/2017   BUN 27 (H) 01/18/2017   CREATININE 1.08 (H) 01/18/2017   PROT 6.3 (L) 01/18/2017   ALBUMIN 2.8 (L) 01/18/2017   BILITOT 0.3 01/18/2017   ALKPHOS 71 01/18/2017   AST 19 01/18/2017   ALT 23 01/18/2017  .   Total Time in preparing paper work, data evaluation and todays exam - 58 minutes  Jani Gravel M.D on 01/18/2017 at 12:57 PM  [facility]   Office  [phone]

## 2017-01-18 NOTE — Progress Notes (Signed)
  Echocardiogram 2D Echocardiogram has been performed.  Darlina Sicilian M 01/18/2017, 11:03 AM

## 2017-01-18 NOTE — Progress Notes (Signed)
Progress Note  Patient Name: Teresa Kerr Date of Encounter: 01/18/2017  Primary Cardiologist: Dr. Percival Spanish  Subjective   Still with some chest pain with deep inspiration.  Improved.    Inpatient Medications    Scheduled Meds: . allopurinol  100 mg Oral Daily  . aspirin EC  81 mg Oral Daily  . atorvastatin  10 mg Oral q1800  . azithromycin  250 mg Oral Daily  . carvedilol  3.125 mg Oral BID WC  . clopidogrel  75 mg Oral Daily  . enoxaparin (LOVENOX) injection  40 mg Subcutaneous Q24H  . furosemide  20 mg Oral Daily  . gabapentin  300 mg Oral BID  . insulin aspart  0-15 Units Subcutaneous TID WC  . insulin aspart  0-5 Units Subcutaneous QHS  . insulin aspart  5 Units Subcutaneous TID AC  . insulin detemir  50 Units Subcutaneous QHS  . losartan  25 mg Oral Daily  . meloxicam  15 mg Oral Daily  . oseltamivir  75 mg Oral BID  . pantoprazole  40 mg Oral Daily  . predniSONE  40 mg Oral Q breakfast   Continuous Infusions:  PRN Meds: acetaminophen, ipratropium-albuterol   Vital Signs    Vitals:   01/17/17 2026 01/18/17 0634 01/18/17 0737 01/18/17 0833  BP: 132/85 128/82 (!) 126/93   Pulse: 79 73 88 97  Resp: 20 20 20    Temp: 98 F (36.7 C) 97.8 F (36.6 C) 97.8 F (36.6 C)   TempSrc: Oral Oral Oral   SpO2: 95% 96% 96%   Weight:  232 lb 11.2 oz (105.6 kg)    Height:        Intake/Output Summary (Last 24 hours) at 01/18/17 1133 Last data filed at 01/17/17 2300  Gross per 24 hour  Intake              440 ml  Output                0 ml  Net              440 ml   Filed Weights   01/16/17 1954 01/17/17 0605 01/18/17 0634  Weight: 235 lb 6.4 oz (106.8 kg) 233 lb 1.6 oz (105.7 kg) 232 lb 11.2 oz (105.6 kg)    Telemetry    NSR, rare PVCs - Personally Reviewed  ECG    NA - Personally Reviewed  Physical Exam   GEN: No acute distress.   Neck: No  JVD Cardiac: RRR, no murmurs, rubs, or gallops.  Respiratory:  Decreased breath sounds with some  expiratory wheezing. GI: Soft, nontender, non-distended  MS: No edema; No deformity. Neuro:  Nonfocal  Psych: Normal affect   Labs    Chemistry Recent Labs Lab 01/16/17 1228 01/17/17 0524 01/18/17 0526  NA 137 136 137  K 4.3 4.3 4.2  CL 103 97* 101  CO2 24 26 28   GLUCOSE 236* 406* 244*  BUN 13 20 27*  CREATININE 0.93 1.11* 1.08*  CALCIUM 8.9 9.1 8.7*  PROT  --   --  6.3*  ALBUMIN  --   --  2.8*  AST  --   --  19  ALT  --   --  23  ALKPHOS  --   --  71  BILITOT  --   --  0.3  GFRNONAA >60 52* 53*  GFRAA >60 60* >60  ANIONGAP 10 13 8      Hematology Recent Labs Lab 01/16/17 1228  01/17/17 0036 01/18/17 0526  WBC 10.9* 10.5 10.5  RBC 5.37* 5.40* 5.01  HGB 15.6* 15.4* 14.1  HCT 45.6 46.2* 42.5  MCV 84.9 85.6 84.8  MCH 29.1 28.5 28.1  MCHC 34.2 33.3 33.2  RDW 12.7 13.0 12.8  PLT 283 293 292    Cardiac Enzymes Recent Labs Lab 01/16/17 1845 01/16/17 2326  TROPONINI 0.25* 0.22*    Recent Labs Lab 01/16/17 1239  TROPIPOC 0.32*     BNP Recent Labs Lab 01/16/17 1228  BNP 118.4*    Lab Results  Component Value Date   CHOL 143 01/18/2017   TRIG 177 (H) 01/18/2017   HDL 25 (L) 01/18/2017   LDLCALC 83 01/18/2017    DDimer No results for input(s): DDIMER in the last 168 hours.   Radiology    Dg Chest 2 View  Result Date: 01/16/2017 CLINICAL DATA:  Non radiating left chest pain. EXAM: CHEST  2 VIEW COMPARISON:  05/12/2012 FINDINGS: Normal heart size when accounting for lower mediastinal fat. Negative aortic and hilar contours. There is no edema, consolidation, effusion, or pneumothorax. Pleural based nodular density inferiorly on the right is stable from 2013, likely posttraumatic based on chest CT. Nodular density left apex from EKG pad. IMPRESSION: No acute finding. Electronically Signed   By: Monte Fantasia M.D.   On: 01/16/2017 13:02   Ct Angio Chest Pe W And/or Wo Contrast  Result Date: 01/16/2017 CLINICAL DATA:  Shortness of breath for 2  days. History of hypertension, diabetes, asthma, pneumonia and stroke. EXAM: CT ANGIOGRAPHY CHEST WITH CONTRAST TECHNIQUE: Multidetector CT imaging of the chest was performed using the standard protocol during bolus administration of intravenous contrast. Multiplanar CT image reconstructions and MIPs were obtained to evaluate the vascular anatomy. CONTRAST:  100 cc Isovue 370 COMPARISON:  Chest radiograph January 16, 2017 at 1255 hours and CT chest April 24, 2012 FINDINGS: CARDIOVASCULAR: Adequate contrast opacification of the pulmonary artery's. Main pulmonary artery is not enlarged. No pulmonary arterial filling defects to the level of the segmental branches, respiratory motion limits sensitivity for distal emboli. Heart size is mildly enlarged, no right heart strain. Mild coronary artery calcifications. Small pericardial effusion. Thoracic aorta is normal course and caliber, mild calcific atherosclerosis. MEDIASTINUM/NODES: No lymphadenopathy by CT size criteria. LUNGS/PLEURA: Tracheobronchial tree is patent, no pneumothorax. Bronchial wall thickening. RIGHT pleural thickening without pleural effusion. LEFT lung base atelectasis. No focal consolidation, pulmonary nodules or masses. Mild centrilobular emphysema. UPPER ABDOMEN: Partially imaged thickened adrenal glands suggests hyperplasia. Hepatic steatosis. MUSCULOSKELETAL: Visualized soft tissues and included osseous structures are nonacute. Complex 3.1 cm RIGHT thyroid nodule. Cystic degenerative change RIGHT humeral head. Old RIGHT rib fractures. Mild degenerative change of thoracic spine. Review of the MIP images confirms the above findings. IMPRESSION: No acute pulmonary embolism. Bronchial wall thickening associated with bronchitis or reactive airway disease without focal consolidation. Mild emphysema. Mild cardiomegaly.  Small pericardial effusion. **An incidental finding of potential clinical significance has been found. Complex 3.1 cm RIGHT thyroid nodule  for which follow up thyroid sonogram is recommended on a nonemergent basis. This follows ACR consensus guidelines: Managing Incidental Thyroid Nodules Detected on Imaging: White Paper of the ACR Incidental Thyroid Findings Committee. J Am Coll Radiol 2015; 12:143-150. ** Electronically Signed   By: Elon Alas M.D.   On: 01/16/2017 15:08    Cardiac Studies   ECHO:  Pending. (Prelim is essentially unremarkable.)   Patient Profile     66 y.o. female w/ a h/o asthma, DM, HTN, obesity,  and stroke who was admitted 4/25 with fever, cough, and pleuritic chest/left axillary pain, Flu, and elevated troponins.  CTA neg for PE but notable for coronary Ca2+.  Assessment & Plan    CHEST PAIN:  Non anginal in nature.  Echo is unremarkable.  (I reviewed the images today personally.)  Plan out patient Lexiscan Myoview.  HTN:  BP OK.  Continue current therapy.   DM:  Not well controlled.  Discussed with the patient.  Management per primary team.   DYSLIPIDEMIA:  LDL as above.  Continue current therapy.     Signed, Minus Breeding, MD  01/18/2017, 11:33 AM

## 2017-01-19 LAB — HEMOGLOBIN A1C
HEMOGLOBIN A1C: 13.1 % — AB (ref 4.8–5.6)
MEAN PLASMA GLUCOSE: 329 mg/dL

## 2017-01-21 LAB — CULTURE, BLOOD (ROUTINE X 2)
CULTURE: NO GROWTH
Culture: NO GROWTH
SPECIAL REQUESTS: ADEQUATE
SPECIAL REQUESTS: ADEQUATE

## 2017-01-30 NOTE — Progress Notes (Signed)
Cardiology Office Note   Date:  01/31/2017   ID:  Teresa Kerr, DOB 04-Jan-1951, MRN [id number]  PCP:  Javier Docker, MD  Cardiologist:   Minus Breeding, MD    Chief Complaint  Patient presents with  . Shortness of Breath      History of Present Illness: Teresa Kerr is a 66 y.o. female who presents for follow up after a hospitalization last month.  She had COPD/asthma exacerbation and influenza B.  She did have an elevated troponin.  CT demonstrated no PE but she did have coronary calcification.    Since discharge she still has cough and dyspnea and is only slowly getting better from the flu.  She has chest pain but only with cough.  This is unchanged from the pain that she had in the hospital.  The patient denies any new symptoms such as neck or arm discomfort. There has been no new shortness of breath, PND or orthopnea. There have been no reported palpitations, presyncope or syncope.   Past Medical History:  Diagnosis Date  . Asthma   . Diabetes mellitus   . Gout   . Hypertension   . Neuromuscular disorder ([place])    right sides weakness arm and leg, steroid injections to right shoulder-Dr. Beane(last 8'16)  . Stroke [facility]) 2002, 1999   pt has right sided weakness at times, gets ESI when weakness returns  . Umbilical hernia   . Uterine fibroid     Past Surgical History:  Procedure Laterality Date  . ANGIOPLASTY     x 2 times-cerebral arterial stents- [facility]  . CYSTOSCOPY WITH RETROGRADE PYELOGRAM, URETEROSCOPY AND STENT PLACEMENT Left 09/01/2015   Procedure: CYSTOSCOPY WITH LEFT RETROGRADE, LEFT URETEROSCOPY AND STENT PLACEMENT;  Surgeon: Irine Seal, MD;  Location: [facility];  Service: Urology;  Laterality: Left;  . HAND SURGERY    . HOLMIUM LASER APPLICATION Left 40/05/8118   Procedure: WITH HOLMIUM LASER ;  Surgeon: Irine Seal, MD;  Location: [facility];  Service: Urology;  Laterality: Left;  . TOE SURGERY       Current Outpatient Prescriptions    Medication Sig Dispense Refill  . acetaminophen (TYLENOL) 325 MG tablet Take 325-650 mg by mouth every 6 (six) hours as needed for mild pain, moderate pain, fever or headache.    . allopurinol (ZYLOPRIM) 100 MG tablet Take 100 mg by mouth daily.    Marland Kitchen amLODipine (NORVASC) 10 MG tablet Take 10 mg by mouth daily.      Marland Kitchen atorvastatin (LIPITOR) 10 MG tablet Take 10 mg by mouth daily at 6 PM.   3  . azithromycin (ZITHROMAX) 250 MG tablet 250mg  po qday x 3 days 3 each 0  . cloNIDine (CATAPRES) 0.1 MG tablet Take 0.1 mg by mouth 2 (two) times daily.    . clopidogrel (PLAVIX) 75 MG tablet Take 75 mg by mouth daily.     . colchicine 0.6 MG tablet Take 0.6-1.2 mg by mouth See admin instructions. 1.2 mg with flares; take 0.6 mg if still present after one hour    . DEXILANT 30 MG capsule Take 30 mg by mouth daily.    . Fluticasone-Salmeterol (ADVAIR) 250-50 MCG/DOSE AEPB Inhale 2 puffs into the lungs daily as needed (for flares).     . furosemide (LASIX) 20 MG tablet Take 20 mg by mouth daily.     Marland Kitchen gabapentin (NEURONTIN) 300 MG capsule Take 300 mg by mouth 2 (two) times daily.    . insulin  aspart (NOVOLOG) 100 UNIT/ML injection Inject 0-15 Units into the skin 3 (three) times daily with meals. 10 mL 11  . insulin aspart (NOVOLOG) 100 UNIT/ML injection Inject 0-5 Units into the skin at bedtime. 10 mL 11  . insulin aspart (NOVOLOG) 100 UNIT/ML injection Inject 5 Units into the skin 3 (three) times daily before meals. 10 mL 11  . ipratropium-albuterol (DUONEB) 0.5-2.5 (3) MG/3ML SOLN Take 3 mLs by nebulization every 4 (four) hours as needed. 360 mL 0  . LEVEMIR FLEXTOUCH 100 UNIT/ML Pen Inject 50 Units into the skin at bedtime. 15 mL 1  . lisinopril (PRINIVIL,ZESTRIL) 40 MG tablet Take 40 mg by mouth daily.   3  . meloxicam (MOBIC) 15 MG tablet Take 15 mg by mouth daily.    . ondansetron (ZOFRAN) 4 MG tablet Take 1 tablet (4 mg total) by mouth every 8 (eight) hours as needed for nausea or vomiting. 20 tablet 0   . oseltamivir (TAMIFLU) 75 MG capsule Take 1 capsule (75 mg total) by mouth 2 (two) times daily. 6 capsule 0  . oxyCODONE-acetaminophen (PERCOCET/ROXICET) 5-325 MG tablet Take 1 tablet by mouth every 4 (four) hours as needed for severe pain. 20 tablet 0  . predniSONE (DELTASONE) 10 MG tablet 40mg  po qday x2d then 30mg  po qday x 2d then 20mg  po qday x2d then 10mg  po qday x2d 20 tablet 0  . PROAIR RESPICLICK 782 (90 BASE) MCG/ACT AEPB Inhale 2 puffs into the lungs every 6 (six) hours as needed (shortness of breath).   3  . tiZANidine (ZANAFLEX) 2 MG tablet Take 2 mg by mouth 2 (two) times daily.     No current facility-administered medications for this visit.     Allergies:   Latex     ROS:  Please see the history of present illness.   Otherwise, review of systems are positive for none.   All other systems are reviewed and negative.    PHYSICAL EXAM: VS:  BP (!) 142/76   Pulse 80   Ht 5\' 5"  (1.651 m)   Wt 240 lb (108.9 kg)   BMI 39.94 kg/m  , BMI Body mass index is 39.94 kg/m. GENERAL:  Well appearing NECK:  No jugular venous distention, waveform within normal limits, carotid upstroke brisk and symmetric, no bruits, no thyromegaly LUNGS:  Decreased breath sounds.  Mild expiratory wheezing  BACK:  No CVA tenderness CHEST:  Unremarkable HEART:  PMI not displaced or sustained,S1 and S2 within normal limits, no S3, no S4, no clicks, no rubs, no murmurs ABD:  Flat, positive bowel sounds normal in frequency in pitch, no bruits, no rebound, no guarding, no midline pulsatile mass, no hepatomegaly, no splenomegaly EXT:  2 plus pulses throughout, left leg chronic edema moderate right edema mild, no cyanosis no clubbing    EKG:  EKG is ordered today. The ekg ordered today demonstrates Sinus rhythm, rate 80, right bundle branch block, no acute ST-T wave changes.   Recent Labs: 01/16/2017: B Natriuretic Peptide 118.4 01/18/2017: ALT 23; BUN 27; Creatinine, Ser 1.08; Hemoglobin 14.1;  Platelets 292; Potassium 4.2; Sodium 137    Lipid Panel    Component Value Date/Time   CHOL 143 01/18/2017 0526   TRIG 177 (H) 01/18/2017 0526   HDL 25 (L) 01/18/2017 0526   CHOLHDL 5.7 01/18/2017 0526   VLDL 35 01/18/2017 0526   LDLCALC 83 01/18/2017 0526      Wt Readings from Last 3 Encounters:  01/31/17 240 lb (108.9 kg)  01/18/17 232 lb 11.2 oz (105.6 kg)  09/01/15 258 lb 4 oz (117.1 kg)      Other studies Reviewed: Additional studies/ records that were reviewed today include: Hospital records. Review of the above records demonstrates:  Please see elsewhere in the note.     ASSESSMENT AND PLAN:  ELEVATED TROPONIN:    Lexiscan Myoview is indicated.  Her current pain is musculoskeletal.    PERICARDIAL EFFUSION:  She had a small pericardial effusion on CT.  However, there was no evidence of this on echo.  No follow up is indicated.  Marland Kitchen    HTN:  BP is slightly high but she did not take her meds today.  No change in meds is planned.  RBBB:  No change in therapy or further testing is planned.  CORONARY CALCIFICATION:  She needs continued risk reduction.     Current medicines are reviewed at length with the patient today.  The patient does not have concerns regarding medicines.  The following changes have been made:  no change  Labs/ tests ordered today include:   Orders Placed This Encounter  Procedures  . Myocardial Perfusion Imaging  . EKG 12-Lead     Disposition:   FU with me as needed.     Signed, Minus Breeding, MD  01/31/2017 6:27 PM    [place]

## 2017-01-31 ENCOUNTER — Encounter: Payer: Self-pay | Admitting: Cardiology

## 2017-01-31 ENCOUNTER — Ambulatory Visit (INDEPENDENT_AMBULATORY_CARE_PROVIDER_SITE_OTHER): Payer: Medicare Other | Admitting: Cardiology

## 2017-01-31 VITALS — BP 142/76 | HR 80 | Ht 65.0 in | Wt 240.0 lb

## 2017-01-31 DIAGNOSIS — R748 Abnormal levels of other serum enzymes: Secondary | ICD-10-CM | POA: Diagnosis not present

## 2017-01-31 DIAGNOSIS — I4510 Unspecified right bundle-branch block: Secondary | ICD-10-CM | POA: Diagnosis not present

## 2017-01-31 DIAGNOSIS — R778 Other specified abnormalities of plasma proteins: Secondary | ICD-10-CM

## 2017-01-31 DIAGNOSIS — I10 Essential (primary) hypertension: Secondary | ICD-10-CM

## 2017-01-31 DIAGNOSIS — R7989 Other specified abnormal findings of blood chemistry: Principal | ICD-10-CM

## 2017-01-31 NOTE — Patient Instructions (Signed)
Medication Instructions:  Continue current medications  Labwork: None Ordered  Testing/Procedures: Your physician has requested that you have a lexiscan myoview. For further information please visit [email or web address]. Please follow instruction sheet, as given.  Follow-Up: Your physician recommends that you schedule a follow-up appointment in: As Needed   Any Other Special Instructions Will Be Listed Below (If Applicable).   If you need a refill on your cardiac medications before your next appointment, please call your pharmacy.   

## 2017-02-06 ENCOUNTER — Telehealth (HOSPITAL_COMMUNITY): Payer: Self-pay

## 2017-02-06 NOTE — Telephone Encounter (Signed)
Encounter complete. 

## 2017-02-07 ENCOUNTER — Inpatient Hospital Stay (HOSPITAL_COMMUNITY): Admission: RE | Admit: 2017-02-07 | Payer: Medicare Other | Source: Ambulatory Visit

## 2017-02-08 ENCOUNTER — Ambulatory Visit (HOSPITAL_COMMUNITY): Payer: Medicare Other

## 2017-02-21 ENCOUNTER — Telehealth (HOSPITAL_COMMUNITY): Payer: Self-pay

## 2017-02-21 NOTE — Telephone Encounter (Signed)
Encounter complete. 

## 2017-02-26 ENCOUNTER — Inpatient Hospital Stay (HOSPITAL_COMMUNITY): Admission: RE | Admit: 2017-02-26 | Payer: Medicare Other | Source: Ambulatory Visit

## 2017-02-27 ENCOUNTER — Inpatient Hospital Stay (HOSPITAL_COMMUNITY): Admission: RE | Admit: 2017-02-27 | Payer: Medicare Other | Source: Ambulatory Visit

## 2017-02-27 ENCOUNTER — Ambulatory Visit (HOSPITAL_COMMUNITY): Payer: Medicare Other

## 2017-03-12 ENCOUNTER — Telehealth (HOSPITAL_COMMUNITY): Payer: Self-pay

## 2017-03-12 NOTE — Telephone Encounter (Signed)
Encounter complete. 

## 2017-03-19 ENCOUNTER — Ambulatory Visit (HOSPITAL_COMMUNITY): Payer: Medicare Other

## 2017-03-20 ENCOUNTER — Inpatient Hospital Stay (HOSPITAL_COMMUNITY)
Admission: RE | Admit: 2017-03-20 | Payer: Medicare Other | Source: Ambulatory Visit | Attending: Cardiology | Admitting: Cardiology

## 2017-03-22 ENCOUNTER — Encounter (INDEPENDENT_AMBULATORY_CARE_PROVIDER_SITE_OTHER): Payer: Self-pay | Admitting: Orthopedic Surgery

## 2017-03-22 ENCOUNTER — Ambulatory Visit (INDEPENDENT_AMBULATORY_CARE_PROVIDER_SITE_OTHER): Payer: Medicare Other | Admitting: Orthopedic Surgery

## 2017-03-22 ENCOUNTER — Ambulatory Visit (INDEPENDENT_AMBULATORY_CARE_PROVIDER_SITE_OTHER): Payer: Medicare Other

## 2017-03-22 DIAGNOSIS — G8929 Other chronic pain: Secondary | ICD-10-CM

## 2017-03-22 DIAGNOSIS — M25512 Pain in left shoulder: Secondary | ICD-10-CM | POA: Diagnosis not present

## 2017-03-22 MED ORDER — OXYCODONE-ACETAMINOPHEN 5-325 MG PO TABS: 1.0000 | ORAL_TABLET | Freq: Two times a day (BID) | ORAL | 0 refills | Status: DC | PRN

## 2017-03-22 NOTE — Progress Notes (Signed)
Office Visit Note   Patient: Teresa Kerr           Date of Birth: 07-08-1951           MRN: [id number] Visit Date: 03/22/2017 Requested by: Javier Docker, MD [address] [place], [place] [address] PCP: Javier Docker, MD  Subjective: Chief Complaint  Patient presents with  . Left Shoulder - Pain    HPI: Teresa Kerr is a 66 year old patient with left shoulder pain.  She slipped and fell on her arm in May on some [place] [employer] by her report.  Tried to catch himself and she "pulled her shoulder" denies any radicular pain.  By her history she has a known rotator cuff tear on both sides.  She's had 3 strokes and she was on Coumadin but now is on Plavix.  Typically she gets injections into her shoulder.  She has been a patient [facility] [place] but she was discharged from the practice for unclear reasons and was told to find another Doctor, general practice.  She gets multiple injections in multiple places.  Her last right shoulder injection was in April left shoulder was injected in May.  She denies any course grinding or new weakness in that left shoulders since the fall.  She does feel like she has had some type of injury to the left shoulder however.              ROS: All systems reviewed are negative as they relate to the chief complaint within the history of present illness.  Patient denies  fevers or chills.   Assessment & Plan: Visit Diagnoses:  1. Chronic left shoulder pain     Plan: Impression is left shoulder pain and history of rotator cuff tear and bursitis with aggravation of existing rotator cuff tear bursitis from a slip and fall injury in May.  Recently had an injection and thus cannot have another one really for 6-8 months.  I would try her with a one-time prescription of pain medicine because she is really on multiple other medications.  No refills.  Follow-up in November if her symptoms recur.  Last prescription written for this was in  April.  We talked about physical therapy also she's had that and doesn't really want to do that anymore.  Follow-Up Instructions: No Follow-up on file.   Orders:  Orders Placed This Encounter  Procedures  . XR Shoulder Left   No orders of the defined types were placed in this encounter.     Procedures: No procedures performed   Clinical Data: No additional findings.  Objective: Vital Signs: There were no vitals taken for this visit.  Physical Exam:   Constitutional: Patient appears well-developed HEENT:  Head: Normocephalic Eyes:EOM are normal Neck: Normal range of motion Cardiovascular: Normal rate Pulmonary/chest: Effort normal Neurologic: Patient is alert Skin: Skin is warm Psychiatric: Patient has normal mood and affect    Ortho Exam: Orthopedic exam demonstrates normal gait and alignment good cervical spine range of motion 5 out of 5 grip EPL FPL interosseous wrist flexion-extension biceps triceps and deltoid. strength.  Does have a little bit of coarseness and grinding with internal/external rotation of both shoulders.  Rotator cuff strength is intact below shoulder level.  Does have some coarse grinding with above level motion.  Subscap strength is intact bilaterally.  No acromioclavicular joint tenderness bilaterally.  No other masses lymph adenopathy or skin changes noted in the shoulder region.  Specialty Comments:  No specialty comments available.  Imaging: Xr Shoulder Left  Result Date: 03/22/2017 Left shoulder AP axillary and outlet views reviewed.  The visualized lung fields clear.  No arthritis in the glenohumeral joint there is spurring off the anterolateral aspect of the acromion.  No other significant bony changes or abnormalities noted.  Slight osteopenia of the proximal humeral shoulder girdle    PMFS History: Patient Active Problem List   Diagnosis Date Noted  . RBBB 01/31/2017  . Elevated troponin 01/31/2017  . Influenza 01/16/2017  .  Anxiety 01/16/2017  . Chest pain 01/16/2017  . Diabetes mellitus with complication ([place])   . COPD exacerbation ([place])   . Influenza B   . Thyroid nodule   . Acute respiratory failure with hypoxia ([place])   . Pleural effusion, R, ? hemothorax 04/24/2012  . Community acquired pneumonia 04/20/2012  . Hypoxia 04/20/2012  . Shingles 04/20/2012  . Rib pain 04/20/2012  . Hypertension 04/20/2012  . History of stroke 04/20/2012  . Chronic anticoagulation 04/20/2012  . Diabetes mellitus ([place]) 04/20/2012  . FIBROIDS, UTERUS 05/16/2007  . HX, PERSONAL, TOBACCO USE 05/16/2007  . GOUT 05/14/2007  . OBESITY 05/14/2007  . Essential hypertension 05/14/2007  . ALLERGIC RHINITIS, SEASONAL 05/14/2007  . Asthma 05/14/2007   Past Medical History:  Diagnosis Date  . Asthma   . Diabetes mellitus   . Gout   . Hypertension   . Neuromuscular disorder ([place])    right sides weakness arm and leg, steroid injections to right shoulder-Dr. Beane(last 8'16)  . Stroke [facility]) 2002, 1999   pt has right sided weakness at times, gets ESI when weakness returns  . Umbilical hernia   . Uterine fibroid     Family History  Problem Relation Age of Onset  . Cancer Mother        breast/ had mastecomy  . Cancer Father        colon    Past Surgical History:  Procedure Laterality Date  . ANGIOPLASTY     x 2 times-cerebral arterial stents- [facility]  . CYSTOSCOPY WITH RETROGRADE PYELOGRAM, URETEROSCOPY AND STENT PLACEMENT Left 09/01/2015   Procedure: CYSTOSCOPY WITH LEFT RETROGRADE, LEFT URETEROSCOPY AND STENT PLACEMENT;  Surgeon: Irine Seal, MD;  Location: [facility];  Service: Urology;  Laterality: Left;  . HAND SURGERY    . HOLMIUM LASER APPLICATION Left 11/24/1222   Procedure: WITH HOLMIUM LASER ;  Surgeon: Irine Seal, MD;  Location: [facility];  Service: Urology;  Laterality: Left;  . TOE SURGERY     Social History   Occupational History  . Not on file.   Social History Main Topics  . Smoking status: Former Smoker      Packs/day: 1.50    Years: 34.00    Quit date: 09/24/1997  . Smokeless tobacco: Never Used  . Alcohol use No  . Drug use: No  . Sexual activity: Not Currently    Birth control/ protection: Post-menopausal

## 2017-04-04 ENCOUNTER — Telehealth (HOSPITAL_COMMUNITY): Payer: Self-pay

## 2017-04-04 NOTE — Telephone Encounter (Signed)
Encounter complete. 

## 2017-04-09 ENCOUNTER — Ambulatory Visit (HOSPITAL_COMMUNITY)
Admission: RE | Admit: 2017-04-09 | Discharge: 2017-04-09 | Disposition: A | Discharge status: Home / Self Care | Payer: Medicare Other | Source: Ambulatory Visit | Attending: Cardiovascular Disease | Admitting: Cardiovascular Disease

## 2017-04-09 DIAGNOSIS — E041 Nontoxic single thyroid nodule: Secondary | ICD-10-CM | POA: Diagnosis not present

## 2017-04-09 DIAGNOSIS — Z8673 Personal history of transient ischemic attack (TIA), and cerebral infarction without residual deficits: Secondary | ICD-10-CM | POA: Insufficient documentation

## 2017-04-09 DIAGNOSIS — Z6841 Body Mass Index (BMI) 40.0 and over, adult: Secondary | ICD-10-CM | POA: Insufficient documentation

## 2017-04-09 DIAGNOSIS — E669 Obesity, unspecified: Secondary | ICD-10-CM | POA: Diagnosis not present

## 2017-04-09 DIAGNOSIS — I2510 Atherosclerotic heart disease of native coronary artery without angina pectoris: Secondary | ICD-10-CM | POA: Insufficient documentation

## 2017-04-09 DIAGNOSIS — R079 Chest pain, unspecified: Secondary | ICD-10-CM | POA: Diagnosis not present

## 2017-04-09 DIAGNOSIS — G709 Myoneural disorder, unspecified: Secondary | ICD-10-CM | POA: Diagnosis not present

## 2017-04-09 DIAGNOSIS — Z87891 Personal history of nicotine dependence: Secondary | ICD-10-CM | POA: Diagnosis not present

## 2017-04-09 DIAGNOSIS — R748 Abnormal levels of other serum enzymes: Secondary | ICD-10-CM

## 2017-04-09 DIAGNOSIS — R0609 Other forms of dyspnea: Secondary | ICD-10-CM | POA: Diagnosis not present

## 2017-04-09 DIAGNOSIS — R778 Other specified abnormalities of plasma proteins: Secondary | ICD-10-CM

## 2017-04-09 DIAGNOSIS — E119 Type 2 diabetes mellitus without complications: Secondary | ICD-10-CM | POA: Insufficient documentation

## 2017-04-09 DIAGNOSIS — I252 Old myocardial infarction: Secondary | ICD-10-CM | POA: Insufficient documentation

## 2017-04-09 DIAGNOSIS — J449 Chronic obstructive pulmonary disease, unspecified: Secondary | ICD-10-CM | POA: Diagnosis not present

## 2017-04-09 DIAGNOSIS — I10 Essential (primary) hypertension: Secondary | ICD-10-CM | POA: Diagnosis not present

## 2017-04-09 DIAGNOSIS — Z8249 Family history of ischemic heart disease and other diseases of the circulatory system: Secondary | ICD-10-CM | POA: Insufficient documentation

## 2017-04-09 DIAGNOSIS — R7989 Other specified abnormal findings of blood chemistry: Secondary | ICD-10-CM

## 2017-04-09 DIAGNOSIS — I4510 Unspecified right bundle-branch block: Secondary | ICD-10-CM | POA: Insufficient documentation

## 2017-04-09 DIAGNOSIS — R0602 Shortness of breath: Secondary | ICD-10-CM | POA: Diagnosis not present

## 2017-04-09 MED ORDER — TECHNETIUM TC 99M TETROFOSMIN IV KIT
30.5000 | PACK | Freq: Once | INTRAVENOUS | Status: AC | PRN
  Administered 2017-04-09: 30.5 via INTRAVENOUS
  Filled 2017-04-09: qty 31

## 2017-04-09 MED ORDER — REGADENOSON 0.4 MG/5ML IV SOLN
0.4000 mg | Freq: Once | INTRAVENOUS | Status: AC
  Administered 2017-04-09: 0.4 mg via INTRAVENOUS

## 2017-04-10 ENCOUNTER — Ambulatory Visit (HOSPITAL_COMMUNITY): Payer: Medicare Other

## 2017-04-10 ENCOUNTER — Ambulatory Visit (HOSPITAL_COMMUNITY)
Admission: RE | Admit: 2017-04-10 | Discharge: 2017-04-10 | Disposition: A | Discharge status: Home / Self Care | Payer: Medicare Other | Source: Ambulatory Visit | Attending: Cardiology | Admitting: Cardiology

## 2017-04-10 MED ORDER — TECHNETIUM TC 99M TETROFOSMIN IV KIT
31.5000 | PACK | Freq: Once | INTRAVENOUS | Status: AC | PRN
  Administered 2017-04-10: 31.5 via INTRAVENOUS

## 2017-04-11 LAB — MYOCARDIAL PERFUSION IMAGING
CHL CUP NUCLEAR SDS: 1
CHL CUP RESTING HR STRESS: 96 {beats}/min
LV dias vol: 87 mL (ref 46–106)
LV sys vol: 30 mL
NUC STRESS TID: 1.11
Peak HR: 106 {beats}/min
SRS: 5
SSS: 6

## 2017-04-21 ENCOUNTER — Encounter (HOSPITAL_COMMUNITY): Payer: Self-pay | Admitting: Emergency Medicine

## 2017-04-21 ENCOUNTER — Emergency Department (HOSPITAL_COMMUNITY)
Admission: EM | Admit: 2017-04-21 | Discharge: 2017-04-21 | Disposition: A | Discharge status: Home / Self Care | Payer: Medicare Other | Attending: Emergency Medicine | Admitting: Emergency Medicine

## 2017-04-21 DIAGNOSIS — L02212 Cutaneous abscess of back [any part, except buttock]: Secondary | ICD-10-CM | POA: Insufficient documentation

## 2017-04-21 DIAGNOSIS — J45909 Unspecified asthma, uncomplicated: Secondary | ICD-10-CM | POA: Insufficient documentation

## 2017-04-21 DIAGNOSIS — Z7902 Long term (current) use of antithrombotics/antiplatelets: Secondary | ICD-10-CM | POA: Insufficient documentation

## 2017-04-21 DIAGNOSIS — E119 Type 2 diabetes mellitus without complications: Secondary | ICD-10-CM | POA: Insufficient documentation

## 2017-04-21 DIAGNOSIS — J449 Chronic obstructive pulmonary disease, unspecified: Secondary | ICD-10-CM | POA: Insufficient documentation

## 2017-04-21 DIAGNOSIS — Z91040 Latex allergy status: Secondary | ICD-10-CM | POA: Insufficient documentation

## 2017-04-21 DIAGNOSIS — Z79899 Other long term (current) drug therapy: Secondary | ICD-10-CM | POA: Diagnosis not present

## 2017-04-21 DIAGNOSIS — Z87891 Personal history of nicotine dependence: Secondary | ICD-10-CM | POA: Diagnosis not present

## 2017-04-21 DIAGNOSIS — I10 Essential (primary) hypertension: Secondary | ICD-10-CM | POA: Diagnosis not present

## 2017-04-21 DIAGNOSIS — L03312 Cellulitis of back [any part except buttock]: Secondary | ICD-10-CM

## 2017-04-21 DIAGNOSIS — Z8673 Personal history of transient ischemic attack (TIA), and cerebral infarction without residual deficits: Secondary | ICD-10-CM | POA: Insufficient documentation

## 2017-04-21 DIAGNOSIS — L0291 Cutaneous abscess, unspecified: Secondary | ICD-10-CM

## 2017-04-21 DIAGNOSIS — R222 Localized swelling, mass and lump, trunk: Secondary | ICD-10-CM | POA: Diagnosis present

## 2017-04-21 MED ORDER — ACETAMINOPHEN 325 MG PO TABS
650.0000 mg | ORAL_TABLET | Freq: Once | ORAL | Status: AC
  Administered 2017-04-21: 650 mg via ORAL
  Filled 2017-04-21: qty 2

## 2017-04-21 MED ORDER — LIDOCAINE HCL (PF) 1 % IJ SOLN
5.0000 mL | Freq: Once | INTRAMUSCULAR | Status: AC
  Administered 2017-04-21: 5 mL
  Filled 2017-04-21: qty 5

## 2017-04-21 MED ORDER — SULFAMETHOXAZOLE-TRIMETHOPRIM 800-160 MG PO TABS: 1.0000 | ORAL_TABLET | Freq: Two times a day (BID) | ORAL | 0 refills | Status: DC

## 2017-04-21 NOTE — ED Provider Notes (Signed)
[facility] DEPT Provider Note   CSN: [id number] Arrival date & time: 04/21/17  1025  By signing my name below, I, Ephriam Jenkins, attest that this documentation has been prepared under the direction and in the presence of Ocie Cornfield PA-C.  Electronically Signed: Ephriam Jenkins, ED Scribe. 04/21/17. 11:07 AM.  History   Chief Complaint Chief Complaint  Patient presents with  . Insect Bite   HPI HPI Comments: Teresa Kerr is a 66 y.o. female with Hx of DM, who presents to the Emergency Department complaining of a worsening insect bite to her left upper back. Pt was evaluated by her PCP six days ago for this problem. She was prescribed abx ointment which she has been compliant taking as directed. However, she notes increased redness, pain and swelling to the lesion since being seen. She is unsure what may have bit her. She has applied witch hazel and used her abx ointment as directed with no relief. No drainage noted. No fever. No nausea or vomiting.  The history is provided by the patient. No language interpreter was used.   Past Medical History:  Diagnosis Date  . Asthma   . Diabetes mellitus   . Gout   . Hypertension   . Neuromuscular disorder ([place])    right sides weakness arm and leg, steroid injections to right shoulder-Dr. Beane(last 8'16)  . Stroke [facility]) 2002, 1999   pt has right sided weakness at times, gets ESI when weakness returns  . Umbilical hernia   . Uterine fibroid     Patient Active Problem List   Diagnosis Date Noted  . RBBB 01/31/2017  . Elevated troponin 01/31/2017  . Influenza 01/16/2017  . Anxiety 01/16/2017  . Chest pain 01/16/2017  . Diabetes mellitus with complication ([place])   . COPD exacerbation ([place])   . Influenza B   . Thyroid nodule   . Acute respiratory failure with hypoxia ([place])   . Pleural effusion, R, ? hemothorax 04/24/2012  . Community acquired pneumonia 04/20/2012  . Hypoxia 04/20/2012  . Shingles 04/20/2012  . Rib pain  04/20/2012  . Hypertension 04/20/2012  . History of stroke 04/20/2012  . Chronic anticoagulation 04/20/2012  . Diabetes mellitus ([place]) 04/20/2012  . FIBROIDS, UTERUS 05/16/2007  . HX, PERSONAL, TOBACCO USE 05/16/2007  . GOUT 05/14/2007  . OBESITY 05/14/2007  . Essential hypertension 05/14/2007  . ALLERGIC RHINITIS, SEASONAL 05/14/2007  . Asthma 05/14/2007    Past Surgical History:  Procedure Laterality Date  . ANGIOPLASTY     x 2 times-cerebral arterial stents- [facility]  . CYSTOSCOPY WITH RETROGRADE PYELOGRAM, URETEROSCOPY AND STENT PLACEMENT Left 09/01/2015   Procedure: CYSTOSCOPY WITH LEFT RETROGRADE, LEFT URETEROSCOPY AND STENT PLACEMENT;  Surgeon: Irine Seal, MD;  Location: [facility];  Service: Urology;  Laterality: Left;  . HAND SURGERY    . HOLMIUM LASER APPLICATION Left 85/10/7780   Procedure: WITH HOLMIUM LASER ;  Surgeon: Irine Seal, MD;  Location: [facility];  Service: Urology;  Laterality: Left;  . TOE SURGERY      OB History    Gravida Para Term Preterm AB Living   3 3 3     3    SAB TAB Ectopic Multiple Live Births                   Home Medications    Prior to Admission medications   Medication Sig Start Date End Date Taking? Authorizing Provider  acetaminophen (TYLENOL) 325 MG tablet Take 325-650 mg by mouth every 6 (six)  hours as needed for mild pain, moderate pain, fever or headache.    [provider]  allopurinol (ZYLOPRIM) 100 MG tablet Take 100 mg by mouth daily. 01/04/17   [provider]  amLODipine (NORVASC) 10 MG tablet Take 10 mg by mouth daily.      [provider]  atorvastatin (LIPITOR) 10 MG tablet Take 10 mg by mouth daily at 6 PM.  01/25/15   [provider]  azithromycin (ZITHROMAX) 250 MG tablet 250mg  po qday x 3 days 01/19/17   Jani Gravel, MD  cloNIDine (CATAPRES) 0.1 MG tablet Take 0.1 mg by mouth 2 (two) times daily. 08/22/15   [provider]  clopidogrel (PLAVIX) 75 MG tablet Take 75 mg by mouth daily.   05/01/12   [provider]  colchicine 0.6 MG tablet Take 0.6-1.2 mg by mouth See admin instructions. 1.2 mg with flares; take 0.6 mg if still present after one hour    [provider]  DEXILANT 30 MG capsule Take 30 mg by mouth daily. 11/26/16   [provider]  Fluticasone-Salmeterol (ADVAIR) 250-50 MCG/DOSE AEPB Inhale 2 puffs into the lungs daily as needed (for flares).     [provider]  furosemide (LASIX) 20 MG tablet Take 20 mg by mouth daily.     [provider]  gabapentin (NEURONTIN) 300 MG capsule Take 300 mg by mouth 2 (two) times daily.    [provider]  insulin aspart (NOVOLOG) 100 UNIT/ML injection Inject 0-15 Units into the skin 3 (three) times daily with meals. 01/18/17   Jani Gravel, MD  insulin aspart (NOVOLOG) 100 UNIT/ML injection Inject 0-5 Units into the skin at bedtime. 01/18/17   Jani Gravel, MD  insulin aspart (NOVOLOG) 100 UNIT/ML injection Inject 5 Units into the skin 3 (three) times daily before meals. 01/18/17   Jani Gravel, MD  ipratropium-albuterol (DUONEB) 0.5-2.5 (3) MG/3ML SOLN Take 3 mLs by nebulization every 4 (four) hours as needed. 01/18/17   Jani Gravel, MD  LEVEMIR FLEXTOUCH 100 UNIT/ML Pen Inject 50 Units into the skin at bedtime. 01/18/17   Jani Gravel, MD  lisinopril (PRINIVIL,ZESTRIL) 40 MG tablet Take 40 mg by mouth daily.  01/05/15   [provider]  meloxicam (MOBIC) 15 MG tablet Take 15 mg by mouth daily. 01/04/17   [provider]  ondansetron (ZOFRAN) 4 MG tablet Take 1 tablet (4 mg total) by mouth every 8 (eight) hours as needed for nausea or vomiting. 08/09/15   Hoyle Sauer, MD  oseltamivir (TAMIFLU) 75 MG capsule Take 1 capsule (75 mg total) by mouth 2 (two) times daily. 01/18/17   Jani Gravel, MD  oxyCODONE-acetaminophen (PERCOCET/ROXICET) 5-325 MG tablet Take 1 tablet by mouth every 4 (four) hours as needed for severe pain. 09/01/15   Irine Seal, MD  oxyCODONE-acetaminophen (ROXICET)  5-325 MG tablet Take 1 tablet by mouth every 12 (twelve) hours as needed for severe pain. 03/22/17   Meredith Pel, MD  predniSONE (DELTASONE) 10 MG tablet 40mg  po qday x2d then 30mg  po qday x 2d then 20mg  po qday x2d then 10mg  po qday x2d 01/18/17   Jani Gravel, MD  PROAIR RESPICLICK 956 (90 BASE) MCG/ACT AEPB Inhale 2 puffs into the lungs every 6 (six) hours as needed (shortness of breath).  10/25/14   [provider]  tiZANidine (ZANAFLEX) 2 MG tablet Take 2 mg by mouth 2 (two) times daily. 01/04/17   [provider]    Family History Family History  Problem Relation Age of Onset  . Cancer Mother        breast/ had mastecomy  . Cancer Father        colon    Social History Social History  Substance Use Topics  . Smoking status: Former Smoker    Packs/day: 1.50    Years: 34.00    Quit date: 09/24/1997  . Smokeless tobacco: Never Used  . Alcohol use No    Allergies   Latex   Review of Systems Review of Systems  Constitutional: Negative for fever.  Gastrointestinal: Negative for nausea and vomiting.  Skin: Positive for wound.    Physical Exam Updated Vital Signs BP (!) 159/68 (BP Location: Left Arm)   Pulse 93   Temp 99.3 F (37.4 C) (Oral)   Resp 19   Ht 5\' 5"  (1.651 m)   Wt 242 lb (109.8 kg)   SpO2 96%   BMI 40.27 kg/m   Physical Exam  Constitutional: She is oriented to person, place, and time. She appears well-developed and well-nourished. No distress.  HENT:  Head: Normocephalic and atraumatic.  Neck: Normal range of motion.  Pulmonary/Chest: Effort normal.  Neurological: She is alert and oriented to person, place, and time.  Skin: Skin is warm and dry. She is not diaphoretic.  4cm area of induration, erythema, warmth to the left upper back with area of ulceration but no drainage noted. I don't appreciate any fluctuance of the area.  Psychiatric: She has a normal mood and affect. Judgment normal.  Nursing note and vitals  reviewed.      ED Treatments / Results  DIAGNOSTIC STUDIES: Oxygen Saturation is 96% on RA, normal by my interpretation.  COORDINATION OF CARE: 9:51 AM-Discussed treatment plan with pt at bedside and pt agreed to plan.   Labs (all labs ordered are listed, but only abnormal results are displayed) Labs Reviewed - No data to display  EKG  EKG Interpretation None       Radiology No results found.  Procedures Procedures (including critical care time) INCISION AND DRAINAGE Performed by: Ocie Cornfield Consent: Verbal consent obtained. Risks and benefits: risks, benefits and alternatives were discussed Type: abscess  Body area: upper right back  Anesthesia: local infiltration  Incision was made with a scalpel.  Local anesthetic: lidocaine 1% Without epinephrine  Anesthetic total: 5 ml  Complexity: complex Blunt dissection to break up loculations  Drainage: purulent  Drainage amount: Copious   Packing material: 1/4 in iodoform gauze  Patient tolerance: Patient tolerated the procedure well with no immediate complications.    Medications Ordered in ED Medications  acetaminophen (TYLENOL) tablet 650 mg (not administered)  lidocaine (PF) (XYLOCAINE) 1 % injection 5 mL (5 mLs Infiltration Given 04/21/17 1027)     Initial Impression / Assessment and Plan / ED Course  I have reviewed the triage vital signs and the nursing notes.  Pertinent labs & imaging results that were available during my care of the patient were reviewed by me and considered in my medical decision making (see chart for details).     Patient with skin abscess amenable to incision and drainage.  Abscess was  large enough to warrant packing or drain,  wound recheck in 2 days. Encouraged home warm soaks and flushing. Signs of cellulitis in surrounding skin.  Will d/c to home with abx.   Pt is hemodynamically stable, in NAD, & able to ambulate in the ED. Evaluation does not show pathology  that would require ongoing emergent intervention  or inpatient treatment. I explained the diagnosis to the patient. Pain has been managed & has no complaints prior to dc. Pt is comfortable with above plan and is stable for discharge at this time. All questions were answered prior to disposition. Strict return precautions for f/u to the ED were discussed. Encouraged follow up with PCP.    Final Clinical Impressions(s) / ED Diagnoses   Final diagnoses:  Abscess  Cellulitis of back except buttock    New Prescriptions New Prescriptions   SULFAMETHOXAZOLE-TRIMETHOPRIM (BACTRIM DS,SEPTRA DS) 800-160 MG TABLET    Take 1 tablet by mouth 2 (two) times daily.  I personally performed the services described in this documentation, which was scribed in my presence. The recorded information has been reviewed and is accurate.     Doristine Devoid, PA-C 04/21/17 1123    Pattricia Boss, MD 04/21/17 1640

## 2017-04-21 NOTE — Discharge Instructions (Signed)
You have been treated for an abscess in the ED.  ° °There are sign of surrounding infection. Please take all of your antibiotics until finished!   You may develop abdominal discomfort or diarrhea from the antibiotic. You may help offset this with probiotics which you can buy or get in yogurt.  ° °May take tylenol and motrin as needed for pain. Warm compress to the area to help with the healing process. Avoid swimming for 1 week. May soak in warm water to help with pain and the healing process.  ° °Follow up with your doctor, an urgent care, or return to ED in order to remove your packing in 48-72 hours. If you do not have packing return in 48-72 hours for wound recheck. Return to the emergency department if you develop a fever, your abscess appears to become more infected (growing surrounding redness and warmth), new or worsening symptoms develop, any additional concerns.  ° °Abscess °An abscess (boil or furuncle) is an infected area that contains a collection of pus.  ° °SYMPTOMS °Signs and symptoms of an abscess include pain, tenderness, redness, or hardness. You may feel a moveable soft area under your skin. An abscess can occur anywhere in the body.  ° °TREATMENT  °A surgical cut (incision) may be made over your abscess to drain the pus. Gauze may be packed into the space or a drain may be looped through the abscess cavity (pocket). This provides a drain that will allow the cavity to heal from the inside outwards. The abscess may be painful for a few days, but should feel much better if it was drained.  °Your abscess, if seen early, may not have localized and may not have been drained. If not, another appointment may be required if it does not get better on its own or with medications. ° °HOME CARE INSTRUCTIONS  °Keep the skin and clothes clean around your abscess.  °If the abscess was drained, you will need to use gauze dressing to collect any draining pus. Dressings will typically need to be changed 3 or more  times a day.  °The infection may spread by skin contact with others. Avoid skin contact as much as possible.  °Practice good hygiene. This includes regular hand washing, cover any draining skin lesions, and do not share personal care items.  °SEEK MEDICAL CARE IF:  °You develop increased pain, swelling, redness, drainage, or bleeding in the wound site.  °You develop signs of generalized infection including muscle aches, chills, fever, or a general ill feeling.  °You have an oral temperature above 102° F (38.9° C).  °MAKE SURE YOU:  °Understand these instructions.  °Will watch your condition.  °Will get help right away if you are not doing well or get worse.  °Document Released: 06/20/2005 Document Revised: 05/23/2011 Document Reviewed: 04/13/2008 °ExitCare® Patient Information ©2012 [employer]. ° ° ° °

## 2017-04-21 NOTE — ED Triage Notes (Signed)
Pt states she got bit by something to left shoulder. Went to MD, MD told her it was okay. Increased redness and swelling noted, white head to area.

## 2017-04-25 ENCOUNTER — Ambulatory Visit (INDEPENDENT_AMBULATORY_CARE_PROVIDER_SITE_OTHER): Payer: Medicare Other | Admitting: Orthopedic Surgery

## 2017-04-26 ENCOUNTER — Encounter (HOSPITAL_COMMUNITY): Payer: Self-pay | Admitting: *Deleted

## 2017-04-26 ENCOUNTER — Emergency Department (HOSPITAL_COMMUNITY)
Admission: EM | Admit: 2017-04-26 | Discharge: 2017-04-26 | Disposition: A | Discharge status: Home / Self Care | Payer: Medicare Other | Attending: Emergency Medicine | Admitting: Emergency Medicine

## 2017-04-26 DIAGNOSIS — J449 Chronic obstructive pulmonary disease, unspecified: Secondary | ICD-10-CM | POA: Diagnosis not present

## 2017-04-26 DIAGNOSIS — I10 Essential (primary) hypertension: Secondary | ICD-10-CM | POA: Insufficient documentation

## 2017-04-26 DIAGNOSIS — Z87891 Personal history of nicotine dependence: Secondary | ICD-10-CM | POA: Insufficient documentation

## 2017-04-26 DIAGNOSIS — E119 Type 2 diabetes mellitus without complications: Secondary | ICD-10-CM | POA: Insufficient documentation

## 2017-04-26 DIAGNOSIS — Z008 Encounter for other general examination: Secondary | ICD-10-CM | POA: Insufficient documentation

## 2017-04-26 DIAGNOSIS — Z8673 Personal history of transient ischemic attack (TIA), and cerebral infarction without residual deficits: Secondary | ICD-10-CM | POA: Diagnosis not present

## 2017-04-26 DIAGNOSIS — L02212 Cutaneous abscess of back [any part, except buttock]: Secondary | ICD-10-CM | POA: Diagnosis present

## 2017-04-26 DIAGNOSIS — J45909 Unspecified asthma, uncomplicated: Secondary | ICD-10-CM | POA: Insufficient documentation

## 2017-04-26 DIAGNOSIS — Z5189 Encounter for other specified aftercare: Secondary | ICD-10-CM

## 2017-04-26 MED ORDER — LIDOCAINE HCL (PF) 1 % IJ SOLN
5.0000 mL | Freq: Once | INTRAMUSCULAR | Status: DC
  Filled 2017-04-26: qty 5

## 2017-04-26 NOTE — ED Triage Notes (Signed)
Pt was seen here on 7/29 for an abscess on her back, had it drained and was told to come back here for follow up. Reports her packing fell out, has been taking antibiotics as prescribed. No increase in redness or swelling.

## 2017-04-26 NOTE — ED Provider Notes (Signed)
Emergency Department Provider Note   I have reviewed the triage vital signs and the nursing notes.   HISTORY  Chief Complaint Follow-up and Abscess   HPI Teresa Kerr is a 66 y.o. female with PMH of DM, HTN, prior CVA, and recent I&D of abscess on the back presents to the emergency department for evaluation of wound check. The patient states that overall her symptoms are improving. She's been compliant with her antibiotics. She has been changing the dressing on her wound and feels it's getting better. The packing material fell out of the wound shortly after her incision and drainage. She denies any fevers or chills. No lesions. She does continue to have some soreness in the area who returns to the emergency department for wound evaluation. No radiation of symptoms.   Past Medical History:  Diagnosis Date  . Asthma   . Diabetes mellitus   . Gout   . Hypertension   . Neuromuscular disorder ([place])    right sides weakness arm and leg, steroid injections to right shoulder-Dr. Beane(last 8'16)  . Stroke [facility]) 2002, 1999   pt has right sided weakness at times, gets ESI when weakness returns  . Umbilical hernia   . Uterine fibroid     Patient Active Problem List   Diagnosis Date Noted  . RBBB 01/31/2017  . Elevated troponin 01/31/2017  . Influenza 01/16/2017  . Anxiety 01/16/2017  . Chest pain 01/16/2017  . Diabetes mellitus with complication ([place])   . COPD exacerbation ([place])   . Influenza B   . Thyroid nodule   . Acute respiratory failure with hypoxia ([place])   . Pleural effusion, R, ? hemothorax 04/24/2012  . Community acquired pneumonia 04/20/2012  . Hypoxia 04/20/2012  . Shingles 04/20/2012  . Rib pain 04/20/2012  . Hypertension 04/20/2012  . History of stroke 04/20/2012  . Chronic anticoagulation 04/20/2012  . Diabetes mellitus ([place]) 04/20/2012  . FIBROIDS, UTERUS 05/16/2007  . HX, PERSONAL, TOBACCO USE 05/16/2007  . GOUT 05/14/2007  . OBESITY 05/14/2007  .  Essential hypertension 05/14/2007  . ALLERGIC RHINITIS, SEASONAL 05/14/2007  . Asthma 05/14/2007    Past Surgical History:  Procedure Laterality Date  . ANGIOPLASTY     x 2 times-cerebral arterial stents- [facility]  . CYSTOSCOPY WITH RETROGRADE PYELOGRAM, URETEROSCOPY AND STENT PLACEMENT Left 09/01/2015   Procedure: CYSTOSCOPY WITH LEFT RETROGRADE, LEFT URETEROSCOPY AND STENT PLACEMENT;  Surgeon: Irine Seal, MD;  Location: [facility];  Service: Urology;  Laterality: Left;  . HAND SURGERY    . HOLMIUM LASER APPLICATION Left 01/24/9766   Procedure: WITH HOLMIUM LASER ;  Surgeon: Irine Seal, MD;  Location: [facility];  Service: Urology;  Laterality: Left;  . TOE SURGERY      Current Outpatient Rx  . Order #: [id number] Class: Historical Med  . Order #: [id number] Class: Historical Med  . Order #: [id number] Class: Historical Med  . Order #: [id number] Class: Historical Med  . Order #: [id number] Class: Normal  . Order #: [id number] Class: Historical Med  . Order #: [id number] Class: Historical Med  . Order #: [id number] Class: Historical Med  . Order #: [id number] Class: Historical Med  . Order #: [id number] Class: Historical Med  . Order #: [id number] Class: Historical Med  . Order #: [id number] Class: Historical Med  . Order #: [id number] Class: Normal  . Order #: [id number] Class: Normal  . Order #: [id number] Class: Normal  . Order #: [id number] Class: Normal  . Order #: [id number] Class: Normal  . Order #: [id number] Class: Historical Med  . Order #: [id number] Class: Historical Med  .  Order #: [id number] Class: Print  . Order #: [id number] Class: Normal  . Order #: [id number] Class: Print  . Order #: [id number] Class: Print  . Order #: [id number] Class: Normal  . Order #: [id number] Class: Historical Med  . Order #: [id number] Class: Print  . Order #: [id number] Class: Historical Med    Allergies Latex  Family History  Problem Relation Age of Onset  . Cancer Mother        breast/ had mastecomy  . Cancer Father        colon     Social History Social History  Substance Use Topics  . Smoking status: Former Smoker    Packs/day: 1.50    Years: 34.00    Quit date: 09/24/1997  . Smokeless tobacco: Never Used  . Alcohol use No    Review of Systems  Constitutional: No fever/chills Eyes: No visual changes. ENT: No sore throat. Cardiovascular: Denies chest pain. Respiratory: Denies shortness of breath. Gastrointestinal: No abdominal pain.  No nausea, no vomiting.  No diarrhea.  No constipation. Genitourinary: Negative for dysuria. Musculoskeletal: Negative for back pain. Skin: Negative for rash. Positive back abscess s/p I&D.  Neurological: Negative for headaches, focal weakness or numbness.  10-point ROS otherwise negative.  ____________________________________________   PHYSICAL EXAM:  VITAL SIGNS: ED Triage Vitals  Enc Vitals Group     BP 04/26/17 0810 (!) 123/91     Pulse Rate 04/26/17 0810 95     Resp 04/26/17 0810 17     Temp 04/26/17 0810 97.8 F (36.6 C)     Temp Source 04/26/17 0810 Oral     SpO2 04/26/17 0810 98 %     Weight 04/26/17 0810 245 lb (111.1 kg)     Height 04/26/17 0810 5\' 5"  (1.651 m)     Pain Score 04/26/17 0808 3   Constitutional: Alert and oriented. Well appearing and in no acute distress. Eyes: Conjunctivae are normal.  Head: Atraumatic. Nose: No congestion/rhinnorhea. Mouth/Throat: Mucous membranes are moist.  Oropharynx non-erythematous. Neck: No stridor Cardiovascular: Normal rate, regular rhythm. Good peripheral circulation. Grossly normal heart sounds.   Respiratory: Normal respiratory effort.  No retractions. Lungs CTAB. Gastrointestinal: Soft and nontender. No distention.  Musculoskeletal: No lower extremity tenderness nor edema. No gross deformities of extremities. Neurologic:  Normal speech and language. No gross focal neurologic deficits are appreciated.  Skin:  Skin is warm and dry. Small area of erythema surrounding a 0.25    ____________________________________________   PROCEDURES  Procedure(s) performed:   Marland KitchenMarland KitchenIncision and Drainage Date/Time: 04/26/2017 9:08 AM Performed by: Pamella Samons G Authorized by: Margette Fast   Consent:    Consent obtained:  Verbal   Consent given by:  Patient   Risks discussed:  Bleeding, incomplete drainage, pain, infection and damage to other organs   Alternatives discussed:  No treatment Location:    Type:  Abscess   Size:  2 cm   Location:  Trunk   Trunk location:  Back Pre-procedure details:    Skin preparation:  Betadine Anesthesia (see MAR for exact dosages):    Anesthesia method:  Local infiltration   Local anesthetic:  Lidocaine 1% w/o epi Procedure type:    Complexity:  Simple Procedure details:    Incision types:  Single straight   Incision depth:  Dermal   Scalpel blade:  11   Wound management:  Probed and deloculated   Drainage:  Purulent   Drainage amount:  Scant   Wound treatment:  Wound left open   Packing materials:  None  Post-procedure details:    Patient tolerance of procedure:  Tolerated well, no immediate complications   ____________________________________________   INITIAL IMPRESSION / ASSESSMENT AND PLAN / ED COURSE  Pertinent labs & imaging results that were available during my care of the patient were reviewed by me and considered in my medical decision making (see chart for details).  Patient presents to the emergency department for evaluation of abscess after recent I&D. She has been on antibiotics and has 2 more days remaining. The wound is well appearing and overall. She does have some slight fluctuance appreciated to the medial/inferior wound edge with some mild erythema. There is some scant purulent discharge coming from the prior I&D site. I elected to extend the incision into the area of fluctuance which is documented above. I expressed additional purulent material and left the wound open for further drainage. She will  continue with antibiotics and follow up with her primary care physician for further wound checks. Discussed return precautions in detail.  At this time, I do not feel there is any life-threatening condition present. I have reviewed and discussed all results (EKG, imaging, lab, urine as appropriate), exam findings with patient. I have reviewed nursing notes and appropriate previous records.  I feel the patient is safe to be discharged home without further emergent workup. Discussed usual and customary return precautions. Patient and family (if present) verbalize understanding and are comfortable with this plan.  Patient will follow-up with their primary care provider. If they do not have a primary care provider, information for follow-up has been provided to them. All questions have been answered.  ____________________________________________  FINAL CLINICAL IMPRESSION(S) / ED DIAGNOSES  Final diagnoses:  Wound check, abscess     MEDICATIONS GIVEN DURING THIS VISIT:  Medications  lidocaine (PF) (XYLOCAINE) 1 % injection 5 mL (not administered)     NEW OUTPATIENT MEDICATIONS STARTED DURING THIS VISIT:  None   Note:  This document was prepared using Dragon voice recognition software and may include unintentional dictation errors.  Nanda Quinton, MD Emergency Medicine    Haddie Bruhl, [facility], MD 04/26/17 [id number]

## 2017-04-26 NOTE — Discharge Instructions (Signed)
You were evaluated in the ED for abscess. The wound is looking well but we extended the incision slightly to help with further drainage. Keep the area covered and clean with soap and water as needed. Continue taking your antibiotics and follow up with your PCP for further wound checks.   Return to the ED if the area becomes suddenly more painful, red, or drainage increases. Also return if you develop fever and chills. There may be some oozing blood from the wound since we made an additional cut today.

## 2017-09-21 ENCOUNTER — Other Ambulatory Visit: Payer: Self-pay

## 2017-09-21 ENCOUNTER — Encounter (HOSPITAL_COMMUNITY): Payer: Self-pay | Admitting: *Deleted

## 2017-09-21 ENCOUNTER — Ambulatory Visit (HOSPITAL_COMMUNITY)
Admission: EM | Admit: 2017-09-21 | Discharge: 2017-09-21 | Disposition: A | Discharge status: Home / Self Care | Payer: Medicare Other | Attending: Internal Medicine | Admitting: Internal Medicine

## 2017-09-21 DIAGNOSIS — M7061 Trochanteric bursitis, right hip: Secondary | ICD-10-CM

## 2017-09-21 DIAGNOSIS — L03213 Periorbital cellulitis: Secondary | ICD-10-CM

## 2017-09-21 MED ORDER — TETRACAINE HCL 0.5 % OP SOLN
OPHTHALMIC | Status: AC
  Filled 2017-09-21: qty 4

## 2017-09-21 MED ORDER — CLINDAMYCIN HCL 150 MG PO CAPS: 300.0000 mg | ORAL_CAPSULE | Freq: Three times a day (TID) | ORAL | 0 refills | Status: AC

## 2017-09-21 MED ORDER — TRAMADOL HCL 50 MG PO TABS: 50.0000 mg | ORAL_TABLET | Freq: Four times a day (QID) | ORAL | 0 refills | Status: DC | PRN

## 2017-09-21 MED ORDER — AMOXICILLIN 875 MG PO TABS: 875.0000 mg | ORAL_TABLET | Freq: Two times a day (BID) | ORAL | 0 refills | Status: DC

## 2017-09-21 NOTE — ED Provider Notes (Signed)
[facility]    CSN: [id number] Arrival date & time: 09/21/17  1301     History   Chief Complaint Chief Complaint  Patient presents with  . Eye Problem  . Leg Pain    HPI Teresa Kerr is a 66 y.o. female presents to the urgent care facility for evaluation of left eye, swelling and redness.  Swelling and redness has been present for 6 days.  She denies any fevers, vision changes or matting of the eyelids.  She does describe redness and puffiness along the upper and lower eyelid.  She has no pain with extraocular movement of the left eye.  She has been using topical creams with no improvement.  She has not been on any oral antibiotics.  Patient denies any left eye vision changes.  Patient also complains of 2 months of right hip pain along the trochanteric bursa.  No numbness tingling or burning.  She denies any trauma or injury.  She had x-rays of the pelvis and left hip performed in October that showed no acute arthropathy or significant arthritis.  She is scheduled to see orthopedist.  She is ambulatory with a cane.  HPI  Past Medical History:  Diagnosis Date  . Asthma   . Diabetes mellitus   . Gout   . Hypertension   . Neuromuscular disorder ([place])    right sides weakness arm and leg, steroid injections to right shoulder-Dr. Beane(last 8'16)  . Stroke [facility]) 2002, 1999   pt has right sided weakness at times, gets ESI when weakness returns  . Umbilical hernia   . Uterine fibroid     Patient Active Problem List   Diagnosis Date Noted  . RBBB 01/31/2017  . Elevated troponin 01/31/2017  . Influenza 01/16/2017  . Anxiety 01/16/2017  . Chest pain 01/16/2017  . Diabetes mellitus with complication ([place])   . COPD exacerbation ([place])   . Influenza B   . Thyroid nodule   . Acute respiratory failure with hypoxia ([place])   . Pleural effusion, R, ? hemothorax 04/24/2012  . Community acquired pneumonia 04/20/2012  . Hypoxia 04/20/2012  . Shingles 04/20/2012  . Rib  pain 04/20/2012  . Hypertension 04/20/2012  . History of stroke 04/20/2012  . Chronic anticoagulation 04/20/2012  . Diabetes mellitus ([place]) 04/20/2012  . FIBROIDS, UTERUS 05/16/2007  . HX, PERSONAL, TOBACCO USE 05/16/2007  . GOUT 05/14/2007  . OBESITY 05/14/2007  . Essential hypertension 05/14/2007  . ALLERGIC RHINITIS, SEASONAL 05/14/2007  . Asthma 05/14/2007    Past Surgical History:  Procedure Laterality Date  . ANGIOPLASTY     x 2 times-cerebral arterial stents- [facility]  . CYSTOSCOPY WITH RETROGRADE PYELOGRAM, URETEROSCOPY AND STENT PLACEMENT Left 09/01/2015   Procedure: CYSTOSCOPY WITH LEFT RETROGRADE, LEFT URETEROSCOPY AND STENT PLACEMENT;  Surgeon: Irine Seal, MD;  Location: [facility];  Service: Urology;  Laterality: Left;  . HAND SURGERY    . HOLMIUM LASER APPLICATION Left 50/10/7739   Procedure: WITH HOLMIUM LASER ;  Surgeon: Irine Seal, MD;  Location: [facility];  Service: Urology;  Laterality: Left;  . TOE SURGERY      OB History    Gravida Para Term Preterm AB Living   3 3 3     3    SAB TAB Ectopic Multiple Live Births                   Home Medications    Prior to Admission medications   Medication Sig Start Date End Date Taking?  Authorizing Provider  amLODipine (NORVASC) 10 MG tablet Take 10 mg by mouth daily.     Yes [provider]  atorvastatin (LIPITOR) 10 MG tablet Take 10 mg by mouth daily at 6 PM.  01/25/15  Yes [provider]  cloNIDine (CATAPRES) 0.1 MG tablet Take 0.1 mg by mouth 2 (two) times daily. 08/22/15  Yes [provider]  clopidogrel (PLAVIX) 75 MG tablet Take 75 mg by mouth daily.  05/01/12  Yes [provider]  colchicine 0.6 MG tablet Take 0.6-1.2 mg by mouth See admin instructions. 1.2 mg with flares; take 0.6 mg if still present after one hour   Yes [provider]  Fluticasone-Salmeterol (ADVAIR) 250-50 MCG/DOSE AEPB Inhale 2 puffs into the lungs daily as needed (for flares).    Yes [provider]  furosemide (LASIX) 20 MG tablet Take 20 mg by mouth daily.    Yes [provider]  gabapentin (NEURONTIN) 300 MG capsule Take 300 mg by mouth 2 (two) times daily.   Yes [provider]  LEVEMIR FLEXTOUCH 100 UNIT/ML Pen Inject 50 Units into the skin at bedtime. Patient taking differently: Inject into the skin at bedtime. Per pt: 54u at night, and 8u q AM 01/18/17  Yes Jani Gravel, MD  lisinopril (PRINIVIL,ZESTRIL) 40 MG tablet Take 40 mg by mouth daily.  01/05/15  Yes [provider]  oxyCODONE-acetaminophen (PERCOCET/ROXICET) 5-325 MG tablet Take 1 tablet by mouth every 4 (four) hours as needed for severe pain. 09/01/15  Yes Irine Seal, MD  acetaminophen (TYLENOL) 325 MG tablet Take 325-650 mg by mouth every 6 (six) hours as needed for mild pain, moderate pain, fever or headache.    [provider]  allopurinol (ZYLOPRIM) 100 MG tablet Take 100 mg by mouth daily. 01/04/17   [provider]  amoxicillin (AMOXIL) 875 MG tablet Take 1 tablet (875 mg total) by mouth 2 (two) times daily. X 10 days 09/21/17   Duanne Guess, PA-C  azithromycin [facility]) 250 MG tablet 250mg  po qday x 3 days 01/19/17   Jani Gravel, MD  clindamycin (CLEOCIN) 150 MG capsule Take 2 capsules (300 mg total) by mouth 3 (three) times daily for 10 days. 09/21/17 10/01/17  Duanne Guess, PA-C  DEXILANT 30 MG capsule Take 30 mg by mouth daily. 11/26/16   [provider]  insulin aspart (NOVOLOG) 100 UNIT/ML injection Inject 0-15 Units into the skin 3 (three) times daily with meals. 01/18/17   Jani Gravel, MD  insulin aspart (NOVOLOG) 100 UNIT/ML injection Inject 0-5 Units into the skin at bedtime. 01/18/17   Jani Gravel, MD  insulin aspart (NOVOLOG) 100 UNIT/ML injection Inject 5 Units into the skin 3 (three) times daily before meals. 01/18/17   Jani Gravel, MD  ipratropium-albuterol (DUONEB) 0.5-2.5 (3) MG/3ML SOLN Take 3 mLs by nebulization every 4 (four) hours as needed.  01/18/17   Jani Gravel, MD  meloxicam (MOBIC) 15 MG tablet Take 15 mg by mouth daily. 01/04/17   [provider]  ondansetron (ZOFRAN) 4 MG tablet Take 1 tablet (4 mg total) by mouth every 8 (eight) hours as needed for nausea or vomiting. 08/09/15   Hoyle Sauer, MD  oseltamivir (TAMIFLU) 75 MG capsule Take 1 capsule (75 mg total) by mouth 2 (two) times daily. 01/18/17   Jani Gravel, MD  oxyCODONE-acetaminophen (ROXICET) 5-325 MG tablet Take 1 tablet by mouth every 12 (twelve) hours as needed for severe pain. 03/22/17   Meredith Pel, MD  predniSONE (DELTASONE) 10 MG tablet 40mg  po qday x2d then 30mg  po qday x 2d then 20mg  po qday x2d then 10mg  po qday x2d 01/18/17   Jani Gravel, MD  PROAIR RESPICLICK 409 (90 BASE) MCG/ACT AEPB Inhale 2 puffs into the lungs every 6 (six) hours as needed (shortness of breath).  10/25/14   [provider]  sulfamethoxazole-trimethoprim (BACTRIM DS,SEPTRA DS) 800-160 MG tablet Take 1 tablet by mouth 2 (two) times daily. 04/21/17   Doristine Devoid, PA-C  tiZANidine (ZANAFLEX) 2 MG tablet Take 2 mg by mouth 2 (two) times daily. 01/04/17   [provider]  traMADol (ULTRAM) 50 MG tablet Take 1 tablet (50 mg total) by mouth every 6 (six) hours as needed. 09/21/17   Duanne Guess, PA-C    Family History Family History  Problem Relation Age of Onset  . Cancer Mother        breast/ had mastecomy  . Cancer Father        colon    Social History Social History   Tobacco Use  . Smoking status: Former Smoker    Packs/day: 1.50    Years: 34.00    Pack years: 51.00    Last attempt to quit: 09/24/1997    Years since quitting: 20.0  . Smokeless tobacco: Never Used  Substance Use Topics  . Alcohol use: No  . Drug use: No     Allergies   Latex   Review of Systems Review of Systems  Constitutional: Negative for activity change, chills, fatigue and fever.  HENT: Negative for congestion, sinus pressure and sore throat.   Eyes:  Positive for redness. Negative for photophobia, pain, discharge and visual disturbance.  Respiratory: Negative for cough, chest tightness and shortness of breath.   Cardiovascular: Negative for chest pain and leg swelling.  Gastrointestinal: Negative for abdominal pain, diarrhea, nausea and vomiting.  Genitourinary: Negative for dysuria.  Musculoskeletal: Positive for arthralgias. Negative for gait problem.  Skin: Negative for rash.  Neurological: Negative for weakness, numbness and headaches.  Hematological: Negative for adenopathy.  Psychiatric/Behavioral: Negative for agitation, behavioral problems and confusion.     Physical Exam Triage Vital Signs ED Triage Vitals  Enc Vitals Group     BP 09/21/17 1459 (!) 187/98     Pulse Rate 09/21/17 1459 (!) 101     Resp 09/21/17 1459 18     Temp 09/21/17 1459 98.3 F (36.8 C)     Temp Source 09/21/17 1459 Oral     SpO2 09/21/17 1459 95 %     Weight --      Height --      Head Circumference --      Peak Flow --      Pain Score 09/21/17 1510 8     Pain Loc --      Pain Edu? --      Excl. in [place]? --    No data found.  Updated Vital Signs BP (!) 187/98 (BP Location: Left Arm) Comment: Pt sts that she has not taken either BP medication today, notified RN  Pulse (!) 101 Comment: notified RN  Temp 98.3 F (36.8 C) (Oral)   Resp 18   SpO2 95%   Visual Acuity Right Eye Distance: 20/50 uncorrected Left Eye Distance: 20/50 uncorrected Bilateral Distance: 20/50 uncorrected  Right Eye Near:   Left Eye Near:    Bilateral Near:     Physical Exam  Constitutional: She is oriented to person, place, and time. She  appears well-developed and well-nourished. No distress.  HENT:  Head: Normocephalic and atraumatic.  Mouth/Throat: Oropharynx is clear and moist.  Examination of the left eye shows patient has no periorbital swelling, edema.  Conjunctiva is normal with no erythema.  There is no signs of hyphema.  Pupils equal round reactive to  light.  Extraocular movement is within normal limits.  No pain with extraocular movement of the left eye.  There is erythema and mild swelling along the upper and lower eyelid with no fluctuance or drainage.  Eyes: EOM are normal. Pupils are equal, round, and reactive to light. Right eye exhibits no discharge. Left eye exhibits no discharge.  Neck: Normal range of motion. Neck supple.  Cardiovascular: Normal rate, regular rhythm and intact distal pulses.  Pulmonary/Chest: Effort normal and breath sounds normal. No respiratory distress. She exhibits no tenderness.  Musculoskeletal: Normal range of motion. She exhibits no edema.  Examination of the right hip and lower extremity shows no swelling or edema.  She has full internal and external rotation of the right hip with no discomfort.  She is point tender along the trochanteric bursa.  She is nontender along the iliac crest, SI joints or sacrum.  She is able to stand with no significant trouble or discomfort.  Lymphadenopathy:    She has no cervical adenopathy.  Neurological: She is alert and oriented to person, place, and time. She has normal reflexes.  Skin: Skin is warm and dry.  Psychiatric: She has a normal mood and affect. Her behavior is normal. Judgment and thought content normal.     UC Treatments / Results  Labs (all labs ordered are listed, but only abnormal results are displayed) Labs Reviewed - No data to display  EKG  EKG Interpretation None       Radiology No results found.  Procedures Procedures (including critical care time)  Medications Ordered in UC Medications - No data to display   Initial Impression / Assessment and Plan / UC Course  I have reviewed the triage vital signs and the nursing notes.  Pertinent labs & imaging results that were available during my care of the patient were reviewed by me and considered in my medical decision making (see chart for details).     66 year old female with left eye  preseptal cellulitis with no sign of orbital cellulitis.  Vital signs within normal limits, she is afebrile.  She is started on clindamycin and amoxicillin.  She also complaining of right hip pain, physical exam and history consistent with trochanteric bursitis.  Recommend she continue with Tylenol, heating pad and tramadol given for moderate pain.  She will follow with orthopedics.  Final Clinical Impressions(s) / UC Diagnoses   Final diagnoses:  Preseptal cellulitis of left eye  Trochanteric bursitis of right hip    ED Discharge Orders        Ordered    clindamycin (CLEOCIN) 150 MG capsule  3 times daily     09/21/17 1537    amoxicillin (AMOXIL) 875 MG tablet  2 times daily     09/21/17 1537    traMADol (ULTRAM) 50 MG tablet  Every 6 hours PRN     09/21/17 1537        Duanne Guess, [place] 09/21/17 1542

## 2017-09-21 NOTE — ED Triage Notes (Signed)
C/O left eye irritation, redness, tenderness x 5 days.  Has some blurred vision on occasion.  Has been using OTC stye cream and has tried warm compresses without relief.  Also c/o right upper leg pain without injury x approx 1 wk.  C/O pain to right groin with tenderness extending to right knee.

## 2017-09-21 NOTE — Discharge Instructions (Signed)
Please take antibiotics as prescribed.  Please drink lots of fluids.  Apply heating pad to the right hip and take tramadol as needed for moderate pain.  He may also take Tylenol as needed for mild pain of the right hip.  If any fevers, decrease in vision, increase in left eye redness, swelling or pain with eye movement, return to the clinic or emergency department immediately.

## 2017-10-30 ENCOUNTER — Encounter (HOSPITAL_COMMUNITY): Payer: Self-pay | Admitting: *Deleted

## 2017-10-30 ENCOUNTER — Ambulatory Visit (HOSPITAL_COMMUNITY)
Admission: EM | Admit: 2017-10-30 | Discharge: 2017-10-30 | Disposition: A | Discharge status: Home / Self Care | Payer: Medicare Other | Attending: Emergency Medicine | Admitting: Emergency Medicine

## 2017-10-30 DIAGNOSIS — H60502 Unspecified acute noninfective otitis externa, left ear: Secondary | ICD-10-CM

## 2017-10-30 DIAGNOSIS — L0390 Cellulitis, unspecified: Secondary | ICD-10-CM

## 2017-10-30 MED ORDER — NEOMYCIN-POLYMYXIN-HC 3.5-10000-1 OT SUSP
4.0000 [drp] | Freq: Four times a day (QID) | OTIC | Status: DC
Start: 2017-10-30 — End: 2017-10-30
  Administered 2017-10-30: 4 [drp] via OTIC

## 2017-10-30 MED ORDER — DOXYCYCLINE HYCLATE 100 MG PO CAPS: 100.0000 mg | ORAL_CAPSULE | Freq: Two times a day (BID) | ORAL | 0 refills | Status: AC

## 2017-10-30 NOTE — ED Provider Notes (Signed)
HPI  SUBJECTIVE:  Teresa Kerr is a 67 y.o. female who presents with 6 days of constant throbbing, sore left ear pain and swelling, nasal congestion, rhinorrhea.  She denies fevers, facial rash, sore throat.  No recent swimming.  She uses Q-tips in her ear.  No change in hearing.  No antibiotics in the past month or antipyretic in the past 6-8 hours.  States that she had similar symptoms or was treated with oral antibiotics.  She has tried sweet oil, using a Q-tip, which hazel and Tylenol without improvement in her symptoms.  Symptoms are worse with lying on the left side, chewing, opening her mouth widely.  She has a past medical history of hypertension, stroke for which she takes Plavix.  Also diabetes, COPD, she is a former smoker.  History of temporomandibular joint arthralgias, MRSA.  PMD: Dr. Butch Penny at the [facility] clinic.    Past Medical History:  Diagnosis Date  . Asthma   . Diabetes mellitus   . Gout   . Hypertension   . Neuromuscular disorder ([place])    right sides weakness arm and leg, steroid injections to right shoulder-Dr. Beane(last 8'16)  . Stroke [facility]) 2002, 1999   pt has right sided weakness at times, gets ESI when weakness returns  . Umbilical hernia   . Uterine fibroid     Past Surgical History:  Procedure Laterality Date  . ANGIOPLASTY     x 2 times-cerebral arterial stents- [facility]  . CYSTOSCOPY WITH RETROGRADE PYELOGRAM, URETEROSCOPY AND STENT PLACEMENT Left 09/01/2015   Procedure: CYSTOSCOPY WITH LEFT RETROGRADE, LEFT URETEROSCOPY AND STENT PLACEMENT;  Surgeon: Irine Seal, MD;  Location: [facility];  Service: Urology;  Laterality: Left;  . HAND SURGERY    . HOLMIUM LASER APPLICATION Left 51/0/2585   Procedure: WITH HOLMIUM LASER ;  Surgeon: Irine Seal, MD;  Location: [facility];  Service: Urology;  Laterality: Left;  . TOE SURGERY      Family History  Problem Relation Age of Onset  . Cancer Mother        breast/ had mastecomy  . Cancer Father         colon    Social History   Tobacco Use  . Smoking status: Former Smoker    Packs/day: 1.50    Years: 34.00    Pack years: 51.00    Last attempt to quit: 09/24/1997    Years since quitting: 20.1  . Smokeless tobacco: Never Used  Substance Use Topics  . Alcohol use: No  . Drug use: No    No current facility-administered medications for this encounter.   Current Outpatient Medications:  .  amLODipine (NORVASC) 10 MG tablet, Take 10 mg by mouth daily.  , Disp: , Rfl:  .  cloNIDine (CATAPRES) 0.1 MG tablet, Take 0.1 mg by mouth 2 (two) times daily., Disp: , Rfl:  .  clopidogrel (PLAVIX) 75 MG tablet, Take 75 mg by mouth daily. , Disp: , Rfl:  .  colchicine 0.6 MG tablet, Take 0.6-1.2 mg by mouth See admin instructions. 1.2 mg with flares; take 0.6 mg if still present after one hour, Disp: , Rfl:  .  DEXILANT 30 MG capsule, Take 30 mg by mouth daily., Disp: , Rfl:  .  furosemide (LASIX) 20 MG tablet, Take 20 mg by mouth daily. , Disp: , Rfl:  .  gabapentin (NEURONTIN) 300 MG capsule, Take 300 mg by mouth 2 (two) times daily., Disp: , Rfl:  .  ipratropium-albuterol (DUONEB) 0.5-2.5 (  3) MG/3ML SOLN, Take 3 mLs by nebulization every 4 (four) hours as needed., Disp: 360 mL, Rfl: 0 .  LEVEMIR FLEXTOUCH 100 UNIT/ML Pen, Inject 50 Units into the skin at bedtime. (Patient taking differently: Inject into the skin at bedtime. Per pt: 54u at night, and 8u q AM), Disp: 15 mL, Rfl: 1 .  lisinopril (PRINIVIL,ZESTRIL) 40 MG tablet, Take 40 mg by mouth daily. , Disp: , Rfl: 3 .  meloxicam (MOBIC) 15 MG tablet, Take 15 mg by mouth daily., Disp: , Rfl:  .  tiZANidine (ZANAFLEX) 2 MG tablet, Take 2 mg by mouth 2 (two) times daily., Disp: , Rfl:  .  traMADol (ULTRAM) 50 MG tablet, Take 1 tablet (50 mg total) by mouth every 6 (six) hours as needed., Disp: 20 tablet, Rfl: 0 .  acetaminophen (TYLENOL) 325 MG tablet, Take 325-650 mg by mouth every 6 (six) hours as needed for mild pain, moderate pain, fever or  headache., Disp: , Rfl:  .  allopurinol (ZYLOPRIM) 100 MG tablet, Take 100 mg by mouth daily., Disp: , Rfl:  .  atorvastatin (LIPITOR) 10 MG tablet, Take 10 mg by mouth daily at 6 PM. , Disp: , Rfl: 3 .  doxycycline (VIBRAMYCIN) 100 MG capsule, Take 1 capsule (100 mg total) by mouth 2 (two) times daily for 5 days., Disp: 10 capsule, Rfl: 0 .  Fluticasone-Salmeterol (ADVAIR) 250-50 MCG/DOSE AEPB, Inhale 2 puffs into the lungs daily as needed (for flares). , Disp: , Rfl:  .  insulin aspart (NOVOLOG) 100 UNIT/ML injection, Inject 0-15 Units into the skin 3 (three) times daily with meals., Disp: 10 mL, Rfl: 11 .  insulin aspart (NOVOLOG) 100 UNIT/ML injection, Inject 0-5 Units into the skin at bedtime., Disp: 10 mL, Rfl: 11 .  insulin aspart (NOVOLOG) 100 UNIT/ML injection, Inject 5 Units into the skin 3 (three) times daily before meals., Disp: 10 mL, Rfl: 11 .  ondansetron (ZOFRAN) 4 MG tablet, Take 1 tablet (4 mg total) by mouth every 8 (eight) hours as needed for nausea or vomiting., Disp: 20 tablet, Rfl: 0 .  PROAIR RESPICLICK 517 (90 BASE) MCG/ACT AEPB, Inhale 2 puffs into the lungs every 6 (six) hours as needed (shortness of breath). , Disp: , Rfl: 3  Allergies  Allergen Reactions  . Latex Itching and Rash    RE: "Powdered gloves"      ROS  As noted in HPI.   Physical Exam  BP (!) 190/85 (BP Location: Left Arm) Comment: Notified Melissa, Pt sts that she has not taken BP medication yet today  Pulse 88   Temp 97.7 F (36.5 C) (Oral)   Resp 18   SpO2 96%   Constitutional: Well developed, well nourished, no acute distress Eyes:  EOMI, conjunctiva normal bilaterally HENT: Normocephalic, atraumatic,mucus membranes moist.  Left external ear canal extensively swollen unable to visualize TM.  Positive pain with traction on the pinna, pain with palpation of tragus.  Positive erythema posterior to the ear.  No erythema or tenderness over the mastoid. Neck: No cervical  lymphadenopathy Respiratory: Normal inspiratory effort Cardiovascular: Normal rate GI: nondistended skin: No facial rash, skin intact Musculoskeletal: no deformities Neurologic: Alert & oriented x 3, no focal neuro deficits Psychiatric: Speech and behavior appropriate   ED Course   Medications - No data to display  No orders of the defined types were placed in this encounter.   No results found for this or any previous visit (from the past 24 hour(s)). No results  found.  ED Clinical Impression  Acute otitis externa of left ear, unspecified type  Cellulitis, unspecified cellulitis site   ED Assessment/Plan  Placed an ear wick and Cortisporin eardrops. Sending her home with this. Patient to do this every the 6 hours.  We will also send home with doxycycline as she has a cellulitis.  She will return here or follow-up with her primary care physician in 2 or 3 days for ear wick removal and reevaluation.  She will go to the ER if she gets worse.  Meds ordered this encounter  Medications  . DISCONTD: neomycin-polymyxin-hydrocortisone (CORTISPORIN) OTIC (EAR) suspension 4 drop  . doxycycline (VIBRAMYCIN) 100 MG capsule    Sig: Take 1 capsule (100 mg total) by mouth 2 (two) times daily for 5 days.    Dispense:  10 capsule    Refill:  0    *This clinic note was created using [employer]. Therefore, there may be occasional mistakes despite careful proofreading.   ?   Melynda Ripple, MD 10/31/17 1145

## 2017-10-30 NOTE — ED Triage Notes (Signed)
Patient reports left ear pain x over week. Called pcp and was unable to get an appointment. States has hx of same and needed antibiotic.

## 2017-10-30 NOTE — Discharge Instructions (Signed)
Use the antibiotic eardrops every 6 hours.  Also finish the doxycycline.  Return here or see your doctor in 2 or 3 days for ear wick removal and reevaluation.  Go to the ER if you get worse.

## 2017-11-21 ENCOUNTER — Telehealth (HOSPITAL_COMMUNITY): Payer: Self-pay

## 2017-11-21 NOTE — Telephone Encounter (Signed)
Called to schedule f/u mri, no answer, left vm. AW 

## 2017-12-03 ENCOUNTER — Other Ambulatory Visit (HOSPITAL_COMMUNITY): Payer: Self-pay | Admitting: Interventional Radiology

## 2017-12-03 DIAGNOSIS — I771 Stricture of artery: Secondary | ICD-10-CM

## 2017-12-04 ENCOUNTER — Other Ambulatory Visit: Payer: Self-pay | Admitting: Student

## 2017-12-04 ENCOUNTER — Telehealth: Payer: Self-pay | Admitting: Student

## 2017-12-04 NOTE — Telephone Encounter (Signed)
Called in prescription for Ativan for patient to take prior MRI on 12/20/17.   LVM for patient notifying of status.   Brynda Greathouse, MS RD PA-C 2:12 PM

## 2017-12-20 ENCOUNTER — Ambulatory Visit (HOSPITAL_COMMUNITY): Payer: Medicare Other

## 2017-12-27 ENCOUNTER — Ambulatory Visit (HOSPITAL_COMMUNITY): Payer: Medicare Other

## 2018-01-03 ENCOUNTER — Other Ambulatory Visit (HOSPITAL_COMMUNITY): Payer: Medicare Other

## 2018-01-11 LAB — GLUCOSE, POCT (MANUAL RESULT ENTRY): POC GLUCOSE: 361 mg/dL — AB (ref 70–99)

## 2018-01-15 ENCOUNTER — Ambulatory Visit (HOSPITAL_COMMUNITY)
Admission: RE | Admit: 2018-01-15 | Discharge: 2018-01-15 | Disposition: A | Discharge status: Home / Self Care | Payer: Medicare Other | Source: Ambulatory Visit | Attending: Interventional Radiology | Admitting: Interventional Radiology

## 2018-01-15 ENCOUNTER — Ambulatory Visit (HOSPITAL_COMMUNITY): Admission: RE | Admit: 2018-01-15 | Payer: Medicare Other | Source: Ambulatory Visit

## 2018-01-15 ENCOUNTER — Encounter (HOSPITAL_COMMUNITY): Payer: Self-pay

## 2018-01-15 DIAGNOSIS — I6601 Occlusion and stenosis of right middle cerebral artery: Secondary | ICD-10-CM | POA: Diagnosis not present

## 2018-01-15 DIAGNOSIS — I658 Occlusion and stenosis of other precerebral arteries: Secondary | ICD-10-CM | POA: Insufficient documentation

## 2018-01-15 DIAGNOSIS — G319 Degenerative disease of nervous system, unspecified: Secondary | ICD-10-CM | POA: Insufficient documentation

## 2018-01-15 DIAGNOSIS — I771 Stricture of artery: Secondary | ICD-10-CM | POA: Diagnosis not present

## 2018-01-15 DIAGNOSIS — I6782 Cerebral ischemia: Secondary | ICD-10-CM | POA: Diagnosis not present

## 2018-01-15 LAB — CREATININE, SERUM
Creatinine, Ser: 0.97 mg/dL (ref 0.44–1.00)
GFR calc non Af Amer: >60 mL/min (ref >60)

## 2018-01-15 MED ORDER — LORAZEPAM 2 MG/ML IJ SOLN
0.5000 mg | Freq: Once | INTRAMUSCULAR | Status: AC
  Administered 2018-01-15: 0.5 mg via INTRAVENOUS
  Filled 2018-01-15: qty 0.25

## 2018-01-15 MED ORDER — LORAZEPAM 2 MG/ML IJ SOLN
INTRAMUSCULAR | Status: AC
  Filled 2018-01-15: qty 1

## 2018-01-15 MED ORDER — GADOBENATE DIMEGLUMINE 529 MG/ML IV SOLN
20.0000 mL | Freq: Once | INTRAVENOUS | Status: AC
  Administered 2018-01-15: 20 mL via INTRAVENOUS

## 2018-01-31 ENCOUNTER — Ambulatory Visit (HOSPITAL_COMMUNITY)
Admission: EM | Admit: 2018-01-31 | Discharge: 2018-01-31 | Disposition: A | Discharge status: Home / Self Care | Payer: Medicare Other | Attending: Family Medicine | Admitting: Family Medicine

## 2018-01-31 ENCOUNTER — Encounter (HOSPITAL_COMMUNITY): Payer: Self-pay | Admitting: Emergency Medicine

## 2018-01-31 DIAGNOSIS — E119 Type 2 diabetes mellitus without complications: Secondary | ICD-10-CM | POA: Diagnosis not present

## 2018-01-31 DIAGNOSIS — S91001A Unspecified open wound, right ankle, initial encounter: Secondary | ICD-10-CM | POA: Diagnosis not present

## 2018-01-31 DIAGNOSIS — W2209XA Striking against other stationary object, initial encounter: Secondary | ICD-10-CM

## 2018-01-31 MED ORDER — MUPIROCIN CALCIUM 2 % NA OINT: TOPICAL_OINTMENT | NASAL | 1 refills | Status: DC

## 2018-01-31 NOTE — ED Provider Notes (Signed)
[place]    CSN: [id number] Arrival date & time: 01/31/18  1000     History   Chief Complaint Chief Complaint  Patient presents with  . Ankle Pain    HPI Teresa Kerr is a 67 y.o. female.   Patient struck her right posterior ankle on a door frame about a week ago.  She went to her PCP who did not take her insurance anymore so she has not received any care.  She is a diabetic.  The wound is getting larger.  She has provided local care with an over-the-counter topical and hydrogen peroxide.  Her diabetes has been poorly controlled by her admission.  HPI  Past Medical History:  Diagnosis Date  . Asthma   . Diabetes mellitus   . Gout   . Hypertension   . Neuromuscular disorder ([place])    right sides weakness arm and leg, steroid injections to right shoulder-Dr. Beane(last 8'16)  . Stroke [facility]) 2002, 1999   pt has right sided weakness at times, gets ESI when weakness returns  . Umbilical hernia   . Uterine fibroid     Patient Active Problem List   Diagnosis Date Noted  . RBBB 01/31/2017  . Elevated troponin 01/31/2017  . Influenza 01/16/2017  . Anxiety 01/16/2017  . Chest pain 01/16/2017  . Diabetes mellitus with complication ([place])   . COPD exacerbation ([place])   . Influenza B   . Thyroid nodule   . Acute respiratory failure with hypoxia ([place])   . Pleural effusion, R, ? hemothorax 04/24/2012  . Community acquired pneumonia 04/20/2012  . Hypoxia 04/20/2012  . Shingles 04/20/2012  . Rib pain 04/20/2012  . Hypertension 04/20/2012  . History of stroke 04/20/2012  . Chronic anticoagulation 04/20/2012  . Diabetes mellitus ([place]) 04/20/2012  . FIBROIDS, UTERUS 05/16/2007  . HX, PERSONAL, TOBACCO USE 05/16/2007  . GOUT 05/14/2007  . OBESITY 05/14/2007  . Essential hypertension 05/14/2007  . ALLERGIC RHINITIS, SEASONAL 05/14/2007  . Asthma 05/14/2007    Past Surgical History:  Procedure Laterality Date  . ANGIOPLASTY     x 2 times-cerebral  arterial stents- [facility]  . CYSTOSCOPY WITH RETROGRADE PYELOGRAM, URETEROSCOPY AND STENT PLACEMENT Left 09/01/2015   Procedure: CYSTOSCOPY WITH LEFT RETROGRADE, LEFT URETEROSCOPY AND STENT PLACEMENT;  Surgeon: Irine Seal, MD;  Location: [facility];  Service: Urology;  Laterality: Left;  . HAND SURGERY    . HOLMIUM LASER APPLICATION Left 24/01/8098   Procedure: WITH HOLMIUM LASER ;  Surgeon: Irine Seal, MD;  Location: [facility];  Service: Urology;  Laterality: Left;  . TOE SURGERY      OB History    Gravida  3   Para  3   Term  3   Preterm      AB      Living  3     SAB      TAB      Ectopic      Multiple      Live Births               Home Medications    Prior to Admission medications   Medication Sig Start Date End Date Taking? Authorizing Provider  acetaminophen (TYLENOL) 325 MG tablet Take 325-650 mg by mouth every 6 (six) hours as needed for mild pain, moderate pain, fever or headache.    [provider]  allopurinol (ZYLOPRIM) 100 MG tablet Take 100 mg by mouth daily. 01/04/17   [provider]  amLODipine (  NORVASC) 10 MG tablet Take 10 mg by mouth daily.      [provider]  atorvastatin (LIPITOR) 10 MG tablet Take 10 mg by mouth daily at 6 PM.  01/25/15   [provider]  cloNIDine (CATAPRES) 0.1 MG tablet Take 0.1 mg by mouth 2 (two) times daily. 08/22/15   [provider]  clopidogrel (PLAVIX) 75 MG tablet Take 75 mg by mouth daily.  05/01/12   [provider]  colchicine 0.6 MG tablet Take 0.6-1.2 mg by mouth See admin instructions. 1.2 mg with flares; take 0.6 mg if still present after one hour    [provider]  DEXILANT 30 MG capsule Take 30 mg by mouth daily. 11/26/16   [provider]  Fluticasone-Salmeterol (ADVAIR) 250-50 MCG/DOSE AEPB Inhale 2 puffs into the lungs daily as needed (for flares).     [provider]  furosemide (LASIX) 20 MG tablet Take 20 mg by mouth daily.      [provider]  gabapentin (NEURONTIN) 300 MG capsule Take 300 mg by mouth 2 (two) times daily.    [provider]  insulin aspart (NOVOLOG) 100 UNIT/ML injection Inject 0-15 Units into the skin 3 (three) times daily with meals. 01/18/17   Jani Gravel, MD  insulin aspart (NOVOLOG) 100 UNIT/ML injection Inject 0-5 Units into the skin at bedtime. 01/18/17   Jani Gravel, MD  insulin aspart (NOVOLOG) 100 UNIT/ML injection Inject 5 Units into the skin 3 (three) times daily before meals. 01/18/17   Jani Gravel, MD  ipratropium-albuterol (DUONEB) 0.5-2.5 (3) MG/3ML SOLN Take 3 mLs by nebulization every 4 (four) hours as needed. 01/18/17   Jani Gravel, MD  LEVEMIR FLEXTOUCH 100 UNIT/ML Pen Inject 50 Units into the skin at bedtime. Patient taking differently: Inject into the skin at bedtime. Per pt: 54u at night, and 8u q AM 01/18/17   Jani Gravel, MD  lisinopril (PRINIVIL,ZESTRIL) 40 MG tablet Take 40 mg by mouth daily.  01/05/15   [provider]  meloxicam (MOBIC) 15 MG tablet Take 15 mg by mouth daily. 01/04/17   [provider]  ondansetron (ZOFRAN) 4 MG tablet Take 1 tablet (4 mg total) by mouth every 8 (eight) hours as needed for nausea or vomiting. 08/09/15   Hoyle Sauer, MD  PROAIR RESPICLICK 500 (90 BASE) MCG/ACT AEPB Inhale 2 puffs into the lungs every 6 (six) hours as needed (shortness of breath).  10/25/14   [provider]  tiZANidine (ZANAFLEX) 2 MG tablet Take 2 mg by mouth 2 (two) times daily. 01/04/17   [provider]  traMADol (ULTRAM) 50 MG tablet Take 1 tablet (50 mg total) by mouth every 6 (six) hours as needed. 09/21/17   Duanne Guess, PA-C    Family History Family History  Problem Relation Age of Onset  . Cancer Mother        breast/ had mastecomy  . Cancer Father        colon    Social History Social History   Tobacco Use  . Smoking status: Former Smoker    Packs/day: 1.50    Years: 34.00    Pack years: 51.00    Last  attempt to quit: 09/24/1997    Years since quitting: 20.3  . Smokeless tobacco: Never Used  Substance Use Topics  . Alcohol use: No  . Drug use: No     Allergies   Latex   Review of Systems Review of Systems  Constitutional: Negative for  chills and fever.  HENT: Negative for ear pain and sore throat.   Eyes: Negative for pain and visual disturbance.  Respiratory: Negative for cough and shortness of breath.   Cardiovascular: Negative for chest pain and palpitations.  Gastrointestinal: Negative for abdominal pain and vomiting.  Genitourinary: Negative for dysuria and hematuria.  Musculoskeletal: Negative for arthralgias and back pain.  Skin: Positive for wound. Negative for color change and rash.  Neurological: Negative for seizures and syncope.  All other systems reviewed and are negative.    Physical Exam Triage Vital Signs ED Triage Vitals [01/31/18 1023]  Enc Vitals Group     BP (!) 146/93     Pulse Rate (!) 106     Resp 16     Temp 98.1 F (36.7 C)     Temp Source Oral     SpO2 93 %     Weight      Height      Head Circumference      Peak Flow      Pain Score      Pain Loc      Pain Edu?      Excl. in [place]?    No data found.  Updated Vital Signs BP (!) 146/93 Comment: Nurse Kenton Kingfisher aware of BP, patient stated that she has not taken her BP medication this morning  Pulse (!) 106 Comment: Nurse Kenton Kingfisher aware of HR  Temp 98.1 F (36.7 C) (Oral)   Resp 16   SpO2 93%   Visual Acuity Right Eye Distance:   Left Eye Distance:   Bilateral Distance:    Right Eye Near:   Left Eye Near:    Bilateral Near:     Physical Exam  Constitutional: She appears well-developed and well-nourished.  Cardiovascular: Normal rate and regular rhythm.  Pulmonary/Chest: Effort normal and breath sounds normal.  Skin:  There is a full-thickness wound on posterior aspect of her right heel.  Achilles function seems to be intact with normal flexion dorsiflexion of the forefoot.   There is no drainage or surrounding erythema.     UC Treatments / Results  Labs (all labs ordered are listed, but only abnormal results are displayed) Labs Reviewed - No data to display  EKG None  Radiology No results found.  Procedures Procedures (including critical care time)  Medications Ordered in UC Medications - No data to display  Initial Impression / Assessment and Plan / UC Course  I have reviewed the triage vital signs and the nursing notes.  Pertinent labs & imaging results that were available during my care of the patient were reviewed by me and considered in my medical decision making (see chart for details).     Wound right ankle.  Since she is a diabetic there is much potential for this causing limb loss.  We will refer her to wound care.  Urged her to avoid carbohydrates and sweets and try to manage her diabetes.  Will continue local care with ExerBand ointment and peroxide washes daily Final Clinical Impressions(s) / UC Diagnoses   Final diagnoses:  None   Discharge Instructions   None    ED Prescriptions    None     Controlled Substance Prescriptions [place] Controlled Substance Registry consulted? No   Wardell Honour, MD 01/31/18 1046

## 2018-01-31 NOTE — ED Triage Notes (Signed)
Pt states two weeks ago she hit her R ankle on the door, c/o ongoing pain.

## 2018-02-03 ENCOUNTER — Telehealth (HOSPITAL_COMMUNITY): Payer: Self-pay

## 2018-02-03 NOTE — Telephone Encounter (Signed)
Called to schedule angio, no answer, left vm. AW  

## 2018-02-06 ENCOUNTER — Encounter (HOSPITAL_BASED_OUTPATIENT_CLINIC_OR_DEPARTMENT_OTHER): Payer: Medicare Other | Attending: Internal Medicine

## 2018-02-06 ENCOUNTER — Other Ambulatory Visit (HOSPITAL_COMMUNITY): Payer: Self-pay | Admitting: Interventional Radiology

## 2018-02-06 DIAGNOSIS — E11622 Type 2 diabetes mellitus with other skin ulcer: Secondary | ICD-10-CM | POA: Insufficient documentation

## 2018-02-06 DIAGNOSIS — Z794 Long term (current) use of insulin: Secondary | ICD-10-CM | POA: Diagnosis not present

## 2018-02-06 DIAGNOSIS — Z8673 Personal history of transient ischemic attack (TIA), and cerebral infarction without residual deficits: Secondary | ICD-10-CM | POA: Insufficient documentation

## 2018-02-06 DIAGNOSIS — E669 Obesity, unspecified: Secondary | ICD-10-CM | POA: Diagnosis not present

## 2018-02-06 DIAGNOSIS — J449 Chronic obstructive pulmonary disease, unspecified: Secondary | ICD-10-CM | POA: Diagnosis not present

## 2018-02-06 DIAGNOSIS — L97312 Non-pressure chronic ulcer of right ankle with fat layer exposed: Secondary | ICD-10-CM | POA: Diagnosis not present

## 2018-02-06 DIAGNOSIS — E1165 Type 2 diabetes mellitus with hyperglycemia: Secondary | ICD-10-CM | POA: Insufficient documentation

## 2018-02-06 DIAGNOSIS — Z6839 Body mass index (BMI) 39.0-39.9, adult: Secondary | ICD-10-CM | POA: Insufficient documentation

## 2018-02-06 DIAGNOSIS — I10 Essential (primary) hypertension: Secondary | ICD-10-CM | POA: Diagnosis not present

## 2018-02-06 DIAGNOSIS — G4730 Sleep apnea, unspecified: Secondary | ICD-10-CM | POA: Insufficient documentation

## 2018-02-06 DIAGNOSIS — Z87891 Personal history of nicotine dependence: Secondary | ICD-10-CM | POA: Diagnosis not present

## 2018-02-06 DIAGNOSIS — I771 Stricture of artery: Secondary | ICD-10-CM

## 2018-02-13 ENCOUNTER — Encounter (HOSPITAL_BASED_OUTPATIENT_CLINIC_OR_DEPARTMENT_OTHER): Payer: Medicare Other

## 2018-02-14 DIAGNOSIS — E11622 Type 2 diabetes mellitus with other skin ulcer: Secondary | ICD-10-CM | POA: Diagnosis not present

## 2018-02-25 ENCOUNTER — Encounter (HOSPITAL_BASED_OUTPATIENT_CLINIC_OR_DEPARTMENT_OTHER): Payer: Medicare Other | Attending: Internal Medicine

## 2018-02-25 DIAGNOSIS — I10 Essential (primary) hypertension: Secondary | ICD-10-CM | POA: Diagnosis not present

## 2018-02-25 DIAGNOSIS — G4730 Sleep apnea, unspecified: Secondary | ICD-10-CM | POA: Insufficient documentation

## 2018-02-25 DIAGNOSIS — E11621 Type 2 diabetes mellitus with foot ulcer: Secondary | ICD-10-CM | POA: Insufficient documentation

## 2018-02-25 DIAGNOSIS — J449 Chronic obstructive pulmonary disease, unspecified: Secondary | ICD-10-CM | POA: Insufficient documentation

## 2018-02-25 DIAGNOSIS — L97413 Non-pressure chronic ulcer of right heel and midfoot with necrosis of muscle: Secondary | ICD-10-CM | POA: Diagnosis not present

## 2018-02-25 DIAGNOSIS — Z794 Long term (current) use of insulin: Secondary | ICD-10-CM | POA: Insufficient documentation

## 2018-03-02 ENCOUNTER — Other Ambulatory Visit: Payer: Self-pay | Admitting: Radiology

## 2018-03-03 ENCOUNTER — Other Ambulatory Visit: Payer: Self-pay | Admitting: Student

## 2018-03-04 ENCOUNTER — Other Ambulatory Visit (HOSPITAL_COMMUNITY): Payer: Self-pay | Admitting: Interventional Radiology

## 2018-03-04 ENCOUNTER — Ambulatory Visit (HOSPITAL_COMMUNITY)
Admission: RE | Admit: 2018-03-04 | Discharge: 2018-03-04 | Disposition: A | Discharge status: Home / Self Care | Payer: Medicare Other | Source: Ambulatory Visit | Attending: Interventional Radiology | Admitting: Interventional Radiology

## 2018-03-04 ENCOUNTER — Encounter (HOSPITAL_COMMUNITY): Payer: Self-pay

## 2018-03-04 DIAGNOSIS — I69351 Hemiplegia and hemiparesis following cerebral infarction affecting right dominant side: Secondary | ICD-10-CM | POA: Diagnosis not present

## 2018-03-04 DIAGNOSIS — Z7902 Long term (current) use of antithrombotics/antiplatelets: Secondary | ICD-10-CM | POA: Insufficient documentation

## 2018-03-04 DIAGNOSIS — Z885 Allergy status to narcotic agent status: Secondary | ICD-10-CM | POA: Diagnosis not present

## 2018-03-04 DIAGNOSIS — Z91040 Latex allergy status: Secondary | ICD-10-CM | POA: Diagnosis not present

## 2018-03-04 DIAGNOSIS — I6601 Occlusion and stenosis of right middle cerebral artery: Secondary | ICD-10-CM | POA: Insufficient documentation

## 2018-03-04 DIAGNOSIS — E119 Type 2 diabetes mellitus without complications: Secondary | ICD-10-CM | POA: Diagnosis not present

## 2018-03-04 DIAGNOSIS — I10 Essential (primary) hypertension: Secondary | ICD-10-CM | POA: Insufficient documentation

## 2018-03-04 DIAGNOSIS — Z87891 Personal history of nicotine dependence: Secondary | ICD-10-CM | POA: Insufficient documentation

## 2018-03-04 DIAGNOSIS — Z794 Long term (current) use of insulin: Secondary | ICD-10-CM | POA: Diagnosis not present

## 2018-03-04 DIAGNOSIS — I69392 Facial weakness following cerebral infarction: Secondary | ICD-10-CM | POA: Insufficient documentation

## 2018-03-04 DIAGNOSIS — I771 Stricture of artery: Secondary | ICD-10-CM

## 2018-03-04 DIAGNOSIS — J45909 Unspecified asthma, uncomplicated: Secondary | ICD-10-CM | POA: Diagnosis not present

## 2018-03-04 DIAGNOSIS — M109 Gout, unspecified: Secondary | ICD-10-CM | POA: Diagnosis not present

## 2018-03-04 HISTORY — PX: IR ANGIO VERTEBRAL SEL SUBCLAVIAN INNOMINATE UNI R MOD SED: IMG5365

## 2018-03-04 HISTORY — PX: IR ANGIO INTRA EXTRACRAN SEL COM CAROTID INNOMINATE BILAT MOD SED: IMG5360

## 2018-03-04 HISTORY — PX: IR ANGIO VERTEBRAL SEL VERTEBRAL UNI L MOD SED: IMG5367

## 2018-03-04 LAB — CBC
HCT: 44.2 % (ref 36.0–46.0)
Hemoglobin: 14.7 g/dL (ref 12.0–15.0)
MCH: 29.1 pg (ref 26.0–34.0)
MCHC: 33.3 g/dL (ref 30.0–36.0)
MCV: 87.4 fL (ref 78.0–100.0)
PLATELETS: 332 10*3/uL (ref 150–400)
RBC: 5.06 MIL/uL (ref 3.87–5.11)
RDW: 12.5 % (ref 11.5–15.5)
WBC: 9.3 10*3/uL (ref 4.0–10.5)

## 2018-03-04 LAB — PROTIME-INR
INR: 0.93
PROTHROMBIN TIME: 12.4 s (ref 11.4–15.2)

## 2018-03-04 LAB — GLUCOSE, CAPILLARY
GLUCOSE-CAPILLARY: 304 mg/dL — AB (ref 65–99)
GLUCOSE-CAPILLARY: 306 mg/dL — AB (ref 65–99)
GLUCOSE-CAPILLARY: 257 mg/dL — AB (ref 65–99)

## 2018-03-04 LAB — BASIC METABOLIC PANEL
Anion gap: 11 (ref 5–15)
BUN: 15 mg/dL (ref 6–20)
CALCIUM: 9.5 mg/dL (ref 8.9–10.3)
CHLORIDE: 103 mmol/L (ref 101–111)
CO2: 26 mmol/L (ref 22–32)
Creatinine, Ser: 0.84 mg/dL (ref 0.44–1.00)
GFR calc non Af Amer: >60 mL/min (ref >60)
Glucose, Bld: 300 mg/dL — ABNORMAL HIGH (ref 65–99)
Potassium: 4.1 mmol/L (ref 3.5–5.1)
SODIUM: 140 mmol/L (ref 135–145)

## 2018-03-04 MED ORDER — INSULIN ASPART 100 UNIT/ML ~~LOC~~ SOLN
SUBCUTANEOUS | Status: AC
  Filled 2018-03-04: qty 1

## 2018-03-04 MED ORDER — FENTANYL CITRATE (PF) 100 MCG/2ML IJ SOLN
INTRAMUSCULAR | Status: AC | PRN
  Administered 2018-03-04: 25 ug via INTRAVENOUS

## 2018-03-04 MED ORDER — INSULIN ASPART 100 UNIT/ML ~~LOC~~ SOLN
4.0000 [IU] | Freq: Once | SUBCUTANEOUS | Status: AC
  Administered 2018-03-04: 4 [IU] via SUBCUTANEOUS

## 2018-03-04 MED ORDER — IOHEXOL 300 MG/ML  SOLN: 82.0000 mL | Freq: Once | INTRAMUSCULAR | Status: DC | PRN

## 2018-03-04 MED ORDER — LIDOCAINE HCL (PF) 1 % IJ SOLN
INTRAMUSCULAR | Status: AC | PRN
  Administered 2018-03-04: 10 mL

## 2018-03-04 MED ORDER — SODIUM CHLORIDE 0.9 % IV SOLN
INTRAVENOUS | Status: AC
  Administered 2018-03-04: 75 mL/h via INTRAVENOUS

## 2018-03-04 MED ORDER — ACETAMINOPHEN 325 MG PO TABS
ORAL_TABLET | ORAL | Status: AC
  Administered 2018-03-04: 650 mg via ORAL
  Filled 2018-03-04: qty 2

## 2018-03-04 MED ORDER — MIDAZOLAM HCL 2 MG/2ML IJ SOLN
INTRAMUSCULAR | Status: AC | PRN
  Administered 2018-03-04: 1 mg via INTRAVENOUS

## 2018-03-04 MED ORDER — ACETAMINOPHEN 325 MG PO TABS
650.0000 mg | ORAL_TABLET | Freq: Once | ORAL | Status: AC
  Administered 2018-03-04: 650 mg via ORAL

## 2018-03-04 MED ORDER — MIDAZOLAM HCL 2 MG/2ML IJ SOLN
INTRAMUSCULAR | Status: AC
  Filled 2018-03-04: qty 2

## 2018-03-04 MED ORDER — HEPARIN SODIUM (PORCINE) 1000 UNIT/ML IJ SOLN
INTRAMUSCULAR | Status: AC | PRN
  Administered 2018-03-04: 1000 [IU] via INTRAVENOUS

## 2018-03-04 MED ORDER — SODIUM CHLORIDE 0.9 % IV SOLN
INTRAVENOUS | Status: AC | PRN
  Administered 2018-03-04: 75 mL/h via INTRAVENOUS

## 2018-03-04 MED ORDER — HEPARIN SODIUM (PORCINE) 1000 UNIT/ML IJ SOLN
INTRAMUSCULAR | Status: AC
  Filled 2018-03-04: qty 2

## 2018-03-04 MED ORDER — FENTANYL CITRATE (PF) 100 MCG/2ML IJ SOLN
INTRAMUSCULAR | Status: AC
  Filled 2018-03-04: qty 2

## 2018-03-04 MED ORDER — LIDOCAINE HCL 1 % IJ SOLN
INTRAMUSCULAR | Status: AC
  Filled 2018-03-04: qty 20

## 2018-03-04 MED ORDER — INSULIN ASPART 100 UNIT/ML ~~LOC~~ SOLN
0.0000 [IU] | Freq: Three times a day (TID) | SUBCUTANEOUS | Status: DC
  Administered 2018-03-04: 8 [IU] via SUBCUTANEOUS

## 2018-03-04 MED ORDER — SODIUM CHLORIDE 0.9 % IV SOLN
Freq: Once | INTRAVENOUS | Status: AC
  Administered 2018-03-04: 07:00:00 via INTRAVENOUS

## 2018-03-04 NOTE — Progress Notes (Signed)
Discharge  Instruction given pt MD order.  Pt able understand instructions.

## 2018-03-04 NOTE — Sedation Documentation (Signed)
Nasal cannula and etCO2 monitor removed per MD request. Will continue to monitor closely.

## 2018-03-04 NOTE — Procedures (Signed)
S/P 4 vessel cerebral arteriogram. RT CFA approach. Findings. 1.Patent Lt MCA M1 seg  previously angioplastied site. 2.Mild stenosis of RT MCA and RT ACA at origins. 3.Severe stenosis of LT PICA prox. 4.Approx 50 % stenosis of  RT PCA P1P2 seg

## 2018-03-04 NOTE — H&P (Addendum)
Chief Complaint: Patient was seen in consultation today for cerebral arteriogram   Supervising Physician: Teresa Kerr  Patient Status: [name] - Out-pt  History of Present Illness: Teresa Kerr is a 67 y.o. female   CVA 08/2000 L MCA angioplasty Lost to follow up for few years Angio 2011 with Teresa Kerr Stable arteriogram  MRI/MRA 2017: incomplete secondary asthma attack in scanner-- but stable  MRI/MRA 12/2017:  IMPRESSION: Atrophy and chronic ischemic change. No acute intracranial abnormality Left MCA widely patent at site of prior angioplasty Progression of right MCA stenosis with severe stenosis at the bifurcation. Moderate stenosis right PCA distally unchanged.  Pt is not followed by Neurology Says she sees Teresa Kerr for recheck  Speech is minimally slurred; rt facial droop; right side slow/weaker--- all "normal" per pt  Scheduled today for cerebral arteriogram  Past Medical History:  Diagnosis Date  . Asthma   . Diabetes mellitus   . Gout   . Hypertension   . Neuromuscular disorder ([place])    right sides weakness arm and leg, steroid injections to right shoulder-Teresa. Beane(last 8'16)  . Stroke [facility]) 2002, 1999   pt has right sided weakness at times, gets ESI when weakness returns  . Umbilical hernia   . Uterine fibroid     Past Surgical History:  Procedure Laterality Date  . ANGIOPLASTY     x 2 times-cerebral arterial stents- [facility]  . CYSTOSCOPY WITH RETROGRADE PYELOGRAM, URETEROSCOPY AND STENT PLACEMENT Left 09/01/2015   Procedure: CYSTOSCOPY WITH LEFT RETROGRADE, LEFT URETEROSCOPY AND STENT PLACEMENT;  Surgeon: Irine Seal, MD;  Location[facility];  Service: Urology;  Laterality: Left;  . HAND SURGERY    . HOLMIUM LASER APPLICATION Left 78/10/9560   Procedure: WITH HOLMIUM LASER ;  Surgeon: Irine Seal, MD;  Location: [facility];  Service: Urology;  Laterality: Left;  . TOE SURGERY       Allergies: Hydrocodone-acetaminophen and Latex  Medications: Prior to Admission medications   Medication Sig Start Date End Date Taking? Authorizing Provider  allopurinol (ZYLOPRIM) 100 MG tablet Take 100 mg by mouth daily. 01/04/17  Yes [provider]  amLODipine (NORVASC) 10 MG tablet Take 10 mg by mouth daily.     Yes [provider]  atorvastatin (LIPITOR) 10 MG tablet Take 10 mg by mouth daily at 6 PM.  01/25/15  Yes [provider]  cloNIDine (CATAPRES) 0.1 MG tablet Take 0.1 mg by mouth 2 (two) times daily. 08/22/15  Yes [provider]  clopidogrel (PLAVIX) 75 MG tablet Take 75 mg by mouth daily.  05/01/12  Yes [provider]  colchicine 0.6 MG tablet Take 0.6-1.2 mg by mouth See admin instructions. 1.2 mg with flares; take 0.6 mg if still present after one hour   Yes [provider]  DEXILANT 30 MG capsule Take 30 mg by mouth daily. 11/26/16  Yes [provider]  Fluticasone-Salmeterol (ADVAIR) 250-50 MCG/DOSE AEPB Inhale 2 puffs into the lungs daily as needed (for flares).    Yes [provider]  furosemide (LASIX) 20 MG tablet Take 20 mg by mouth daily.    Yes [provider]  gabapentin (NEURONTIN) 300 MG capsule Take 300 mg by mouth 2 (two) times daily.   Yes [provider]  ipratropium-albuterol (DUONEB) 0.5-2.5 (3) MG/3ML SOLN Take 3 mLs by nebulization every 4 (four) hours as needed. 01/18/17  Yes Jani Gravel, MD  LEVEMIR FLEXTOUCH 100 UNIT/ML Pen Inject 50 Units into the skin at  bedtime. Patient taking differently: Inject into the skin at bedtime. Per pt: 54u at night, and 8u q AM 01/18/17  Yes Jani Gravel, MD  lisinopril (PRINIVIL,ZESTRIL) 40 MG tablet Take 40 mg by mouth daily.  01/05/15  Yes [provider]  meloxicam (MOBIC) 15 MG tablet Take 15 mg by mouth daily. 01/04/17  Yes [provider]  ondansetron (ZOFRAN) 4 MG tablet Take 1 tablet (4 mg total) by mouth every 8  (eight) hours as needed for nausea or vomiting. 08/09/15  Yes Hoyle Sauer, MD  PROAIR RESPICLICK 166 (90 BASE) MCG/ACT AEPB Inhale 2 puffs into the lungs every 6 (six) hours as needed (shortness of breath).  10/25/14  Yes [provider]  tiZANidine (ZANAFLEX) 2 MG tablet Take 2 mg by mouth 2 (two) times daily. 01/04/17  Yes [provider]  traMADol (ULTRAM) 50 MG tablet Take 1 tablet (50 mg total) by mouth every 6 (six) hours as needed. 09/21/17  Yes Duanne Guess, PA-C  acetaminophen (TYLENOL) 325 MG tablet Take 325-650 mg by mouth every 6 (six) hours as needed for mild pain, moderate pain, fever or headache.    [provider]  mupirocin nasal ointment (BACTROBAN) 2 % Apply in each nostril daily 01/31/18   Wardell Honour, MD     Family History  Problem Relation Age of Onset  . Cancer Mother        breast/ had mastecomy  . Cancer Father        colon    Social History   Socioeconomic History  . Marital status: Widowed    Spouse name: Not on file  . Number of children: 3  . Years of education: Not on file  . Highest education level: Not on file  Occupational History  . Not on file  Social Needs  . Financial resource strain: Not on file  . Food insecurity:    Worry: Not on file    Inability: Not on file  . Transportation needs:    Medical: Not on file    Non-medical: Not on file  Tobacco Use  . Smoking status: Former Smoker    Packs/day: 1.50    Years: 34.00    Pack years: 51.00    Last attempt to quit: 09/24/1997    Years since quitting: 20.4  . Smokeless tobacco: Never Used  Substance and Sexual Activity  . Alcohol use: No  . Drug use: No  . Sexual activity: Not on file  Lifestyle  . Physical activity:    Days per week: Not on file    Minutes per session: Not on file  . Stress: Not on file  Relationships  . Social connections:    Talks on phone: Not on file    Gets together: Not on file    Attends religious service: Not on file     Active member of club or organization: Not on file    Attends meetings of clubs or organizations: Not on file    Relationship status: Not on file  Other Topics Concern  . Not on file  Social History Narrative  . Not on file    Review of Systems: A 12 point ROS discussed and pertinent positives are indicated in the HPI above.  All other systems are negative.  Review of Systems  Constitutional: Positive for fatigue. Negative for activity change, appetite change, fever and unexpected weight change.  HENT: Negative for tinnitus and trouble swallowing.   Eyes: Negative for visual disturbance.  Respiratory: Negative for cough and shortness of breath.   Cardiovascular: Negative for chest pain.  Gastrointestinal: Negative for abdominal pain.  Musculoskeletal: Positive for gait problem. Negative for back pain.  Neurological: Positive for facial asymmetry and weakness. Negative for dizziness, tremors, seizures, syncope, speech difficulty, light-headedness, numbness and headaches.  Psychiatric/Behavioral: Negative for behavioral problems and confusion.    Vital Signs: BP (!) 145/90   Pulse 83   Temp 97.7 F (36.5 C) (Oral)   Resp 18   Ht 5\' 5"  (1.651 m)   Wt 240 lb (108.9 kg)   SpO2 98%   BMI 39.94 kg/m   Physical Exam  Constitutional: She is oriented to person, place, and time. She appears well-nourished.  Right side facial droop (since 2001 CVA)  HENT:  Head: Atraumatic.  Eyes: EOM are normal.  Neck: Neck supple.  Cardiovascular: Normal rate and regular rhythm.  Pulmonary/Chest: Effort normal and breath sounds normal.  Abdominal: Soft. Bowel sounds are normal.  Musculoskeletal: Normal range of motion.  Right side is slower and weaker Follows all commands Moves all 4s  Neurological: She is alert and oriented to person, place, and time.  Skin: Skin is warm and dry.  Psychiatric: She has a normal mood and affect. Her behavior is normal. Judgment and thought content normal.   Vitals reviewed.   Imaging: No results found.  Labs:  CBC: No results for input(s): WBC, HGB, HCT, PLT in the last 8760 hours.  COAGS: No results for input(s): INR, APTT in the last 8760 hours.  BMP: Recent Labs    01/15/18 1045  CREATININE 0.97  GFRNONAA >60  GFRAA >60    LIVER FUNCTION TESTS: No results for input(s): BILITOT, AST, ALT, ALKPHOS, PROT, ALBUMIN in the last 8760 hours.  TUMOR MARKERS: No results for input(s): AFPTM, CEA, CA199, CHROMGRNA in the last 8760 hours.  Assessment and Plan:  CVA; L MCA angioplasty 08/2000 Follow up with Teresa Kerr Residual rt sided weakness; rt facial droop MRI/MRA 12/2017 revealing Rt MCA stenosis Scheduled for arteriogram and evaluation Risks and benefits of cerebral angiogram with intervention were discussed with the patient including, but not limited to bleeding, infection, vascular injury, contrast induced renal failure, stroke or even death.  This interventional procedure involves the use of X-rays and because of the nature of the planned procedure, it is possible that we will have prolonged use of X-ray fluoroscopy.  Potential radiation risks to you include (but are not limited to) the following: - A slightly elevated risk for cancer  several years later in life. This risk is typically less than 0.5% percent. This risk is low in comparison to the normal incidence of human cancer, which is 33% for women and 50% for men according to the [employer]. - Radiation induced injury can include skin redness, resembling a rash, tissue breakdown / ulcers and hair loss (which can be temporary or permanent).   The likelihood of either of these occurring depends on the difficulty of the procedure and whether you are sensitive to radiation due to previous procedures, disease, or genetic conditions.   IF your procedure requires a prolonged use of radiation, you will be notified and given written instructions for further  action.  It is your responsibility to monitor the irradiated area for the 2 weeks following the procedure and to notify your physician if you are concerned that you have suffered a radiation induced injury.    All of the patient's questions were answered, patient is agreeable to  proceed.  Consent signed and in chart.  Thank you for this interesting consult.  I greatly enjoyed meeting MAKHIYA COBURN and look forward to participating in their care.  A copy of this report was sent to the requesting provider on this date.  Electronically Signed: Lavonia Drafts, PA-C 03/04/2018, 7:31 AM   I spent a total of  30 Minutes   in face to face in clinical consultation, greater than 50% of which was counseling/coordinating care for cerebral arteriogram

## 2018-03-04 NOTE — Discharge Instructions (Addendum)
Cerebral Angiogram, Care After °Refer to this sheet in the next few weeks. These instructions provide you with information on caring for yourself after your procedure. Your health care provider may also give you more specific instructions. Your treatment has been planned according to current medical practices, but problems sometimes occur. Call your health care provider if you have any problems or questions after your procedure. °What can I expect after the procedure? °After your procedure, it is typical to have the following: °· Bruising at the catheter insertion site that usually fades within 1-2 weeks. °· Blood collecting in the tissue (hematoma) that may be painful to the touch. It should usually decrease in size and tenderness within 1-2 weeks. °· A mild headache. ° °Follow these instructions at home: °· Take medicines only as directed by your health care provider. °· You may shower 24-48 hours after the procedure or as directed by your health care provider. Remove the bandage (dressing) and gently wash the site with plain soap and water. Pat the area dry with a clean towel. Do not rub the site, because this may cause bleeding. °· Do not take baths, swim, or use a hot tub until your health care provider approves. °· Check your insertion site every day for redness, swelling, or drainage. °· Do not apply powder or lotion to the site. °· Do not lift over 10 lb (4.5 kg) for 5 days after your procedure or as directed by your health care provider. °· Ask your health care provider when it is okay to: °? Return to work or school. °? Resume usual physical activities or sports. °? Resume sexual activity. °· Do not drive home if you are discharged the same day as the procedure. Have someone else drive you. °· You may drive 24 hours after the procedure unless otherwise instructed by your health care provider. °· Do not operate machinery or power tools for 24 hours after the procedure or as directed by your health care  provider. °· If your procedure was done as an outpatient procedure, which means that you went home the same day as your procedure, a responsible adult should be with you for the first 24 hours after you arrive home. °· Keep all follow-up visits as directed by your health care provider. This is important. °Contact a health care provider if: °· You have a fever. °· You have chills. °· You have increased bleeding from the catheter insertion site. Hold pressure on the site. °Get help right away if: °· You have vision changes or loss of vision. °· You have numbness or weakness on one side of your body. °· You have difficulty talking, or you have slurred speech or cannot speak (aphasia). °· You feel confused or have difficulty remembering. °· You have unusual pain at the catheter insertion site. °· You have redness, warmth, or swelling at the catheter insertion site. °· You have drainage (other than a small amount of blood on the dressing) from the catheter insertion site. °· The catheter insertion site is bleeding, and the bleeding does not stop after 30 minutes of holding steady pressure on the site. °These symptoms may represent a serious problem that is an emergency. Do not wait to see if the symptoms will go away. Get medical help right away. Call your local emergency services (911 in [place] Do not drive yourself to the hospital. °This information is not intended to replace advice given to you by your health care provider. Make sure you discuss any questions   you have with your health care provider. °Document Released: 01/25/2014 Document Revised: 02/16/2016 Document Reviewed: 09/23/2013 °Elsevier Interactive Patient Education © 2017 [employer] °Moderate Conscious Sedation, Adult, Care After °These instructions provide you with information about caring for yourself after your procedure. Your health care provider may also give you more specific instructions. Your treatment has been planned according to current  medical practices, but problems sometimes occur. Call your health care provider if you have any problems or questions after your procedure. °What can I expect after the procedure? °After your procedure, it is common: °· To feel sleepy for several hours. °· To feel clumsy and have poor balance for several hours. °· To have poor judgment for several hours. °· To vomit if you eat too soon. ° °Follow these instructions at home: °For at least 24 hours after the procedure: ° °· Do not: °? Participate in activities where you could fall or become injured. °? Drive. °? Use heavy machinery. °? Drink alcohol. °? Take sleeping pills or medicines that cause drowsiness. °? Make important decisions or sign legal documents. °? Take care of children on your own. °· Rest. °Eating and drinking °· Follow the diet recommended by your health care provider. °· If you vomit: °? Drink water, juice, or soup when you can drink without vomiting. °? Make sure you have little or no nausea before eating solid foods. °General instructions °· Have a responsible adult stay with you until you are awake and alert. °· Take over-the-counter and prescription medicines only as told by your health care provider. °· If you smoke, do not smoke without supervision. °· Keep all follow-up visits as told by your health care provider. This is important. °Contact a health care provider if: °· You keep feeling nauseous or you keep vomiting. °· You feel light-headed. °· You develop a rash. °· You have a fever. °Get help right away if: °· You have trouble breathing. °This information is not intended to replace advice given to you by your health care provider. Make sure you discuss any questions you have with your health care provider. °Document Released: 07/01/2013 Document Revised: 02/13/2016 Document Reviewed: 12/31/2015 °Elsevier Interactive Patient Education © 2018 [employer] ° °

## 2018-03-06 ENCOUNTER — Encounter (HOSPITAL_COMMUNITY): Payer: Self-pay | Admitting: Interventional Radiology

## 2018-03-06 DIAGNOSIS — E11621 Type 2 diabetes mellitus with foot ulcer: Secondary | ICD-10-CM | POA: Diagnosis not present

## 2018-06-03 DIAGNOSIS — M5136 Other intervertebral disc degeneration, lumbar region: Secondary | ICD-10-CM | POA: Insufficient documentation

## 2018-06-03 DIAGNOSIS — M25559 Pain in unspecified hip: Secondary | ICD-10-CM | POA: Insufficient documentation

## 2018-06-03 DIAGNOSIS — M25569 Pain in unspecified knee: Secondary | ICD-10-CM | POA: Insufficient documentation

## 2018-09-30 ENCOUNTER — Other Ambulatory Visit: Payer: Self-pay | Admitting: Student

## 2018-09-30 ENCOUNTER — Other Ambulatory Visit (HOSPITAL_COMMUNITY): Payer: Self-pay | Admitting: Interventional Radiology

## 2018-09-30 DIAGNOSIS — I771 Stricture of artery: Secondary | ICD-10-CM

## 2018-10-22 ENCOUNTER — Ambulatory Visit (HOSPITAL_COMMUNITY): Admission: RE | Admit: 2018-10-22 | Payer: Medicare Other | Source: Ambulatory Visit

## 2018-10-22 ENCOUNTER — Ambulatory Visit (HOSPITAL_COMMUNITY): Payer: Medicare Other

## 2018-10-22 ENCOUNTER — Encounter (HOSPITAL_COMMUNITY): Payer: Self-pay

## 2018-10-31 ENCOUNTER — Ambulatory Visit (HOSPITAL_COMMUNITY)
Admission: RE | Admit: 2018-10-31 | Discharge: 2018-10-31 | Disposition: A | Discharge status: Home / Self Care | Payer: Medicare Other | Source: Ambulatory Visit | Attending: Interventional Radiology | Admitting: Interventional Radiology

## 2018-10-31 ENCOUNTER — Ambulatory Visit (HOSPITAL_COMMUNITY): Admission: RE | Admit: 2018-10-31 | Payer: Medicare Other | Source: Ambulatory Visit

## 2018-10-31 DIAGNOSIS — I771 Stricture of artery: Secondary | ICD-10-CM | POA: Insufficient documentation

## 2018-10-31 DIAGNOSIS — I6603 Occlusion and stenosis of bilateral middle cerebral arteries: Secondary | ICD-10-CM | POA: Insufficient documentation

## 2018-11-03 ENCOUNTER — Telehealth (HOSPITAL_COMMUNITY): Payer: Self-pay

## 2018-11-03 NOTE — Telephone Encounter (Signed)
Pt agreed to f/u in 6 months with mri/mra. AW  

## 2018-11-06 ENCOUNTER — Encounter: Payer: Self-pay | Admitting: Podiatry

## 2018-11-06 ENCOUNTER — Ambulatory Visit (INDEPENDENT_AMBULATORY_CARE_PROVIDER_SITE_OTHER): Payer: Medicare Other | Admitting: Podiatry

## 2018-11-06 VITALS — BP 162/96 | HR 95

## 2018-11-06 DIAGNOSIS — E119 Type 2 diabetes mellitus without complications: Secondary | ICD-10-CM | POA: Diagnosis not present

## 2018-11-06 DIAGNOSIS — M79674 Pain in right toe(s): Secondary | ICD-10-CM | POA: Diagnosis not present

## 2018-11-06 DIAGNOSIS — M79675 Pain in left toe(s): Secondary | ICD-10-CM | POA: Diagnosis not present

## 2018-11-06 DIAGNOSIS — Z9229 Personal history of other drug therapy: Secondary | ICD-10-CM

## 2018-11-06 DIAGNOSIS — B351 Tinea unguium: Secondary | ICD-10-CM | POA: Diagnosis not present

## 2018-11-06 DIAGNOSIS — Z794 Long term (current) use of insulin: Secondary | ICD-10-CM

## 2018-11-06 NOTE — Patient Instructions (Signed)

## 2018-11-10 ENCOUNTER — Encounter: Payer: Self-pay | Admitting: Podiatry

## 2018-11-10 NOTE — Progress Notes (Signed)
Subjective: Teresa Kerr presents today with history of diabetes referred by Teresa Ebbs, MD with longstanding history of elongated, painful, discolored, thick toenails which interfere with daily activities.  Pain is aggravated when wearing enclosed shoe gear.  Patient is afraid to cut her toenails because she is on blood thinner.  She denies any numbness, tingling or burning in her feet.  She does relate history of ankle wound secondary to trauma in 2019.  Wound has now healed.  Teresa Kerr states she does not check her blood sugar.  By her last hemoglobin A1c was 8%.  Past Medical History:  Diagnosis Date  . Asthma   . Diabetes mellitus   . Gout   . Hypertension   . Neuromuscular disorder (Teresa Kerr)    right sides weakness arm and leg, steroid injections to right shoulder-Dr. Beane(last 8'16)  . Stroke [facility]) 2002, 1999   pt has right sided weakness at times, gets ESI when weakness returns  . Umbilical hernia   . Uterine fibroid      Patient Active Problem List   Diagnosis Date Noted  . RBBB 01/31/2017  . Elevated troponin 01/31/2017  . Influenza 01/16/2017  . Anxiety 01/16/2017  . Chest pain 01/16/2017  . Diabetes mellitus with complication (Teresa Kerr)   . COPD exacerbation (Teresa Kerr)   . Influenza B   . Thyroid nodule   . Acute respiratory failure with hypoxia (Teresa Kerr)   . Pleural effusion, R, ? hemothorax 04/24/2012  . Community acquired pneumonia 04/20/2012  . Hypoxia 04/20/2012  . Shingles 04/20/2012  . Rib pain 04/20/2012  . Hypertension 04/20/2012  . History of stroke 04/20/2012  . Chronic anticoagulation 04/20/2012  . Diabetes mellitus ([place]) 04/20/2012  . FIBROIDS, UTERUS 05/16/2007  . HX, PERSONAL, TOBACCO USE 05/16/2007  . GOUT 05/14/2007  . OBESITY 05/14/2007  . Essential hypertension 05/14/2007  . ALLERGIC RHINITIS, SEASONAL 05/14/2007  . Asthma 05/14/2007     Past Surgical History:  Procedure Laterality Date  . ANGIOPLASTY     x 2 times-cerebral arterial  stents- Teresa Kerr  . CYSTOSCOPY WITH RETROGRADE PYELOGRAM, URETEROSCOPY AND STENT PLACEMENT Left 09/01/2015   Procedure: CYSTOSCOPY WITH LEFT RETROGRADE, LEFT URETEROSCOPY AND STENT PLACEMENT;  Surgeon: Teresa Seal, MD;  Location: [facility];  Service: Urology;  Laterality: Left;  . HAND SURGERY    . HOLMIUM LASER APPLICATION Left 60/02/3015   Procedure: WITH HOLMIUM LASER ;  Surgeon: Teresa Seal, MD;  Location: [facility];  Service: Urology;  Laterality: Left;  . IR ANGIO INTRA EXTRACRAN SEL COM CAROTID INNOMINATE BILAT MOD SED  03/04/2018  . IR ANGIO VERTEBRAL SEL SUBCLAVIAN INNOMINATE UNI R MOD SED  03/04/2018  . IR ANGIO VERTEBRAL SEL VERTEBRAL UNI L MOD SED  03/04/2018  . TOE SURGERY        Current Outpatient Medications:  .  acetaminophen (TYLENOL) 325 MG tablet, Take 325-650 mg by mouth every 6 (six) hours as needed for mild pain, moderate pain, fever or headache., Disp: , Rfl:  .  allopurinol (ZYLOPRIM) 100 MG tablet, Take 100 mg by mouth daily., Disp: , Rfl:  .  amLODipine (NORVASC) 10 MG tablet, Take 10 mg by mouth daily.  , Disp: , Rfl:  .  atorvastatin (LIPITOR) 10 MG tablet, Take 10 mg by mouth daily at 6 PM. , Disp: , Rfl: 3 .  cloNIDine (CATAPRES) 0.1 MG tablet, Take 0.1 mg by mouth 2 (two) times daily., Disp: , Rfl:  .  clopidogrel (PLAVIX) 75 MG tablet, Take 75 mg by  mouth daily. , Disp: , Rfl:  .  colchicine 0.6 MG tablet, Take 0.6-1.2 mg by mouth See admin instructions. 1.2 mg with flares; take 0.6 mg if still present after one hour, Disp: , Rfl:  .  DEXILANT 30 MG capsule, Take 30 mg by mouth daily., Disp: , Rfl:  .  Fluticasone-Salmeterol (ADVAIR) 250-50 MCG/DOSE AEPB, Inhale 2 puffs into the lungs daily as needed (for flares). , Disp: , Rfl:  .  furosemide (LASIX) 20 MG tablet, Take 20 mg by mouth daily. , Disp: , Rfl:  .  gabapentin (NEURONTIN) 300 MG capsule, Take 300 mg by mouth 2 (two) times daily., Disp: , Rfl:  .  ipratropium-albuterol (DUONEB) 0.5-2.5 (3) MG/3ML SOLN, Take 3  mLs by nebulization every 4 (four) hours as needed., Disp: 360 mL, Rfl: 0 .  LEVEMIR FLEXTOUCH 100 UNIT/ML Pen, Inject 50 Units into the skin at bedtime. (Patient taking differently: Inject into the skin at bedtime. Per pt: 54u at night, and 8u q AM), Disp: 15 mL, Rfl: 1 .  lisinopril (PRINIVIL,ZESTRIL) 40 MG tablet, Take 40 mg by mouth daily. , Disp: , Rfl: 3 .  meloxicam (MOBIC) 15 MG tablet, Take 15 mg by mouth daily., Disp: , Rfl:  .  mupirocin nasal ointment (BACTROBAN) 2 %, Apply in each nostril daily, Disp: 1 g, Rfl: 1 .  ondansetron (ZOFRAN) 4 MG tablet, Take 1 tablet (4 mg total) by mouth every 8 (eight) hours as needed for nausea or vomiting., Disp: 20 tablet, Rfl: 0 .  PROAIR RESPICLICK 132 (90 BASE) MCG/ACT AEPB, Inhale 2 puffs into the lungs every 6 (six) hours as needed (shortness of breath). , Disp: , Rfl: 3 .  tiZANidine (ZANAFLEX) 2 MG tablet, Take 2 mg by mouth 2 (two) times daily., Disp: , Rfl:  .  traMADol (ULTRAM) 50 MG tablet, Take 1 tablet (50 mg total) by mouth every 6 (six) hours as needed., Disp: 20 tablet, Rfl: 0   Allergies  Allergen Reactions  . Hydrocodone-Acetaminophen Itching  . Latex Itching, Rash and Other (See Comments)    RE: "Powdered gloves"      Social History   Occupational History  . Not on file  Tobacco Use  . Smoking status: Former Smoker    Packs/day: 1.50    Years: 34.00    Pack years: 51.00    Last attempt to quit: 09/24/1997    Years since quitting: 21.1  . Smokeless tobacco: Never Used  Substance and Sexual Activity  . Alcohol use: No  . Drug use: No  . Sexual activity: Not on file     Family History  Problem Relation Age of Onset  . Cancer Mother        breast/ had mastecomy  . Cancer Father        colon     Immunization History  Administered Date(s) Administered  . Influenza Whole 08/25/2011     Review of systems: Positive Findings in bold print.  Constitutional:  chills, fatigue, fever, sweats, weight  change Communication: Optometrist, sign Ecologist, hand writing, iPad/Android device Head: headaches, head injury Eyes: changes in vision, eye pain, glaucoma, cataracts, macular degeneration, diplopia, glare,  light sensitivity, eyeglasses or contacts, blindness Ears nose mouth throat: Hard of hearing, ringing in ears, deaf, sign language,  vertigo,   nosebleeds,  rhinitis,  cold sores, snoring, swollen glands Cardiovascular: HTN, edema, arrhythmia, pacemaker in place, defibrillator in place,  chest pain/tightness, chronic anticoagulation, blood clot, heart failure Peripheral Vascular: leg  cramps, varicose veins, blood clots, lymphedema Respiratory:  difficulty breathing, denies congestion, SOB, wheezing, cough, emphysema Gastrointestinal: change in appetite or weight, abdominal pain, constipation, diarrhea, nausea, vomiting, vomiting blood, change in bowel habits, abdominal pain, jaundice, rectal bleeding, hemorrhoids, Genitourinary:  nocturia,  pain on urination,  blood in urine, Foley catheter, urinary urgency Musculoskeletal: uses mobility aid,  cramping, stiff joints, painful joints, decreased joint motion, fractures, OA, gout Skin: +changes in toenails, color change, dryness, itching, mole changes,  rash  Neurological: headaches, numbness in feet, paresthesias in feet, burning in feet, fainting,  seizures, change in speech. denies headaches, memory problems/poor historian, cerebral palsy, weakness, paralysis, stroke Endocrine: diabetes, hypothyroidism, hyperthyroidism,  goiter, dry mouth, flushing, heat intolerance,  cold intolerance,  excessive thirst, denies polyuria,  nocturia Hematological:  easy bleeding, excessive bleeding, easy bruising, enlarged lymph nodes, on long term blood thinner, history of past transusions Allergy/immunological:  hives, eczema, frequent infections, multiple drug allergies, seasonal allergies, transplant recipient Psychiatric:  anxiety, depression, mood  disorder, suicidal ideations, hallucinations   Objective: Vitals:   11/06/18 1407  BP: (!) 162/96  Pulse: 95   Vascular Examination: Capillary refill time immediate x 10 digits Dorsalis pedis and posterior tibial pulses present b/l No digital hair x 10 digits Skin temperature gradient WNL b/l  Dermatological Examination: Skin with normal turgor, texture and tone b/l  Toenails 1-5 b/l discolored, thick, dystrophic with subungual debris and pain with palpation to nailbeds due to thickness of nails.  No open wounds noted bilaterally.  No interdigital macerations noted bilaterally.  Musculoskeletal: Muscle strength 5/5 to all LE muscle groups  Neurological: Sensation intact with 10 gram monofilament  Vibratory sensation intact.  Assessment: 1. Painful onychomycosis toenails 1-5 b/l  2. NIDDM 3. Patient on long-term blood thinner, Plavix   Plan: 1. Discussed onychomycosis and treatment options.  Discussed diabetes and importance of checking blood sugars regularly.  Literature dispensed on today. 2. Toenails 1-5 b/l were debrided in length and girth without iatrogenic bleeding. 3. Patient to continue soft, supportive shoe gear. 4. Patient to report any pedal injuries to medical professional immediately. 5. Follow up 3 months.  6. Patient/POA to call should there be a concern in the interim.

## 2018-11-19 ENCOUNTER — Telehealth (HOSPITAL_COMMUNITY): Payer: Self-pay

## 2018-11-19 NOTE — Telephone Encounter (Signed)
Returned pt's call, no answer, left vm. AW  

## 2018-12-05 ENCOUNTER — Emergency Department (HOSPITAL_BASED_OUTPATIENT_CLINIC_OR_DEPARTMENT_OTHER): Payer: Medicare Other

## 2018-12-05 ENCOUNTER — Other Ambulatory Visit: Payer: Self-pay

## 2018-12-05 ENCOUNTER — Ambulatory Visit (INDEPENDENT_AMBULATORY_CARE_PROVIDER_SITE_OTHER)
Admission: EM | Admit: 2018-12-05 | Discharge: 2018-12-05 | Disposition: A | Discharge status: Home / Self Care | Payer: Medicare Other | Source: Home / Self Care

## 2018-12-05 ENCOUNTER — Encounter (HOSPITAL_COMMUNITY): Payer: Self-pay | Admitting: Emergency Medicine

## 2018-12-05 ENCOUNTER — Encounter (HOSPITAL_COMMUNITY): Payer: Self-pay | Admitting: Internal Medicine

## 2018-12-05 ENCOUNTER — Inpatient Hospital Stay (HOSPITAL_COMMUNITY): Admit: 2018-12-05 | Payer: Medicaid Other

## 2018-12-05 ENCOUNTER — Emergency Department (HOSPITAL_COMMUNITY)
Admission: EM | Admit: 2018-12-05 | Discharge: 2018-12-05 | Disposition: A | Discharge status: Home / Self Care | Payer: Medicare Other | Attending: Emergency Medicine | Admitting: Emergency Medicine

## 2018-12-05 DIAGNOSIS — M79604 Pain in right leg: Secondary | ICD-10-CM

## 2018-12-05 DIAGNOSIS — Z79899 Other long term (current) drug therapy: Secondary | ICD-10-CM | POA: Diagnosis not present

## 2018-12-05 DIAGNOSIS — I82409 Acute embolism and thrombosis of unspecified deep veins of unspecified lower extremity: Secondary | ICD-10-CM | POA: Diagnosis not present

## 2018-12-05 DIAGNOSIS — Z87891 Personal history of nicotine dependence: Secondary | ICD-10-CM | POA: Insufficient documentation

## 2018-12-05 DIAGNOSIS — I82441 Acute embolism and thrombosis of right tibial vein: Secondary | ICD-10-CM | POA: Insufficient documentation

## 2018-12-05 DIAGNOSIS — E119 Type 2 diabetes mellitus without complications: Secondary | ICD-10-CM | POA: Insufficient documentation

## 2018-12-05 DIAGNOSIS — J441 Chronic obstructive pulmonary disease with (acute) exacerbation: Secondary | ICD-10-CM | POA: Diagnosis not present

## 2018-12-05 DIAGNOSIS — I10 Essential (primary) hypertension: Secondary | ICD-10-CM | POA: Insufficient documentation

## 2018-12-05 DIAGNOSIS — L538 Other specified erythematous conditions: Secondary | ICD-10-CM

## 2018-12-05 DIAGNOSIS — L03115 Cellulitis of right lower limb: Secondary | ICD-10-CM

## 2018-12-05 DIAGNOSIS — R52 Pain, unspecified: Secondary | ICD-10-CM

## 2018-12-05 LAB — CBC WITH DIFFERENTIAL/PLATELET
Abs Immature Granulocytes: 0.06 10*3/uL (ref 0.00–0.07)
Basophils Absolute: 0.1 10*3/uL (ref 0.0–0.1)
Basophils Relative: 1 %
EOS PCT: 1 %
Eosinophils Absolute: 0.1 10*3/uL (ref 0.0–0.5)
HCT: 43.9 % (ref 36.0–46.0)
Hemoglobin: 14 g/dL (ref 12.0–15.0)
Immature Granulocytes: 1 %
Lymphocytes Relative: 28 %
Lymphs Abs: 3.2 10*3/uL (ref 0.7–4.0)
MCH: 27.1 pg (ref 26.0–34.0)
MCHC: 31.9 g/dL (ref 30.0–36.0)
MCV: 84.9 fL (ref 80.0–100.0)
Monocytes Absolute: 1.2 10*3/uL — ABNORMAL HIGH (ref 0.1–1.0)
Monocytes Relative: 11 %
NRBC: 0 % (ref 0.0–0.2)
Neutro Abs: 6.7 10*3/uL (ref 1.7–7.7)
Neutrophils Relative %: 58 %
Platelets: 337 10*3/uL (ref 150–400)
RBC: 5.17 MIL/uL — ABNORMAL HIGH (ref 3.87–5.11)
RDW: 12.8 % (ref 11.5–15.5)
WBC: 11.3 10*3/uL — ABNORMAL HIGH (ref 4.0–10.5)

## 2018-12-05 LAB — BASIC METABOLIC PANEL
Anion gap: 12 (ref 5–15)
BUN: 14 mg/dL (ref 8–23)
CO2: 24 mmol/L (ref 22–32)
Calcium: 9.5 mg/dL (ref 8.9–10.3)
Chloride: 104 mmol/L (ref 98–111)
Creatinine, Ser: 1.06 mg/dL — ABNORMAL HIGH (ref 0.44–1.00)
GFR calc non Af Amer: 55 mL/min — ABNORMAL LOW (ref >60)
Glucose, Bld: 151 mg/dL — ABNORMAL HIGH (ref 70–99)
Potassium: 3.6 mmol/L (ref 3.5–5.1)
Sodium: 140 mmol/L (ref 135–145)

## 2018-12-05 LAB — CBG MONITORING, ED: Glucose-Capillary: 175 mg/dL — ABNORMAL HIGH (ref 70–99)

## 2018-12-05 MED ORDER — RIVAROXABAN 15 MG PO TABS
15.0000 mg | ORAL_TABLET | Freq: Once | ORAL | Status: AC
  Administered 2018-12-05: 15 mg via ORAL
  Filled 2018-12-05: qty 1

## 2018-12-05 MED ORDER — RIVAROXABAN (XARELTO) VTE STARTER PACK (15 & 20 MG): ORAL_TABLET | ORAL | 0 refills | Status: DC

## 2018-12-05 MED ORDER — DOXYCYCLINE HYCLATE 100 MG PO CAPS: 100.0000 mg | ORAL_CAPSULE | Freq: Two times a day (BID) | ORAL | 0 refills | Status: AC

## 2018-12-05 NOTE — ED Triage Notes (Signed)
PT C/O: RLE pain onset 6 days associated w/red streak and w/activity  Hx of gout.   DENIES: inj/trauma  TAKING MEDS: none  A&O x4... NAD... Ambulatory

## 2018-12-05 NOTE — ED Provider Notes (Signed)
[facility] EMERGENCY DEPARTMENT Provider Note   CSN: [id number] Arrival date & time: 12/05/18  1005    History   Chief Complaint Chief Complaint  Patient presents with   Leg Pain    HPI Teresa Kerr is a 68 y.o. female with multiple comorbid conditions presenting today for right leg pain, redness and swelling.  Patient reports that 7 days ago she noticed a aching throbbing to her right lower leg which has been constant since that time worsened with palpation and ambulation without alleviating factors.  Patient states that 4 days ago she noticed erythema and slight swelling to her right lower leg that has remained constant since that time.  She was seen at urgent care today who were concerned for DVT and sent to the ED for rule out.  Of note patient reports that she is taking Plavix daily as prescribed and denies missing any doses.     HPI  Past Medical History:  Diagnosis Date   Asthma    Diabetes mellitus    Gout    Hypertension    Neuromuscular disorder ([place])    right sides weakness arm and leg, steroid injections to right shoulder-Dr. Beane(last 8'16)   Stroke ([place]) 2002, 1999   pt has right sided weakness at times, gets ESI when weakness returns   Umbilical hernia    Uterine fibroid     Patient Active Problem List   Diagnosis Date Noted   RBBB 01/31/2017   Elevated troponin 01/31/2017   Influenza 01/16/2017   Anxiety 01/16/2017   Chest pain 01/16/2017   Diabetes mellitus with complication (HCC)    COPD exacerbation (HCC)    Influenza B    Thyroid nodule    Acute respiratory failure with hypoxia (HCC)    Pleural effusion, R, ? hemothorax 04/24/2012   Community acquired pneumonia 04/20/2012   Hypoxia 04/20/2012   Shingles 04/20/2012   Rib pain 04/20/2012   Hypertension 04/20/2012   History of stroke 04/20/2012   Chronic anticoagulation 04/20/2012   Diabetes mellitus ([place]) 04/20/2012   FIBROIDS, UTERUS  05/16/2007   HX, PERSONAL, TOBACCO USE 05/16/2007   GOUT 05/14/2007   OBESITY 05/14/2007   Essential hypertension 05/14/2007   ALLERGIC RHINITIS, SEASONAL 05/14/2007   Asthma 05/14/2007    Past Surgical History:  Procedure Laterality Date   ANGIOPLASTY     x 2 times-cerebral arterial stents- [place]   CYSTOSCOPY WITH RETROGRADE PYELOGRAM, URETEROSCOPY AND STENT PLACEMENT Left 09/01/2015   Procedure: CYSTOSCOPY WITH LEFT RETROGRADE, LEFT URETEROSCOPY AND STENT PLACEMENT;  Surgeon: Irine Seal, MD;  Location: [facility];  Service: Urology;  Laterality: Left;   HAND SURGERY     HOLMIUM LASER APPLICATION Left 11/25/9177   Procedure: WITH HOLMIUM LASER ;  Surgeon: Irine Seal, MD;  Location: [facility];  Service: Urology;  Laterality: Left;   IR ANGIO INTRA EXTRACRAN SEL COM CAROTID INNOMINATE BILAT MOD SED  03/04/2018   IR ANGIO VERTEBRAL SEL SUBCLAVIAN INNOMINATE UNI R MOD SED  03/04/2018   IR ANGIO VERTEBRAL SEL VERTEBRAL UNI L MOD SED  03/04/2018   TOE SURGERY       OB History    Gravida  3   Para  3   Term  3   Preterm      AB      Living  3     SAB      TAB      Ectopic      Multiple  Live Births               Home Medications    Prior to Admission medications   Medication Sig Start Date End Date Taking? Authorizing Provider  acetaminophen (TYLENOL) 325 MG tablet Take 325-650 mg by mouth every 6 (six) hours as needed for mild pain, moderate pain, fever or headache.    [provider]  allopurinol (ZYLOPRIM) 100 MG tablet Take 100 mg by mouth daily. 01/04/17   [provider]  amLODipine (NORVASC) 10 MG tablet Take 10 mg by mouth daily.      [provider]  atorvastatin (LIPITOR) 10 MG tablet Take 10 mg by mouth daily at 6 PM.  01/25/15   [provider]  cloNIDine (CATAPRES) 0.1 MG tablet Take 0.1 mg by mouth 2 (two) times daily. 08/22/15   [provider]  clopidogrel (PLAVIX) 75 MG tablet Take 75 mg by  mouth daily.  05/01/12   [provider]  colchicine 0.6 MG tablet Take 0.6-1.2 mg by mouth See admin instructions. 1.2 mg with flares; take 0.6 mg if still present after one hour    [provider]  DEXILANT 30 MG capsule Take 30 mg by mouth daily. 11/26/16   [provider]  doxycycline (VIBRAMYCIN) 100 MG capsule Take 1 capsule (100 mg total) by mouth 2 (two) times daily for 10 days. 12/05/18 12/15/18  [name], NP  Fluticasone-Salmeterol (ADVAIR) 250-50 MCG/DOSE AEPB Inhale 2 puffs into the lungs daily as needed (for flares).     [provider]  furosemide (LASIX) 20 MG tablet Take 20 mg by mouth daily.     [provider]  gabapentin (NEURONTIN) 300 MG capsule Take 300 mg by mouth 2 (two) times daily.    [provider]  ipratropium-albuterol (DUONEB) 0.5-2.5 (3) MG/3ML SOLN Take 3 mLs by nebulization every 4 (four) hours as needed. 01/18/17   Jani Gravel, MD  LEVEMIR FLEXTOUCH 100 UNIT/ML Pen Inject 50 Units into the skin at bedtime. Patient taking differently: Inject into the skin at bedtime. Per pt: 54u at night, and 8u q AM 01/18/17   Jani Gravel, MD  lisinopril (PRINIVIL,ZESTRIL) 40 MG tablet Take 40 mg by mouth daily.  01/05/15   [provider]  mupirocin nasal ointment (BACTROBAN) 2 % Apply in each nostril daily 01/31/18   Wardell Honour, MD  ondansetron (ZOFRAN) 4 MG tablet Take 1 tablet (4 mg total) by mouth every 8 (eight) hours as needed for nausea or vomiting. 08/09/15   Hoyle Sauer, MD  PROAIR RESPICLICK 952 (90 BASE) MCG/ACT AEPB Inhale 2 puffs into the lungs every 6 (six) hours as needed (shortness of breath).  10/25/14   [provider]  Rivaroxaban 15 & 20 MG TBPK Take as directed on package: Start with one 15mg  tablet by mouth twice a day with food. On Day 22, switch to one 20mg  tablet once a day with food. 12/05/18   Nuala Alpha A, PA-C  tiZANidine (ZANAFLEX) 2 MG tablet Take 2 mg by mouth 2 (two)  times daily. 01/04/17   [provider]  traMADol (ULTRAM) 50 MG tablet Take 1 tablet (50 mg total) by mouth every 6 (six) hours as needed. 09/21/17   Duanne Guess, PA-C    Family History Family History  Problem Relation Age of Onset   Cancer Mother        breast/ had mastecomy   Cancer Father  colon    Social History Social History   Tobacco Use   Smoking status: Former Smoker    Packs/day: 1.50    Years: 34.00    Pack years: 51.00    Last attempt to quit: 09/24/1997    Years since quitting: 21.2   Smokeless tobacco: Never Used  Substance Use Topics   Alcohol use: No   Drug use: No     Allergies   Hydrocodone-acetaminophen and Latex   Review of Systems Review of Systems  Constitutional: Negative.  Negative for chills and fever.  Respiratory: Negative.  Negative for cough and shortness of breath.   Cardiovascular: Negative.  Negative for chest pain.  Gastrointestinal: Negative.  Negative for abdominal pain, nausea and vomiting.  Musculoskeletal: Positive for myalgias (Right lower leg). Negative for arthralgias.  Skin: Positive for color change (Right lower leg). Negative for wound.  Neurological: Negative.  Negative for weakness, numbness and headaches.  All other systems reviewed and are negative.  Physical Exam Updated Vital Signs BP 135/87    Pulse 98    Temp 98.3 F (36.8 C) (Oral)    Ht 5\' 4"  (1.626 m)    Wt 106.6 kg    SpO2 97%    BMI 40.34 kg/m   Physical Exam Constitutional:      General: She is not in acute distress.    Appearance: Normal appearance. She is well-developed. She is obese. She is not ill-appearing or diaphoretic.  HENT:     Head: Normocephalic and atraumatic.     Right Ear: External ear normal.     Left Ear: External ear normal.     Nose: Nose normal.     Mouth/Throat:     Mouth: Mucous membranes are moist.     Pharynx: Oropharynx is clear.  Eyes:     General: Vision grossly intact. Gaze aligned  appropriately.     Extraocular Movements: Extraocular movements intact.     Pupils: Pupils are equal, round, and reactive to light.  Neck:     Musculoskeletal: Normal range of motion.     Trachea: Trachea and phonation normal. No tracheal deviation.  Cardiovascular:     Rate and Rhythm: Normal rate and regular rhythm.     Pulses: Normal pulses.          Dorsalis pedis pulses are 2+ on the right side and 2+ on the left side.       Posterior tibial pulses are 2+ on the right side and 2+ on the left side.     Heart sounds: Normal heart sounds.  Pulmonary:     Effort: Pulmonary effort is normal. No respiratory distress.     Breath sounds: Normal breath sounds.  Abdominal:     General: There is no distension.     Palpations: Abdomen is soft.     Tenderness: There is no abdominal tenderness. There is no guarding or rebound.  Musculoskeletal: Normal range of motion.     Comments: Mild swelling and erythema to right lower leg, erythemic particularly on the anterior tibia.  Positive Homans sign right.  Mild tenderness to palpation of the right gastrocnemius. - Full range of motion and strength at the right knee and right ankle without pain.  Pedal pulses intact and equal bilaterally.  Sensation and capillary refill intact to all toes.  Feet:     Right foot:     Protective Sensation: 5 sites tested. 5 sites sensed.     Left foot:  Protective Sensation: 5 sites tested. 5 sites sensed.  Skin:    General: Skin is warm and dry.     Capillary Refill: Capillary refill takes less than 2 seconds.  Neurological:     Mental Status: She is alert.     GCS: GCS eye subscore is 4. GCS verbal subscore is 5. GCS motor subscore is 6.     Comments: Speech is clear and goal oriented, follows commands Major Cranial nerves without deficit, no facial droop Moves extremities without ataxia, coordination intact  Psychiatric:        Behavior: Behavior normal.    ED Treatments / Results  Labs (all labs  ordered are listed, but only abnormal results are displayed) Labs Reviewed  CBG MONITORING, ED - Abnormal; Notable for the following components:      Result Value   Glucose-Capillary 175 (*)    All other components within normal limits  CBC WITH DIFFERENTIAL/PLATELET  BASIC METABOLIC PANEL    EKG None  Radiology Vas [place] Lower Extremity Venous (dvt) (only [facility])  Result Date: 12/05/2018  Lower Venous Study Indications: Pain, Erythema, and rule out DVT.  Limitations: Body habitus and unable to tolerate compression due to severe pain at lower calf area. Comparison Study: BLEV study on 04/24/2012. Performing Technologist: Rudell Cobb  Examination Guidelines: A complete evaluation includes B-mode imaging, spectral Doppler, color Doppler, and power Doppler as needed of all accessible portions of each vessel. Bilateral testing is considered an integral part of a complete examination. Limited examinations for reoccurring indications may be performed as noted.  Right Venous Findings: +---------+---------------+---------+-----------+----------+-------------------+            Compressibility Phasicity Spontaneity Properties Summary              +---------+---------------+---------+-----------+----------+-------------------+  CFV       Full            Yes       Yes                                         +---------+---------------+---------+-----------+----------+-------------------+  SFJ       Full                                                                  +---------+---------------+---------+-----------+----------+-------------------+  FV Prox   Full                                                                  +---------+---------------+---------+-----------+----------+-------------------+  FV Mid    Full                                                                  +---------+---------------+---------+-----------+----------+-------------------+  FV Distal Full                                                                   +---------+---------------+---------+-----------+----------+-------------------+  PFV       Full                                                                  +---------+---------------+---------+-----------+----------+-------------------+  POP       Full            Yes       Yes                    Poor flow            +---------+---------------+---------+-----------+----------+-------------------+  PTV       None            No        No                     Acute                +---------+---------------+---------+-----------+----------+-------------------+  PERO                                                       not well                                                                         visualized.          +---------+---------------+---------+-----------+----------+-------------------+  Gastroc   Full                      Yes                                         +---------+---------------+---------+-----------+----------+-------------------+  SSV       Partial         No        No                     questionable                                                                     calcified valve vs                                                               chronic thrombosis.  +---------+---------------+---------+-----------+----------+-------------------+ hyperechoic material with calcification in the  lumen of SSV, maybe questionable calcified valve vs chronic thrombosis.  Left Venous Findings: +---+---------------+---------+-----------+----------+-------+      Compressibility Phasicity Spontaneity Properties Summary  +---+---------------+---------+-----------+----------+-------+  CFV Full            Yes       Yes                             +---+---------------+---------+-----------+----------+-------+    Summary: Right: Findings consistent with acute deep vein thrombosis involving the right posterior tibial vein mid to distal segment where erythma seen . Hyperechoic  material with calcification in the lumen of SSV, maybe questionable calcified valve vs chronic thrombosis. No cystic structure found in the popliteal fossa. Left: No evidence of common femoral vein obstruction.  *See table(s) above for measurements and observations.    Preliminary    Procedures Procedures (including critical care time)  Medications Ordered in ED Medications - No data to display   Initial Impression / Assessment and Plan / ED Course  I have reviewed the triage vital signs and the nursing notes.  Pertinent labs & imaging results that were available during my care of the patient were reviewed by me and considered in my medical decision making (see chart for details).  Clinical Course as of Dec 04 1232  Fri Dec 05, 2018  1216 Discussed with Pharmacy Bryson Ha; advises that patient can have both anticoagulants Plavix and Xarelto but advises discussion with Dr. Estanislado Pandy   [BM]  1223 Discussed with Dr. Estanislado Pandy; advises Plavix and Xarelto   [BM]  1233 Discussed with Dr. Stark Jock; will obtain cbc and bmp to check renal function prior to starting dual therapy.   [BM]    Clinical Course User Index [BM] Deliah Boston, PA-C      DVT study positive, she is on Plavix.  Discussed with in-house pharmacist Ebony Hail who advised the patient may start on both Plavix and Xarelto however to discuss case with patient's neuro interventionalists Dr. Edilia Bo.  Case was discussed with neuro interventional list Dr. Estanislado Pandy who agrees the patient may be started on both Xarelto and Plavix at this time for acute DVT.  Case discussed with Dr. Stark Jock will obtain basic blood work prior to initiation of dual therapy.  CBC unremarkable BMP unremarkable CBG 175  Rediscussed case with pharmacist who will be providing patient with pamphlets regarding Xarelto.  Patient informed of care plan and is agreeable.  Discussed risk of bleeding with the patient being on dual therapy, she states understanding, I  have advised patient that if she is to have any injury, particularly head trauma that she must return to emerge department immediately and call 911, she states understanding of increased bleeding risk.  I have informed patient to call her primary care doctor's office today to schedule follow-up appointment regarding her DVT and blood thinning therapies.  Patient reassessed she is ambulating in the hallway requesting immediate discharge.  Patient denies chest pain, shortness of breath, hemoptysis or cough or any additional concerns aside from her right leg pain.  She is overall very well-appearing in no acute distress.  At this time there does not appear to be any evidence of an acute emergency medical condition and the patient appears stable for discharge with appropriate outpatient follow up. Diagnosis was discussed with patient who verbalizes understanding of care plan and is agreeable to discharge. I have discussed return precautions with patient who verbalizes understanding of return precautions. Patient encouraged to  follow-up with their PCP. All questions answered.  Patient was seen and evaluated by Dr. Stark Jock during this visit who agrees with medication changes, plan of care and discharge at this time.  Note: Portions of this report may have been transcribed using voice recognition software. Every effort was made to ensure accuracy; however, inadvertent computerized transcription errors may still be present. Final Clinical Impressions(s) / ED Diagnoses   Final diagnoses:  Acute deep vein thrombosis (DVT) of tibial vein of right lower extremity [facility])    ED Discharge Orders         Ordered    Rivaroxaban 15 & 20 MG TBPK     12/05/18 1230           Gari Crown 12/05/18 1433    Veryl Speak, MD 12/10/18 2330

## 2018-12-05 NOTE — Discharge Instructions (Addendum)
You have been diagnosed today with blood clot of the right leg.  At this time there does not appear to be the presence of an emergent medical condition, however there is always the potential for conditions to change. Please read and follow the below instructions.  Please return to the Emergency Department immediately for any new or worsening symptoms. Please be sure to follow up with your Primary Care Provider within one week regarding your visit today; please call their office today to schedule an appointment even if you are feeling better for a follow-up visit. You have been started on a new blood thinning medication called Xarelto.  Please take this as prescribed.  You may additionally continue to take your Plavix as previously prescribed.  You are at a very high risk of bleeding while taking both of these medications.  If you have any injury or fall you must call 911 and be seen in the emergency department immediately due to your high risk of bleeding.  Get help right away if: You have: New or increased pain, swelling, or redness in an arm or leg. Numbness or tingling in an arm or leg. Shortness of breath. Chest pain. A rapid or irregular heartbeat. A severe headache or confusion. A cut that will not stop bleeding. There is blood in your vomit, stool, or urine. You have a serious fall or accident, or you hit your head. You feel light-headed or dizzy. You cough up blood. Any new or concerning symptoms  Please read the additional information packets attached to your discharge summary.  Do not take your medicine if  develop an itchy rash, swelling in your mouth or lips, or difficulty breathing.

## 2018-12-05 NOTE — ED Provider Notes (Signed)
[place]    CSN: [id number] Arrival date & time: 12/05/18  0827     History   Chief Complaint Chief Complaint  Patient presents with   Leg Pain    HPI Teresa Kerr is a 68 y.o. female.   Micha presents with complaints of right lower leg pain. Started approximately three days ago. Red and painful, tender. Denies any previous similar. No injury. No fever. No numbness or tingling. No weakness. Pain with ambulatory. She is on plavix due to history of strokes. She has diabetes. No skin breakdown. No left leg or foot pain. No cough or shortness of breath . No chest pain  Or palpitations. Hx of asthma, gout, COPD.     ROS per HPI, negative if not otherwise mentioned.      Past Medical History:  Diagnosis Date   Asthma    Diabetes mellitus    Gout    Hypertension    Neuromuscular disorder ([place])    right sides weakness arm and leg, steroid injections to right shoulder-Dr. Beane(last 8'16)   Stroke ([place]) 2002, 1999   pt has right sided weakness at times, gets ESI when weakness returns   Umbilical hernia    Uterine fibroid     Patient Active Problem List   Diagnosis Date Noted   RBBB 01/31/2017   Elevated troponin 01/31/2017   Influenza 01/16/2017   Anxiety 01/16/2017   Chest pain 01/16/2017   Diabetes mellitus with complication (HCC)    COPD exacerbation (HCC)    Influenza B    Thyroid nodule    Acute respiratory failure with hypoxia (HCC)    Pleural effusion, R, ? hemothorax 04/24/2012   Community acquired pneumonia 04/20/2012   Hypoxia 04/20/2012   Shingles 04/20/2012   Rib pain 04/20/2012   Hypertension 04/20/2012   History of stroke 04/20/2012   Chronic anticoagulation 04/20/2012   Diabetes mellitus ([place]) 04/20/2012   FIBROIDS, UTERUS 05/16/2007   HX, PERSONAL, TOBACCO USE 05/16/2007   GOUT 05/14/2007   OBESITY 05/14/2007   Essential hypertension 05/14/2007   ALLERGIC RHINITIS, SEASONAL  05/14/2007   Asthma 05/14/2007    Past Surgical History:  Procedure Laterality Date   ANGIOPLASTY     x 2 times-cerebral arterial stents- [place]   CYSTOSCOPY WITH RETROGRADE PYELOGRAM, URETEROSCOPY AND STENT PLACEMENT Left 09/01/2015   Procedure: CYSTOSCOPY WITH LEFT RETROGRADE, LEFT URETEROSCOPY AND STENT PLACEMENT;  Surgeon: Irine Seal, MD;  Location: [facility];  Service: Urology;  Laterality: Left;   HAND SURGERY     HOLMIUM LASER APPLICATION Left 10/31/2534   Procedure: WITH HOLMIUM LASER ;  Surgeon: Irine Seal, MD;  Location: [facility];  Service: Urology;  Laterality: Left;   IR ANGIO INTRA EXTRACRAN SEL COM CAROTID INNOMINATE BILAT MOD SED  03/04/2018   IR ANGIO VERTEBRAL SEL SUBCLAVIAN INNOMINATE UNI R MOD SED  03/04/2018   IR ANGIO VERTEBRAL SEL VERTEBRAL UNI L MOD SED  03/04/2018   TOE SURGERY      OB History    Gravida  3   Para  3   Term  3   Preterm      AB      Living  3     SAB      TAB      Ectopic      Multiple      Live Births               Home Medications    Prior to Admission medications  Medication Sig Start Date End Date Taking? Authorizing Provider  amLODipine (NORVASC) 10 MG tablet Take 10 mg by mouth daily.     Yes [provider]  atorvastatin (LIPITOR) 10 MG tablet Take 10 mg by mouth daily at 6 PM.  01/25/15  Yes [provider]  cloNIDine (CATAPRES) 0.1 MG tablet Take 0.1 mg by mouth 2 (two) times daily. 08/22/15  Yes [provider]  clopidogrel (PLAVIX) 75 MG tablet Take 75 mg by mouth daily.  05/01/12  Yes [provider]  colchicine 0.6 MG tablet Take 0.6-1.2 mg by mouth See admin instructions. 1.2 mg with flares; take 0.6 mg if still present after one hour   Yes [provider]  DEXILANT 30 MG capsule Take 30 mg by mouth daily. 11/26/16  Yes [provider]  Fluticasone-Salmeterol (ADVAIR) 250-50 MCG/DOSE AEPB Inhale 2 puffs into the lungs daily as needed (for flares).    Yes  [provider]  furosemide (LASIX) 20 MG tablet Take 20 mg by mouth daily.    Yes [provider]  gabapentin (NEURONTIN) 300 MG capsule Take 300 mg by mouth 2 (two) times daily.   Yes [provider]  LEVEMIR FLEXTOUCH 100 UNIT/ML Pen Inject 50 Units into the skin at bedtime. Patient taking differently: Inject into the skin at bedtime. Per pt: 54u at night, and 8u q AM 01/18/17  Yes Jani Gravel, MD  lisinopril (PRINIVIL,ZESTRIL) 40 MG tablet Take 40 mg by mouth daily.  01/05/15  Yes [provider]  PROAIR RESPICLICK 782 (90 BASE) MCG/ACT AEPB Inhale 2 puffs into the lungs every 6 (six) hours as needed (shortness of breath).  10/25/14  Yes [provider]  tiZANidine (ZANAFLEX) 2 MG tablet Take 2 mg by mouth 2 (two) times daily. 01/04/17  Yes [provider]  traMADol (ULTRAM) 50 MG tablet Take 1 tablet (50 mg total) by mouth every 6 (six) hours as needed. 09/21/17  Yes Duanne Guess, PA-C  acetaminophen (TYLENOL) 325 MG tablet Take 325-650 mg by mouth every 6 (six) hours as needed for mild pain, moderate pain, fever or headache.    [provider]  allopurinol (ZYLOPRIM) 100 MG tablet Take 100 mg by mouth daily. 01/04/17   [provider]  doxycycline (VIBRAMYCIN) 100 MG capsule Take 1 capsule (100 mg total) by mouth 2 (two) times daily for 10 days. 12/05/18 12/15/18  Augusto Gamble B, NP  ipratropium-albuterol (DUONEB) 0.5-2.5 (3) MG/3ML SOLN Take 3 mLs by nebulization every 4 (four) hours as needed. 01/18/17   Jani Gravel, MD  mupirocin nasal ointment Drue Stager) 2 % Apply in each nostril daily 01/31/18   Wardell Honour, MD  ondansetron (ZOFRAN) 4 MG tablet Take 1 tablet (4 mg total) by mouth every 8 (eight) hours as needed for nausea or vomiting. 08/09/15   Hoyle Sauer, MD  Rivaroxaban 15 & 20 MG TBPK Take as directed on package: Start with one 15mg  tablet by mouth twice a day with food. On Day 22, switch to one 20mg  tablet  once a day with food. 12/05/18   Deliah Boston, PA-C    Family History Family History  Problem Relation Age of Onset   Cancer Mother        breast/ had mastecomy   Cancer Father        colon    Social History Social History   Tobacco Use   Smoking status: Former Smoker    Packs/day: 1.50    Years:  34.00    Pack years: 51.00    Last attempt to quit: 09/24/1997    Years since quitting: 21.2   Smokeless tobacco: Never Used  Substance Use Topics   Alcohol use: No   Drug use: No     Allergies   Hydrocodone-acetaminophen and Latex   Review of Systems Review of Systems   Physical Exam Triage Vital Signs ED Triage Vitals  Enc Vitals Group     BP 12/05/18 0836 (!) 159/99     Pulse Rate 12/05/18 0836 97     Resp 12/05/18 0836 16     Temp 12/05/18 0836 98.3 F (36.8 C)     Temp Source 12/05/18 0836 Oral     SpO2 12/05/18 0836 98 %     Weight --      Height --      Head Circumference --      Peak Flow --      Pain Score 12/05/18 0837 8     Pain Loc --      Pain Edu? --      Excl. in [place]? --    No data found.  Updated Vital Signs BP (!) 159/99 (BP Location: Left Arm)    Pulse 97    Temp 98.3 F (36.8 C) (Oral)    Resp 16    SpO2 98%   Visual Acuity Right Eye Distance:   Left Eye Distance:   Bilateral Distance:    Right Eye Near:   Left Eye Near:    Bilateral Near:     Physical Exam Constitutional:      General: She is not in acute distress.    Appearance: She is well-developed.  Cardiovascular:     Rate and Rhythm: Normal rate and regular rhythm.     Heart sounds: Normal heart sounds.  Pulmonary:     Effort: Pulmonary effort is normal.     Breath sounds: Normal breath sounds.  Musculoskeletal:     Comments: Right anterior and anterior lateral lower leg with redness and tenderness, mild swelling; approximately 3-4 round raised tender red areas, no fluctuance; positive homans; calf is tender; +2 pedal pulse and gross sensation intact    Skin:    General: Skin is warm and dry.  Neurological:     Mental Status: She is alert and oriented to person, place, and time.      UC Treatments / Results  Labs (all labs ordered are listed, but only abnormal results are displayed) Labs Reviewed - No data to display  EKG None  Radiology Vas [place] Lower Extremity Venous (dvt) (only [facility])  Result Date: 12/05/2018  Lower Venous Study Indications: Pain, Erythema, and rule out DVT.  Limitations: Body habitus and unable to tolerate compression due to severe pain at lower calf area. Comparison Study: BLEV study on 04/24/2012. Performing Technologist: Rudell Cobb  Examination Guidelines: A complete evaluation includes B-mode imaging, spectral Doppler, color Doppler, and power Doppler as needed of all accessible portions of each vessel. Bilateral testing is considered an integral part of a complete examination. Limited examinations for reoccurring indications may be performed as noted.  Right Venous Findings: +---------+---------------+---------+-----------+----------+-------------------+            Compressibility Phasicity Spontaneity Properties Summary              +---------+---------------+---------+-----------+----------+-------------------+  CFV       Full            Yes  Yes                                         +---------+---------------+---------+-----------+----------+-------------------+  SFJ       Full                                                                  +---------+---------------+---------+-----------+----------+-------------------+  FV Prox   Full                                                                  +---------+---------------+---------+-----------+----------+-------------------+  FV Mid    Full                                                                  +---------+---------------+---------+-----------+----------+-------------------+  FV Distal Full                                                                   +---------+---------------+---------+-----------+----------+-------------------+  PFV       Full                                                                  +---------+---------------+---------+-----------+----------+-------------------+  POP       Full            Yes       Yes                    Poor flow            +---------+---------------+---------+-----------+----------+-------------------+  PTV       None            No        No                     Acute                +---------+---------------+---------+-----------+----------+-------------------+  PERO                                                       not well  visualized.          +---------+---------------+---------+-----------+----------+-------------------+  Gastroc   Full                      Yes                                         +---------+---------------+---------+-----------+----------+-------------------+  SSV       Partial         No        No                     questionable                                                                     calcified valve vs                                                               chronic thrombosis.  +---------+---------------+---------+-----------+----------+-------------------+ hyperechoic material with calcification in the lumen of SSV, maybe questionable calcified valve vs chronic thrombosis.  Left Venous Findings: +---+---------------+---------+-----------+----------+-------+      Compressibility Phasicity Spontaneity Properties Summary  +---+---------------+---------+-----------+----------+-------+  CFV Full            Yes       Yes                             +---+---------------+---------+-----------+----------+-------+    Summary: Right: Findings consistent with acute deep vein thrombosis involving the right posterior tibial vein mid to distal segment where erythma seen . Hyperechoic material with  calcification in the lumen of SSV, maybe questionable calcified valve vs chronic thrombosis. No cystic structure found in the popliteal fossa. Left: No evidence of common femoral vein obstruction.  *See table(s) above for measurements and observations.    Preliminary     Procedures Procedures (including critical care time)  Medications Ordered in UC Medications - No data to display  Initial Impression / Assessment and Plan / UC Course  I have reviewed the triage vital signs and the nursing notes.  Pertinent labs & imaging results that were available during my care of the patient were reviewed by me and considered in my medical decision making (see chart for details).    Patient states she has been taking her plavix daily. At time of exam appears more consistent with a cellulitis, possible phlebitis. However, due to patient history and calf pain as well opted to order DVT rule out. At time of concluding this note it appears patient checked into the ER rather than checking in with ultrasound, however evaluation and ultrasound was completed through ER with management provided of DVT.   Final Clinical Impressions(s) / UC Diagnoses   Final diagnoses:  Right leg pain  Cellulitis of right lower extremity     Discharge Instructions     Complete course of antibiotics.  Warm compresses to the area.  We will have you complete a lower leg ultrasound to rule out blood clot.  Please return for any worsening of pain, redness, fevers.    ED Prescriptions    Medication Sig Dispense Auth. Provider   doxycycline (VIBRAMYCIN) 100 MG capsule Take 1 capsule (100 mg total) by mouth 2 (two) times daily for 10 days. 20 capsule [name], NP     Controlled Substance Prescriptions [place] Controlled Substance Registry consulted? Not Applicable   [name], NP 12/05/18 1447

## 2018-12-05 NOTE — ED Triage Notes (Signed)
Pt here for evaluation of right leg pain that started about 1 week ago. Denies injury/trauma. Redness and edema developed Tuesday (3 days ago). Sent here from urgent care for DVT rule out.

## 2018-12-05 NOTE — Discharge Planning (Signed)
Pt has insurance coverage with $3 co-pay.

## 2018-12-05 NOTE — Discharge Instructions (Signed)
Complete course of antibiotics.  Warm compresses to the area.  We will have you complete a lower leg ultrasound to rule out blood clot.  Please return for any worsening of pain, redness, fevers.

## 2018-12-05 NOTE — Progress Notes (Signed)
Right lower extremity venous duplex exam completed. Please see preliminary notes on CV PROC under chart review. Danikah Budzik H Sueanne Maniaci(RDMS RVT) 12/05/18 11:44 AM

## 2018-12-08 ENCOUNTER — Other Ambulatory Visit: Payer: Self-pay | Admitting: Interventional Radiology

## 2019-01-19 ENCOUNTER — Other Ambulatory Visit: Payer: Self-pay | Admitting: Nephrology

## 2019-01-19 DIAGNOSIS — Z87442 Personal history of urinary calculi: Principal | ICD-10-CM

## 2019-01-19 DIAGNOSIS — R109 Unspecified abdominal pain: Secondary | ICD-10-CM

## 2019-01-27 ENCOUNTER — Inpatient Hospital Stay: Admission: RE | Admit: 2019-01-27 | Payer: Medicare Other | Source: Ambulatory Visit

## 2019-02-03 ENCOUNTER — Ambulatory Visit
Admission: RE | Admit: 2019-02-03 | Discharge: 2019-02-03 | Disposition: A | Discharge status: Home / Self Care | Payer: Medicare Other | Source: Ambulatory Visit | Attending: Nephrology | Admitting: Nephrology

## 2019-02-03 DIAGNOSIS — Z87442 Personal history of urinary calculi: Secondary | ICD-10-CM

## 2019-02-03 DIAGNOSIS — R109 Unspecified abdominal pain: Secondary | ICD-10-CM

## 2019-02-05 ENCOUNTER — Ambulatory Visit: Payer: Medicare Other | Admitting: Podiatry

## 2019-03-02 ENCOUNTER — Ambulatory Visit: Payer: Medicare Other | Admitting: Podiatry

## 2019-04-03 ENCOUNTER — Ambulatory Visit: Payer: Medicare Other | Admitting: Podiatry

## 2019-04-24 ENCOUNTER — Ambulatory Visit: Payer: Medicare Other | Admitting: Podiatry

## 2019-05-01 ENCOUNTER — Ambulatory Visit: Payer: Medicare Other | Admitting: Podiatry

## 2019-05-19 ENCOUNTER — Ambulatory Visit: Payer: Medicare Other | Admitting: Podiatry

## 2019-05-27 ENCOUNTER — Other Ambulatory Visit (HOSPITAL_COMMUNITY): Payer: Self-pay | Admitting: Interventional Radiology

## 2019-05-27 DIAGNOSIS — I771 Stricture of artery: Secondary | ICD-10-CM

## 2019-06-11 ENCOUNTER — Telehealth: Payer: Self-pay | Admitting: Student

## 2019-06-11 DIAGNOSIS — Z8673 Personal history of transient ischemic attack (TIA), and cerebral infarction without residual deficits: Secondary | ICD-10-CM

## 2019-06-11 MED ORDER — LORAZEPAM 1 MG PO TABS: 1.0000 mg | ORAL_TABLET | Freq: Once | ORAL | 0 refills | Status: AC

## 2019-06-11 NOTE — Telephone Encounter (Signed)
-----   Message from Danielle Dess sent at 06/11/2019 12:02 PM EDT ----- Regarding: Gaspar Cola,   Ms. Eales is scheduled for her mri on 06/17/19. She is claustrophobic and needs meds for anxiety. Can you please call this in. [facility] uses [facility] ([place] listed in her chart.  Thanks,  Lia Foyer

## 2019-06-11 NOTE — Telephone Encounter (Signed)
Ativan 1 mg called into patient's preferred pharmacy ([facility]) for upcoming MRI 9/23.  Administration instructions provided to patient who voices understanding.   Brynda Greathouse, MS RD PA-C

## 2019-06-17 ENCOUNTER — Other Ambulatory Visit: Payer: Self-pay

## 2019-06-17 ENCOUNTER — Ambulatory Visit (HOSPITAL_COMMUNITY): Payer: Medicare Other

## 2019-06-17 ENCOUNTER — Ambulatory Visit (HOSPITAL_COMMUNITY)
Admission: RE | Admit: 2019-06-17 | Discharge: 2019-06-17 | Disposition: A | Discharge status: Home / Self Care | Payer: Medicare Other | Source: Ambulatory Visit | Attending: Interventional Radiology | Admitting: Interventional Radiology

## 2019-06-17 DIAGNOSIS — I672 Cerebral atherosclerosis: Secondary | ICD-10-CM | POA: Insufficient documentation

## 2019-06-17 DIAGNOSIS — I771 Stricture of artery: Secondary | ICD-10-CM

## 2019-06-17 DIAGNOSIS — I6622 Occlusion and stenosis of left posterior cerebral artery: Secondary | ICD-10-CM | POA: Diagnosis not present

## 2019-06-24 ENCOUNTER — Telehealth (HOSPITAL_COMMUNITY): Payer: Self-pay

## 2019-06-24 NOTE — Telephone Encounter (Signed)
Pt agreed to f/u in 6 months with mri/mra head wo. AW 

## 2019-07-21 ENCOUNTER — Encounter: Payer: Self-pay | Admitting: Podiatry

## 2019-07-21 ENCOUNTER — Ambulatory Visit (INDEPENDENT_AMBULATORY_CARE_PROVIDER_SITE_OTHER): Payer: Medicare Other | Admitting: Podiatry

## 2019-07-21 ENCOUNTER — Other Ambulatory Visit: Payer: Self-pay

## 2019-07-21 DIAGNOSIS — L600 Ingrowing nail: Secondary | ICD-10-CM

## 2019-07-21 DIAGNOSIS — M79675 Pain in left toe(s): Secondary | ICD-10-CM | POA: Diagnosis not present

## 2019-07-21 DIAGNOSIS — M79674 Pain in right toe(s): Secondary | ICD-10-CM

## 2019-07-21 DIAGNOSIS — B351 Tinea unguium: Secondary | ICD-10-CM | POA: Diagnosis not present

## 2019-07-21 DIAGNOSIS — Z9229 Personal history of other drug therapy: Secondary | ICD-10-CM

## 2019-07-21 NOTE — Patient Instructions (Addendum)
EPSOM SALT FOOT SOAK INSTRUCTIONS  1.  Place 1/4 cup of epsom salts in 2 quarts of warm tap water. IF YOU ARE DIABETIC, OR HAVE NEUROPATHY,  CHECK THE TEMPERATURE OF THE WATER WITH YOUR ELBOW.  2.  Submerge your foot/feet in the solution and soak for 10-15 minutes.      3.  Next, remove your foot or feet from solution, blot dry the affected area.    4.  Apply antibiotic ointment and cover with fabric band-aid .  5.  This soak should be done once a day for 5 days.   6.  Monitor for any signs/symptoms of infection such as redness, swelling, odor, drainage, increased pain, or non-healing of digit.   7.  Please do not hesitate to call the office and speak to a Nurse or Doctor if you have questions.   8.  If you experience fever, chills, nightsweats, nausea or vomiting with worsening of digit, please go to the emergency room.    Diabetes Mellitus and Foot Care Foot care is an important part of your health, especially when you have diabetes. Diabetes may cause you to have problems because of poor blood flow (circulation) to your feet and legs, which can cause your skin to:  Become thinner and drier.  Break more easily.  Heal more slowly.  Peel and crack. You may also have nerve damage (neuropathy) in your legs and feet, causing decreased feeling in them. This means that you may not notice minor injuries to your feet that could lead to more serious problems. Noticing and addressing any potential problems early is the best way to prevent future foot problems. How to care for your feet Foot hygiene  Wash your feet daily with warm water and mild soap. Do not use hot water. Then, pat your feet and the areas between your toes until they are completely dry. Do not soak your feet as this can dry your skin.  Trim your toenails straight across. Do not dig under them or around the cuticle. File the edges of your nails with an emery board or nail file.  Apply a moisturizing lotion or petroleum  jelly to the skin on your feet and to dry, brittle toenails. Use lotion that does not contain alcohol and is unscented. Do not apply lotion between your toes. Shoes and socks  Wear clean socks or stockings every day. Make sure they are not too tight. Do not wear knee-high stockings since they may decrease blood flow to your legs.  Wear shoes that fit properly and have enough cushioning. Always look in your shoes before you put them on to be sure there are no objects inside.  To break in new shoes, wear them for just a few hours a day. This prevents injuries on your feet. Wounds, scrapes, corns, and calluses  Check your feet daily for blisters, cuts, bruises, sores, and redness. If you cannot see the bottom of your feet, use a mirror or ask someone for help.  Do not cut corns or calluses or try to remove them with medicine.  If you find a minor scrape, cut, or break in the skin on your feet, keep it and the skin around it clean and dry. You may clean these areas with mild soap and water. Do not clean the area with peroxide, alcohol, or iodine.  If you have a wound, scrape, corn, or callus on your foot, look at it several times a day to make sure it is healing and  not infected. Check for: ? Redness, swelling, or pain. ? Fluid or blood. ? Warmth. ? Pus or a bad smell. General instructions  Do not cross your legs. This may decrease blood flow to your feet.  Do not use heating pads or hot water bottles on your feet. They may burn your skin. If you have lost feeling in your feet or legs, you may not know this is happening until it is too late.  Protect your feet from hot and cold by wearing shoes, such as at the beach or on hot pavement.  Schedule a complete foot exam at least once a year (annually) or more often if you have foot problems. If you have foot problems, report any cuts, sores, or bruises to your health care provider immediately. Contact a health care provider if:  You have a  medical condition that increases your risk of infection and you have any cuts, sores, or bruises on your feet.  You have an injury that is not healing.  You have redness on your legs or feet.  You feel burning or tingling in your legs or feet.  You have pain or cramps in your legs and feet.  Your legs or feet are numb.  Your feet always feel cold.  You have pain around a toenail. Get help right away if:  You have a wound, scrape, corn, or callus on your foot and: ? You have pain, swelling, or redness that gets worse. ? You have fluid or blood coming from the wound, scrape, corn, or callus. ? Your wound, scrape, corn, or callus feels warm to the touch. ? You have pus or a bad smell coming from the wound, scrape, corn, or callus. ? You have a fever. ? You have a red line going up your leg. Summary  Check your feet every day for cuts, sores, red spots, swelling, and blisters.  Moisturize feet and legs daily.  Wear shoes that fit properly and have enough cushioning.  If you have foot problems, report any cuts, sores, or bruises to your health care provider immediately.  Schedule a complete foot exam at least once a year (annually) or more often if you have foot problems. This information is not intended to replace advice given to you by your health care provider. Make sure you discuss any questions you have with your health care provider. Document Released: 09/07/2000 Document Revised: 10/23/2017 Document Reviewed: 10/12/2016 Elsevier Patient Education  2020 [employer].

## 2019-07-23 NOTE — Progress Notes (Signed)
Subjective: Teresa Kerr is seen today for follow up painful, elongated, thickened toenails 1-5 b/l feet that she cannot cut. Pain interferes with daily activities. Aggravating factor includes wearing enclosed shoe gear and relieved with periodic debridement.   Pt c/o painful left hallux on today. She denies any redness, drainage or swelling.  Current Outpatient Medications on File Prior to Visit  Medication Sig  . acetaminophen (TYLENOL) 325 MG tablet Take 325-650 mg by mouth every 6 (six) hours as needed for mild pain, moderate pain, fever or headache.  . allopurinol (ZYLOPRIM) 100 MG tablet Take 100 mg by mouth daily.  Marland Kitchen amLODipine (NORVASC) 10 MG tablet Take 10 mg by mouth daily.    Marland Kitchen atorvastatin (LIPITOR) 10 MG tablet Take 10 mg by mouth daily at 6 PM.   . azithromycin (ZITHROMAX) 250 MG tablet azithromycin 250 mg tablet  . cloNIDine (CATAPRES) 0.1 MG tablet Take 0.1 mg by mouth 2 (two) times daily.  . clopidogrel (PLAVIX) 75 MG tablet Take 75 mg by mouth daily.   . colchicine 0.6 MG tablet Take 0.6-1.2 mg by mouth See admin instructions. 1.2 mg with flares; take 0.6 mg if still present after one hour  . cyclobenzaprine (FLEXERIL) 5 MG tablet cyclobenzaprine 5 mg tablet  TAKE 1 TABLET BY MOUTH 2 TIMES DAILY  . DEXILANT 30 MG capsule Take 30 mg by mouth daily.  . diazepam (VALIUM) 5 MG tablet diazepam 5 mg tablet  TAKE 1 OR 2 TABLETS BY MOUTH 1 HOUR PRIOR TO PROCEDURE  . Fluticasone-Salmeterol (ADVAIR) 250-50 MCG/DOSE AEPB Inhale 2 puffs into the lungs daily as needed (for flares).   . furosemide (LASIX) 20 MG tablet Take 20 mg by mouth daily.   Marland Kitchen gabapentin (NEURONTIN) 300 MG capsule Take 300 mg by mouth 2 (two) times daily.  Marland Kitchen ipratropium-albuterol (DUONEB) 0.5-2.5 (3) MG/3ML SOLN Take 3 mLs by nebulization every 4 (four) hours as needed.  Marland Kitchen JANUVIA 100 MG tablet Take 100 mg by mouth daily.  Marland Kitchen LEVEMIR FLEXTOUCH 100 UNIT/ML Pen Inject 50 Units into the skin at bedtime. (Patient  taking differently: Inject into the skin at bedtime. Per pt: 54u at night, and 8u q AM)  . lisinopril (PRINIVIL,ZESTRIL) 40 MG tablet Take 40 mg by mouth daily.   Marland Kitchen LORazepam (ATIVAN) 1 MG tablet 1 TABLET TO BE TAKEN AT OFFICE VISIT ON 9/23. MUST HAVE SOMEONE TO DRIVE FOLLOWING APPOINTMENT  . losartan (COZAAR) 50 MG tablet Take 50 mg by mouth daily.  . mupirocin nasal ointment (BACTROBAN) 2 % Apply in each nostril daily  . ondansetron (ZOFRAN) 4 MG tablet Take 1 tablet (4 mg total) by mouth every 8 (eight) hours as needed for nausea or vomiting.  Marland Kitchen PROAIR RESPICLICK 123XX123 (90 BASE) MCG/ACT AEPB Inhale 2 puffs into the lungs every 6 (six) hours as needed (shortness of breath).   . ranitidine (ZANTAC) 150 MG tablet ranitidine 150 mg tablet  . Rivaroxaban 15 & 20 MG TBPK Take as directed on package: Start with one 15mg  tablet by mouth twice a day with food. On Day 22, switch to one 20mg  tablet once a day with food.  . terconazole (TERAZOL 7) 0.4 % vaginal cream terconazole 0.4 % vaginal cream  Insert 1 application vaginally at bedtime for 3 nights  . tiZANidine (ZANAFLEX) 2 MG tablet Take 2 mg by mouth 2 (two) times daily.  . traMADol (ULTRAM) 50 MG tablet Take 1 tablet (50 mg total) by mouth every 6 (six) hours as needed.  No current facility-administered medications on file prior to visit.      Allergies  Allergen Reactions  . Hydrocodone-Acetaminophen Itching  . Latex Itching, Rash and Other (See Comments)    RE: "Powdered gloves"      Objective:  Vascular Examination: Capillary refill time immediate x 10 digits.  Dorsalis pedis present b/l.  Posterior tibial pulses present b/l.  Digital hair  present x 10 digits.  Skin temperature gradient WNL b/l.   Dermatological Examination: Skin with normal turgor, texture and tone b/l  Toenails 1-5 b/l discolored, thick, dystrophic with subungual debris and pain with palpation to nailbeds due to thickness of nails.  Incurvated nailplate  left great toe medial border with tenderness to palpation. No erythema, no edema, no drainage noted.  Musculoskeletal: Muscle strength 5/5 to all LE muscle groups  No gross bony deformities b/l.  No pain, crepitus or joint limitation noted with ROM.   Neurological Examination: Protective sensation intact with 10 gram monofilament bilaterally.  Epicritic sensation present bilaterally.  Vibratory sensation intact bilaterally.   Assessment: Painful onychomycosis toenails 1-5 b/l  Ingrown toenail left hallux NIDDM  Plan: 1. Toenails 1-5 b/l were debrided in length and girth without iatrogenic bleeding. Offending nail border debrided and curretaged left hallux. Border cleansed with alcohol and triple antibiotic applied. Patient given written instructions for epsom salt soaks once daily for 5 days.  Call office if condition does not resolve. 2. Patient to continue soft, supportive shoe gear daily.  3. Patient to report any pedal injuries to medical professional immediately. 4. Follow up 3 months.  5. Patient/POA to call should there be a concern in the interim.

## 2019-09-09 DIAGNOSIS — M7541 Impingement syndrome of right shoulder: Secondary | ICD-10-CM | POA: Insufficient documentation

## 2019-10-26 ENCOUNTER — Ambulatory Visit: Payer: Medicare Other | Admitting: Podiatry

## 2019-12-23 ENCOUNTER — Other Ambulatory Visit (HOSPITAL_COMMUNITY): Payer: Self-pay | Admitting: Interventional Radiology

## 2019-12-23 ENCOUNTER — Other Ambulatory Visit (HOSPITAL_COMMUNITY): Payer: Self-pay | Admitting: Student

## 2019-12-23 DIAGNOSIS — I771 Stricture of artery: Secondary | ICD-10-CM

## 2019-12-23 MED ORDER — DIAZEPAM 5 MG PO TABS: 5.0000 mg | ORAL_TABLET | Freq: Once | ORAL | 0 refills | Status: AC

## 2020-01-19 ENCOUNTER — Other Ambulatory Visit (HOSPITAL_COMMUNITY): Payer: Self-pay | Admitting: Student

## 2020-01-19 MED ORDER — LORAZEPAM 1 MG PO TABS: 1.0000 mg | ORAL_TABLET | Freq: Once | ORAL | 0 refills | Status: AC

## 2020-01-20 ENCOUNTER — Ambulatory Visit (HOSPITAL_COMMUNITY): Payer: Medicare Other

## 2020-01-20 ENCOUNTER — Other Ambulatory Visit: Payer: Self-pay

## 2020-01-20 ENCOUNTER — Ambulatory Visit (HOSPITAL_COMMUNITY)
Admission: RE | Admit: 2020-01-20 | Discharge: 2020-01-20 | Disposition: A | Discharge status: Home / Self Care | Payer: Medicare Other | Source: Ambulatory Visit | Attending: Interventional Radiology | Admitting: Interventional Radiology

## 2020-01-20 DIAGNOSIS — I6601 Occlusion and stenosis of right middle cerebral artery: Secondary | ICD-10-CM | POA: Insufficient documentation

## 2020-01-20 DIAGNOSIS — I771 Stricture of artery: Secondary | ICD-10-CM

## 2020-01-25 ENCOUNTER — Other Ambulatory Visit (HOSPITAL_COMMUNITY): Payer: Self-pay | Admitting: Interventional Radiology

## 2020-01-25 ENCOUNTER — Telehealth (HOSPITAL_COMMUNITY): Payer: Self-pay

## 2020-01-25 DIAGNOSIS — I771 Stricture of artery: Secondary | ICD-10-CM

## 2020-01-25 NOTE — Telephone Encounter (Signed)
Called to schedule consult, no answer, left vm. AW  

## 2020-02-04 ENCOUNTER — Ambulatory Visit (HOSPITAL_COMMUNITY): Admission: RE | Admit: 2020-02-04 | Payer: Medicare Other | Source: Ambulatory Visit

## 2020-02-15 ENCOUNTER — Ambulatory Visit (HOSPITAL_COMMUNITY)
Admission: RE | Admit: 2020-02-15 | Discharge: 2020-02-15 | Disposition: A | Discharge status: Home / Self Care | Payer: Medicare Other | Source: Ambulatory Visit | Attending: Interventional Radiology | Admitting: Interventional Radiology

## 2020-02-15 ENCOUNTER — Other Ambulatory Visit: Payer: Self-pay

## 2020-02-15 DIAGNOSIS — I771 Stricture of artery: Secondary | ICD-10-CM

## 2020-02-15 NOTE — Progress Notes (Signed)
Chief Complaint: Patient was seen in consultation today for imaging follow-up.  Referring Physician(s): Kathrynn Ducking  Supervising Physician: Luanne Bras  Patient Status: [facility] - Out-pt  History of Present Illness: Teresa Kerr is a 69 y.o. 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 She is known to [facility] and has been followed by Dr. Estanislado Pandy since 08/2000. She first presented to our department at the request of Dr. Jannifer Franklin for management of left MCA stenosis, at that time thought to be cause of recent CVA. She underwent an image-guided cerebral arteriogram with revascularization of left MCA M1 stenosis using balloon angioplasty 09/06/2000 by Dr. Estanislado Pandy. Following this, patient had another CVA secondary to persistent left MCA M1 stenosis, and patient underwent another image-guided cerebral arteriogram with revascularization of left MCA M1 stenosis using balloon angioplasty 03/10/2001 by Dr. Estanislado Pandy. She has since been followed by routine imaging scans and diagnostic cerebral arteriograms to monitor for changes.   MR/MRA brain/head 01/20/2020: 1. Motion degraded exam. 2. Probable at least moderate stenoses involving the cavernous left ICA as above, progressed from previous. 3. Severe stenosis at the right MCA bifurcation, stable. Moderate diffuse stenosis involving the proximal left M2 inferior division, mildly progressed. 4. Severe left P2 and distal right P3 stenoses, similar to previous.  Patient presents today for follow-up to discuss findings of recent MR/MRA brain/head. Patient awake and alert sitting in chair with no complaints at this time. Denies headache, weakness, numbness/tingling, dizziness, vision changes, hearing changes, tinnitus, or speech difficulty.   Past Medical History:   Diagnosis Date  . Asthma   . Diabetes mellitus   . Gout   . Hypertension   . Neuromuscular disorder ([place])    right sides weakness arm and leg, steroid injections to right shoulder-Dr. Beane(last 8'16)  . Stroke [facility]) 2002, 1999   pt has right sided weakness at times, gets ESI when weakness returns  . Umbilical hernia   . Uterine fibroid     Past Surgical History:  Procedure Laterality Date  . ANGIOPLASTY     x 2 times-cerebral arterial stents- [place]  . CYSTOSCOPY WITH RETROGRADE PYELOGRAM, URETEROSCOPY AND STENT PLACEMENT Left 09/01/2015   Procedure: CYSTOSCOPY WITH LEFT RETROGRADE, LEFT URETEROSCOPY AND STENT PLACEMENT;  Surgeon: Irine Seal, MD;  Location: [facility] ORS;  Service: Urology;  Laterality: Left;  . HAND SURGERY    . HOLMIUM LASER APPLICATION Left 0000000   Procedure: WITH HOLMIUM LASER ;  Surgeon: Irine Seal, MD;  Location: [facility] ORS;  Service: Urology;  Laterality: Left;  . IR ANGIO INTRA EXTRACRAN SEL COM CAROTID INNOMINATE BILAT MOD SED  03/04/2018  . IR ANGIO VERTEBRAL SEL SUBCLAVIAN INNOMINATE UNI R MOD SED  03/04/2018  . IR ANGIO VERTEBRAL SEL VERTEBRAL UNI L MOD SED  03/04/2018  . TOE SURGERY      Allergies: Hydrocodone-acetaminophen and Latex  Medications: Prior to Admission medications   Medication Sig Start Date End Date Taking? Authorizing Provider  acetaminophen (TYLENOL) 325 MG tablet Take 325-650 mg by mouth every 6 (six) hours as needed for mild pain, moderate pain, fever or headache.    [provider]  allopurinol (ZYLOPRIM) 100 MG tablet Take 100 mg by mouth daily. 01/04/17   [provider]  amLODipine (NORVASC) 10 MG tablet Take 10 mg by mouth daily.      [provider]  atorvastatin (  LIPITOR) 10 MG tablet Take 10 mg by mouth daily at 6 PM.  01/25/15   [provider]  azithromycin (ZITHROMAX) 250 MG tablet azithromycin 250 mg tablet    [provider]  cloNIDine (CATAPRES) 0.1 MG tablet Take 0.1 mg by mouth  2 (two) times daily. 08/22/15   [provider]  clopidogrel (PLAVIX) 75 MG tablet Take 75 mg by mouth daily.  05/01/12   [provider]  colchicine 0.6 MG tablet Take 0.6-1.2 mg by mouth See admin instructions. 1.2 mg with flares; take 0.6 mg if still present after one hour    [provider]  cyclobenzaprine (FLEXERIL) 5 MG tablet cyclobenzaprine 5 mg tablet  TAKE 1 TABLET BY MOUTH 2 TIMES DAILY    [provider]  DEXILANT 30 MG capsule Take 30 mg by mouth daily. 11/26/16   [provider]  diazepam (VALIUM) 5 MG tablet diazepam 5 mg tablet  TAKE 1 OR 2 TABLETS BY MOUTH 1 HOUR PRIOR TO PROCEDURE    [provider]  Fluticasone-Salmeterol (ADVAIR) 250-50 MCG/DOSE AEPB Inhale 2 puffs into the lungs daily as needed (for flares).     [provider]  furosemide (LASIX) 20 MG tablet Take 20 mg by mouth daily.     [provider]  gabapentin (NEURONTIN) 300 MG capsule Take 300 mg by mouth 2 (two) times daily.    [provider]  ipratropium-albuterol (DUONEB) 0.5-2.5 (3) MG/3ML SOLN Take 3 mLs by nebulization every 4 (four) hours as needed. 01/18/17   Jani Gravel, MD  JANUVIA 100 MG tablet Take 100 mg by mouth daily. 05/19/19   [provider]  LEVEMIR FLEXTOUCH 100 UNIT/ML Pen Inject 50 Units into the skin at bedtime. Patient taking differently: Inject into the skin at bedtime. Per pt: 54u at night, and 8u q AM 01/18/17   Jani Gravel, MD  lisinopril (PRINIVIL,ZESTRIL) 40 MG tablet Take 40 mg by mouth daily.  01/05/15   [provider]  LORazepam (ATIVAN) 1 MG tablet 1 TABLET TO BE TAKEN AT OFFICE VISIT ON 9/23. MUST HAVE SOMEONE TO DRIVE FOLLOWING APPOINTMENT 06/11/19   [provider]  losartan (COZAAR) 50 MG tablet Take 50 mg by mouth daily. 06/02/19   [provider]  mupirocin nasal ointment (BACTROBAN) 2 % Apply in each nostril daily 01/31/18   Wardell Honour, MD  ondansetron (ZOFRAN) 4 MG  tablet Take 1 tablet (4 mg total) by mouth every 8 (eight) hours as needed for nausea or vomiting. 08/09/15   Hoyle Sauer, MD  PROAIR RESPICLICK [id number] (90 BASE) MCG/ACT AEPB Inhale 2 puffs into the lungs every 6 (six) hours as needed (shortness of breath).  10/25/14   [provider]  ranitidine (ZANTAC) 150 MG tablet ranitidine 150 mg tablet    [provider]  Rivaroxaban 15 & 20 MG TBPK Take as directed on package: Start with one 15mg  tablet by mouth twice a day with food. On Day 22, switch to one 20mg  tablet once a day with food. 12/05/18   Nuala Alpha A, PA-C  terconazole (TERAZOL 7) 0.4 % vaginal cream terconazole 0.4 % vaginal cream  Insert 1 application vaginally at bedtime for 3 nights    [provider]  tiZANidine (ZANAFLEX) 2 MG tablet Take 2 mg by mouth 2 (two) times daily. 01/04/17   [provider]  traMADol (ULTRAM) 50 MG tablet Take 1 tablet (50 mg total) by mouth every 6 (six) hours as needed. 09/21/17  Duanne Guess, PA-C     Family History  Problem Relation Age of Onset  . Cancer Mother        breast/ had mastecomy  . Cancer Father        colon    Social History   Socioeconomic History  . Marital status: Widowed    Spouse name: Not on file  . Number of children: 3  . Years of education: Not on file  . Highest education level: Not on file  Occupational History  . Not on file  Tobacco Use  . Smoking status: Former Smoker    Packs/day: 1.50    Years: 34.00    Pack years: 51.00    Quit date: 09/24/1997    Years since quitting: 22.4  . Smokeless tobacco: Never Used  Substance and Sexual Activity  . Alcohol use: No  . Drug use: No  . Sexual activity: Not on file  Other Topics Concern  . Not on file  Social History Narrative  . Not on file   Social Determinants of Health   Financial Resource Strain:   . Difficulty of Paying Living Expenses:   Food Insecurity:   . Worried About Charity fundraiser in the Last Year:    . Arboriculturist in the Last Year:   Transportation Needs:   . Film/video editor (Medical):   Marland Kitchen Lack of Transportation (Non-Medical):   Physical Activity:   . Days of Exercise per Week:   . Minutes of Exercise per Session:   Stress:   . Feeling of Stress :   Social Connections:   . Frequency of Communication with Friends and Family:   . Frequency of Social Gatherings with Friends and Family:   . Attends Religious Services:   . Active Member of Clubs or Organizations:   . Attends Archivist Meetings:   Marland Kitchen Marital Status:      Review of Systems: A 12 point ROS discussed and pertinent positives are indicated in the HPI above.  All other systems are negative.  Review of Systems  Constitutional: Negative for chills and fever.  HENT: Negative for hearing loss and tinnitus.   Eyes: Negative for visual disturbance.  Neurological: Negative for dizziness, speech difficulty, weakness, numbness and headaches.  Psychiatric/Behavioral: Negative for behavioral problems and confusion.    Vital Signs: There were no vitals taken for this visit.  Physical Exam Constitutional:      General: She is not in acute distress.    Appearance: Normal appearance.  Pulmonary:     Effort: Pulmonary effort is normal. No respiratory distress.  Skin:    General: Skin is warm and dry.  Neurological:     Mental Status: She is alert and oriented to person, place, and time.  Psychiatric:        Mood and Affect: Mood normal.        Behavior: Behavior normal.      Imaging: MR ANGIO HEAD WO CONTRAST  Result Date: 01/21/2020 CLINICAL DATA:  Follow-up examination for intracranial atherosclerotic disease. History of left MCA angioplasty for stenosis. EXAM: MRI HEAD WITHOUT CONTRAST MRA HEAD WITHOUT CONTRAST TECHNIQUE: Multiplanar, multiecho pulse sequences of the brain and surrounding structures were obtained without intravenous contrast. Angiographic images of the head were obtained using  MRA technique without contrast. COMPARISON:  Previous MRI from 06/17/2019 FINDINGS: MRI HEAD FINDINGS Brain: Mild age-related cerebral atrophy, stable. Patchy T2/FLAIR hyperintensity within the periventricular and deep white matter both cerebral hemispheres  most consistent with chronic small vessel ischemic disease, mild to moderate in nature, also unchanged. Scattered areas of encephalomalacia and gliosis involving the left frontal and parietal lobes compatible with chronic left MCA territory infarct, unchanged. Few tiny remote cerebellar infarcts noted as well. No abnormal foci of restricted diffusion to suggest acute or subacute ischemia. Gray-white matter differentiation maintained elsewhere. No other areas of chronic cortical infarction. Small chronic microhemorrhage noted at the right occipital lobe, stable. No other evidence for acute or chronic intracranial hemorrhage. No mass lesion, midline shift or mass effect. No hydrocephalus or extra-axial fluid collection. Empty sella noted, stable. Midline structures intact. Vascular: Major intracranial vascular flow voids are maintained. Skull and upper cervical spine: Craniocervical junction within normal limits. Bone marrow signal intensity normal. No scalp soft tissue abnormality. Sinuses/Orbits: Globes and orbital soft tissues demonstrate no acute finding. Paranasal sinuses are clear. Trace bilateral mastoid effusions noted, of doubtful significance. Inner ear structures grossly normal. Other: None. MRA HEAD FINDINGS ANTERIOR CIRCULATION: Examination degraded by motion artifact. Visualized distal cervical segments of the internal carotid arteries are patent with symmetric antegrade flow. Distal cervical left ICA tortuous. A short-segment stenosis of up to approximately 35% present at the cervical-petrous junction on the left (series 18, image 50). Distally, there is focal signal loss at the proximal cavernous left ICA (series 18, image 74). There is an  underlying mild stenosis at this level on previous exam, raising the possibility for a worsened moderate to severe stenosis on today's study. Additional possible short-segment mild-to-moderate stenosis just distally within the cavernous left ICA (series 18, image 90). Left ICA otherwise patent to the terminus. On the right, the petrous right ICA is widely patent. Mild narrowing at the supraclinoid right ICA again noted, stable. A1 segments remain patent. Grossly normal anterior communicating artery complex. Anterior cerebral arteries patent to their distal aspects without stenosis. M1 segments remain patent. Severe stenosis at the right MCA bifurcation again noted, stable. Multifocal mild stenoses involving the mid and distal left M1 segment noted, mildly progressed from previous (series 1045, images 10, 12). Moderate diffuse narrowing at the proximal left M2 inferior division again seen, which appears more diffuse and mildly progressed from previous. Atheromatous irregularity at the left M2 superior division without high-grade stenosis. No aneurysm. POSTERIOR CIRCULATION: Vertebral arteries remain widely patent to the vertebrobasilar junction. Left vertebral artery dominant. Both picas remain patent proximally. Basilar remains widely patent to its distal aspect without stenosis. Superior cerebral arteries patent bilaterally. Both PCAs primarily supplied via the basilar. Focal signal loss involving the mid left P2 segment, consistent with a severe underlying stenosis (series 1055, image 6), also seen on prior. There is an additional severe distal right P3 stenosis, similar to previous (series 1055, image 19). IMPRESSION: MRI HEAD IMPRESSION: 1. Stable brain MRI, with no acute intracranial abnormality identified. 2. Age-related cerebral atrophy with associated chronic left MCA territory infarct in underlying chronic small vessel ischemic disease. MRA HEAD IMPRESSION: 1. Motion degraded exam. 2. Probable at least  moderate stenoses involving the cavernous left ICA as above, progressed from previous. 3. Severe stenosis at the right MCA bifurcation, stable. Moderate diffuse stenosis involving the proximal left M2 inferior division, mildly progressed. 4. Severe left P2 and distal right P3 stenoses, similar to previous. Electronically Signed   By: Jeannine Boga M.D.   On: 01/21/2020 05:16   MR BRAIN WO CONTRAST  Result Date: 01/21/2020 CLINICAL DATA:  Follow-up examination for intracranial atherosclerotic disease. History of left MCA angioplasty for stenosis. EXAM:  MRI HEAD WITHOUT CONTRAST MRA HEAD WITHOUT CONTRAST TECHNIQUE: Multiplanar, multiecho pulse sequences of the brain and surrounding structures were obtained without intravenous contrast. Angiographic images of the head were obtained using MRA technique without contrast. COMPARISON:  Previous MRI from 06/17/2019 FINDINGS: MRI HEAD FINDINGS Brain: Mild age-related cerebral atrophy, stable. Patchy T2/FLAIR hyperintensity within the periventricular and deep white matter both cerebral hemispheres most consistent with chronic small vessel ischemic disease, mild to moderate in nature, also unchanged. Scattered areas of encephalomalacia and gliosis involving the left frontal and parietal lobes compatible with chronic left MCA territory infarct, unchanged. Few tiny remote cerebellar infarcts noted as well. No abnormal foci of restricted diffusion to suggest acute or subacute ischemia. Gray-white matter differentiation maintained elsewhere. No other areas of chronic cortical infarction. Small chronic microhemorrhage noted at the right occipital lobe, stable. No other evidence for acute or chronic intracranial hemorrhage. No mass lesion, midline shift or mass effect. No hydrocephalus or extra-axial fluid collection. Empty sella noted, stable. Midline structures intact. Vascular: Major intracranial vascular flow voids are maintained. Skull and upper cervical spine:  Craniocervical junction within normal limits. Bone marrow signal intensity normal. No scalp soft tissue abnormality. Sinuses/Orbits: Globes and orbital soft tissues demonstrate no acute finding. Paranasal sinuses are clear. Trace bilateral mastoid effusions noted, of doubtful significance. Inner ear structures grossly normal. Other: None. MRA HEAD FINDINGS ANTERIOR CIRCULATION: Examination degraded by motion artifact. Visualized distal cervical segments of the internal carotid arteries are patent with symmetric antegrade flow. Distal cervical left ICA tortuous. A short-segment stenosis of up to approximately 35% present at the cervical-petrous junction on the left (series 18, image 50). Distally, there is focal signal loss at the proximal cavernous left ICA (series 18, image 74). There is an underlying mild stenosis at this level on previous exam, raising the possibility for a worsened moderate to severe stenosis on today's study. Additional possible short-segment mild-to-moderate stenosis just distally within the cavernous left ICA (series 18, image 90). Left ICA otherwise patent to the terminus. On the right, the petrous right ICA is widely patent. Mild narrowing at the supraclinoid right ICA again noted, stable. A1 segments remain patent. Grossly normal anterior communicating artery complex. Anterior cerebral arteries patent to their distal aspects without stenosis. M1 segments remain patent. Severe stenosis at the right MCA bifurcation again noted, stable. Multifocal mild stenoses involving the mid and distal left M1 segment noted, mildly progressed from previous (series 1045, images 10, 12). Moderate diffuse narrowing at the proximal left M2 inferior division again seen, which appears more diffuse and mildly progressed from previous. Atheromatous irregularity at the left M2 superior division without high-grade stenosis. No aneurysm. POSTERIOR CIRCULATION: Vertebral arteries remain widely patent to the  vertebrobasilar junction. Left vertebral artery dominant. Both picas remain patent proximally. Basilar remains widely patent to its distal aspect without stenosis. Superior cerebral arteries patent bilaterally. Both PCAs primarily supplied via the basilar. Focal signal loss involving the mid left P2 segment, consistent with a severe underlying stenosis (series 1055, image 6), also seen on prior. There is an additional severe distal right P3 stenosis, similar to previous (series 1055, image 19). IMPRESSION: MRI HEAD IMPRESSION: 1. Stable brain MRI, with no acute intracranial abnormality identified. 2. Age-related cerebral atrophy with associated chronic left MCA territory infarct in underlying chronic small vessel ischemic disease. MRA HEAD IMPRESSION: 1. Motion degraded exam. 2. Probable at least moderate stenoses involving the cavernous left ICA as above, progressed from previous. 3. Severe stenosis at the right MCA bifurcation, stable.  Moderate diffuse stenosis involving the proximal left M2 inferior division, mildly progressed. 4. Severe left P2 and distal right P3 stenoses, similar to previous. Electronically Signed   By: Jeannine Boga M.D.   On: 01/21/2020 05:16    Labs:  CBC: No results for input(s): WBC, HGB, HCT, PLT in the last 8760 hours.  COAGS: No results for input(s): INR, APTT in the last 8760 hours.  BMP: No results for input(s): NA, K, CL, CO2, GLUCOSE, BUN, CALCIUM, CREATININE, GFRNONAA, GFRAA in the last 8760 hours.  Invalid input(s): CMP  LIVER FUNCTION TESTS: No results for input(s): BILITOT, AST, ALT, ALKPHOS, PROT, ALBUMIN in the last 8760 hours.  TUMOR MARKERS: No results for input(s): AFPTM, CEA, CA199, CHROMGRNA in the last 8760 hours.  Assessment and Plan:  Left MCA M1 stenosis s/p revascularization using balloon angioplasty 09/06/2000 by Dr. Estanislado Pandy; s/p revascularization of left MCA M1 stenosis using balloon angioplasty 03/10/2001 by Dr. Estanislado Pandy; MR/MRA  brain/head 01/20/2020 reveling possible progressive stenosis. Dr. Estanislado Pandy was present for consultation.  Discussed symptoms. Patient is grossly asymptomatic at this time. Discussed most recent MR/MRA results, specifically bilateral MCA stenosis (left>right). Explained the best course of management for her recurrent stenosis at this time is with a diagnostic cerebral arteriogram to accurately evaluate MCA stenosis. Patient expresses desire to move forward with procedure. Plan for follow-up with an image-guided diagnostic cerebral arteriogram, first available. Informed patient that our schedulers will call her to set up this imaging scan.  Discussed secondary stroke prevention. Instructed patient to follow-up with PCP regarding hyperlipidemia and diabetes mellitus management.  All questions answered and concerns addressed. Patient conveys understanding and agrees with plan.  Thank you for this interesting consult.  I greatly enjoyed meeting BREZLYN TROUPE and look forward to participating in their care.  A copy of this report was sent to the requesting provider on this date.  Electronically Signed: Earley Abide, PA-C 02/15/2020, 8:25 AM   I spent a total of 25 Minutes in face to face in clinical consultation, greater than 50% of which was counseling/coordinating care for imaging follow-up.

## 2020-02-23 ENCOUNTER — Other Ambulatory Visit (HOSPITAL_COMMUNITY): Payer: Self-pay | Admitting: Interventional Radiology

## 2020-02-23 DIAGNOSIS — I771 Stricture of artery: Secondary | ICD-10-CM

## 2020-02-25 ENCOUNTER — Telehealth (HOSPITAL_COMMUNITY): Payer: Self-pay

## 2020-02-25 ENCOUNTER — Other Ambulatory Visit: Payer: Self-pay | Admitting: Radiology

## 2020-02-25 NOTE — Telephone Encounter (Signed)
Called to reschedule angiogram, no answer, left vm. AW  

## 2020-03-01 ENCOUNTER — Ambulatory Visit (HOSPITAL_COMMUNITY): Admission: RE | Admit: 2020-03-01 | Payer: Medicare Other | Source: Ambulatory Visit

## 2020-03-03 ENCOUNTER — Ambulatory Visit (HOSPITAL_COMMUNITY): Admission: RE | Admit: 2020-03-03 | Payer: Medicare Other | Source: Ambulatory Visit

## 2020-03-14 ENCOUNTER — Other Ambulatory Visit: Payer: Self-pay | Admitting: Radiology

## 2020-03-15 ENCOUNTER — Emergency Department (HOSPITAL_COMMUNITY)
Admission: EM | Admit: 2020-03-15 | Discharge: 2020-03-15 | Disposition: A | Discharge status: Home / Self Care | Payer: Medicare Other | Attending: Emergency Medicine | Admitting: Emergency Medicine

## 2020-03-15 ENCOUNTER — Other Ambulatory Visit: Payer: Self-pay

## 2020-03-15 ENCOUNTER — Encounter (HOSPITAL_COMMUNITY): Payer: Self-pay | Admitting: Emergency Medicine

## 2020-03-15 ENCOUNTER — Encounter (HOSPITAL_COMMUNITY): Payer: Self-pay

## 2020-03-15 ENCOUNTER — Ambulatory Visit (HOSPITAL_COMMUNITY)
Admission: RE | Admit: 2020-03-15 | Discharge: 2020-03-15 | Disposition: A | Discharge status: Home / Self Care | Payer: Medicare Other | Source: Ambulatory Visit | Attending: Interventional Radiology | Admitting: Interventional Radiology

## 2020-03-15 ENCOUNTER — Emergency Department (HOSPITAL_BASED_OUTPATIENT_CLINIC_OR_DEPARTMENT_OTHER): Payer: Medicare Other

## 2020-03-15 DIAGNOSIS — Z5321 Procedure and treatment not carried out due to patient leaving prior to being seen by health care provider: Secondary | ICD-10-CM | POA: Diagnosis not present

## 2020-03-15 DIAGNOSIS — R2242 Localized swelling, mass and lump, left lower limb: Secondary | ICD-10-CM | POA: Insufficient documentation

## 2020-03-15 DIAGNOSIS — I771 Stricture of artery: Secondary | ICD-10-CM | POA: Insufficient documentation

## 2020-03-15 DIAGNOSIS — Z87891 Personal history of nicotine dependence: Secondary | ICD-10-CM | POA: Insufficient documentation

## 2020-03-15 DIAGNOSIS — I10 Essential (primary) hypertension: Secondary | ICD-10-CM | POA: Insufficient documentation

## 2020-03-15 DIAGNOSIS — Z79899 Other long term (current) drug therapy: Secondary | ICD-10-CM | POA: Insufficient documentation

## 2020-03-15 DIAGNOSIS — M7989 Other specified soft tissue disorders: Secondary | ICD-10-CM

## 2020-03-15 DIAGNOSIS — M79662 Pain in left lower leg: Secondary | ICD-10-CM | POA: Insufficient documentation

## 2020-03-15 DIAGNOSIS — E119 Type 2 diabetes mellitus without complications: Secondary | ICD-10-CM | POA: Insufficient documentation

## 2020-03-15 DIAGNOSIS — Z794 Long term (current) use of insulin: Secondary | ICD-10-CM | POA: Insufficient documentation

## 2020-03-15 DIAGNOSIS — Z8673 Personal history of transient ischemic attack (TIA), and cerebral infarction without residual deficits: Secondary | ICD-10-CM | POA: Insufficient documentation

## 2020-03-15 DIAGNOSIS — Z7902 Long term (current) use of antithrombotics/antiplatelets: Secondary | ICD-10-CM | POA: Insufficient documentation

## 2020-03-15 DIAGNOSIS — Z539 Procedure and treatment not carried out, unspecified reason: Secondary | ICD-10-CM | POA: Insufficient documentation

## 2020-03-15 DIAGNOSIS — J45909 Unspecified asthma, uncomplicated: Secondary | ICD-10-CM | POA: Insufficient documentation

## 2020-03-15 DIAGNOSIS — M109 Gout, unspecified: Secondary | ICD-10-CM | POA: Insufficient documentation

## 2020-03-15 LAB — CBC
HCT: 43.3 % (ref 36.0–46.0)
HCT: 43.1 % (ref 36.0–46.0)
Hemoglobin: 13.6 g/dL (ref 12.0–15.0)
Hemoglobin: 13.6 g/dL (ref 12.0–15.0)
MCH: 27.8 pg (ref 26.0–34.0)
MCH: 28 pg (ref 26.0–34.0)
MCHC: 31.4 g/dL (ref 30.0–36.0)
MCHC: 31.6 g/dL (ref 30.0–36.0)
MCV: 88.4 fL (ref 80.0–100.0)
MCV: 88.7 fL (ref 80.0–100.0)
Platelets: 275 10*3/uL (ref 150–400)
Platelets: 253 10*3/uL (ref 150–400)
RBC: 4.9 MIL/uL (ref 3.87–5.11)
RBC: 4.86 MIL/uL (ref 3.87–5.11)
RDW: 14 % (ref 11.5–15.5)
RDW: 14 % (ref 11.5–15.5)
WBC: 13.2 10*3/uL — ABNORMAL HIGH (ref 4.0–10.5)
WBC: 12.8 10*3/uL — ABNORMAL HIGH (ref 4.0–10.5)
nRBC: 0 % (ref 0.0–0.2)
nRBC: 0 % (ref 0.0–0.2)

## 2020-03-15 LAB — BASIC METABOLIC PANEL
Anion gap: 9 (ref 5–15)
Anion gap: 11 (ref 5–15)
BUN: 17 mg/dL (ref 8–23)
BUN: 15 mg/dL (ref 8–23)
CO2: 25 mmol/L (ref 22–32)
CO2: 23 mmol/L (ref 22–32)
Calcium: 8.9 mg/dL (ref 8.9–10.3)
Calcium: 8.6 mg/dL — ABNORMAL LOW (ref 8.9–10.3)
Chloride: 109 mmol/L (ref 98–111)
Chloride: 109 mmol/L (ref 98–111)
Creatinine, Ser: 0.84 mg/dL (ref 0.44–1.00)
Creatinine, Ser: 0.8 mg/dL (ref 0.44–1.00)
GFR calc Af Amer: >60 mL/min (ref >60)
GFR calc Af Amer: >60 mL/min (ref >60)
GFR calc non Af Amer: >60 mL/min (ref >60)
GFR calc non Af Amer: >60 mL/min (ref >60)
Glucose, Bld: 180 mg/dL — ABNORMAL HIGH (ref 70–99)
Glucose, Bld: 181 mg/dL — ABNORMAL HIGH (ref 70–99)
Potassium: 4.7 mmol/L (ref 3.5–5.1)
Potassium: 4 mmol/L (ref 3.5–5.1)
Sodium: 143 mmol/L (ref 135–145)
Sodium: 143 mmol/L (ref 135–145)

## 2020-03-15 LAB — GLUCOSE, CAPILLARY: Glucose-Capillary: 183 mg/dL — ABNORMAL HIGH (ref 70–99)

## 2020-03-15 LAB — PROTIME-INR
INR: 1.4 — ABNORMAL HIGH (ref 0.8–1.2)
Prothrombin Time: 17 seconds — ABNORMAL HIGH (ref 11.4–15.2)

## 2020-03-15 MED ORDER — SODIUM CHLORIDE 0.9 % IV SOLN: Freq: Once | INTRAVENOUS | Status: AC

## 2020-03-15 NOTE — Sedation Documentation (Signed)
Case cancelled per Dr Mariane Baumgarten.  Pt brought to ED for further evaluation of LLE swelling.

## 2020-03-15 NOTE — H&P (Signed)
Chief Complaint: Patient was seen in consultation today for cerebral arteriogram at the request of Dr Roxy Cedar   Supervising Physician: Luanne Bras  Patient Status: [facility] - Out-pt  History of Present Illness: Teresa Kerr is a 69 y.o. female   Known to NIR Left MCA M1 stenosis s/p revascularization using balloon angioplasty 09/06/2000 by Dr. Estanislado Pandy; s/p revascularization of left MCA M1 stenosis using balloon angioplasty 03/10/2001 by Dr. Estanislado Pandy; MR/MRA brain/head 01/20/2020 reveling possible progressive stenosis.  Consultation after 01/20/20 MRI/MRA 02/15/20 Discussed symptoms. Patient is grossly asymptomatic at this time. Discussed most recent MR/MRA results, specifically bilateral MCA stenosis (left>right). Explained the best course of management for her recurrent stenosis at this time is with a diagnostic cerebral arteriogram to accurately evaluate MCA stenosis. Patient expresses desire to move forward with procedure. Plan for follow-up with an image-guided diagnostic cerebral arteriogram, first available. Informed patient that our schedulers will call her to set up this imaging scan.  Pt is asymptomatic at this time Sometimes she notices some slurred speech - but rare  Scheduled today for cerebral arteriogram    Past Medical History:  Diagnosis Date  . Asthma   . Diabetes mellitus   . Gout   . Hypertension   . Neuromuscular disorder ([place])    right sides weakness arm and leg, steroid injections to right shoulder-Dr. Beane(last 8'16)  . Stroke [facility]) 2002, 1999   pt has right sided weakness at times, gets ESI when weakness returns  . Umbilical hernia   . Uterine fibroid     Past Surgical History:  Procedure Laterality Date  . ANGIOPLASTY     x 2 times-cerebral arterial stents- [place]  . CYSTOSCOPY WITH RETROGRADE PYELOGRAM, URETEROSCOPY AND STENT PLACEMENT Left 09/01/2015   Procedure: CYSTOSCOPY WITH LEFT RETROGRADE, LEFT URETEROSCOPY AND STENT  PLACEMENT;  Surgeon: Irine Seal, MD;  Location: [facility] ORS;  Service: Urology;  Laterality: Left;  . HAND SURGERY    . HOLMIUM LASER APPLICATION Left 38/09/8297   Procedure: WITH HOLMIUM LASER ;  Surgeon: Irine Seal, MD;  Location: [facility];  Service: Urology;  Laterality: Left;  . IR ANGIO INTRA EXTRACRAN SEL COM CAROTID INNOMINATE BILAT MOD SED  03/04/2018  . IR ANGIO VERTEBRAL SEL SUBCLAVIAN INNOMINATE UNI R MOD SED  03/04/2018  . IR ANGIO VERTEBRAL SEL VERTEBRAL UNI L MOD SED  03/04/2018  . TOE SURGERY      Allergies: Hydrocodone-acetaminophen and Latex  Medications: Prior to Admission medications   Medication Sig Start Date End Date Taking? Authorizing Provider  acetaminophen (TYLENOL) 325 MG tablet Take 325-650 mg by mouth every 6 (six) hours as needed for mild pain, moderate pain, fever or headache.   Yes [provider]  allopurinol (ZYLOPRIM) 100 MG tablet Take 100 mg by mouth daily. 01/04/17  Yes [provider]  amLODipine (NORVASC) 10 MG tablet Take 10 mg by mouth daily.     Yes [provider]  atorvastatin (LIPITOR) 10 MG tablet Take 10 mg by mouth daily at 6 PM.  01/25/15  Yes [provider]  colchicine 0.6 MG tablet Take 0.6-1.2 mg by mouth See admin instructions. 1.2 mg with flares; take 0.6 mg if still present after one hour   Yes [provider]  Fluticasone-Salmeterol (ADVAIR) 250-50 MCG/DOSE AEPB Inhale 2 puffs into the lungs daily as needed (for flares).    Yes [provider]  furosemide (LASIX) 20 MG tablet Take 20 mg by mouth daily.    Yes [provider]  gabapentin (NEURONTIN) 300 MG capsule Take 300 mg by mouth 2 (two) times daily.   Yes [provider]  JANUVIA 100 MG tablet Take 100 mg by mouth daily. 05/19/19  Yes [provider]  LEVEMIR FLEXTOUCH 100 UNIT/ML Pen Inject 50 Units into the skin at bedtime. Patient taking differently: Inject into the skin at bedtime. Per pt: 54u at night, and 8u  q AM 01/18/17  Yes Jani Gravel, MD  lisinopril (PRINIVIL,ZESTRIL) 40 MG tablet Take 40 mg by mouth daily.  01/05/15  Yes [provider]  losartan (COZAAR) 50 MG tablet Take 50 mg by mouth daily. 06/02/19  Yes [provider]  PROAIR RESPICLICK 329 (90 BASE) MCG/ACT AEPB Inhale 2 puffs into the lungs every 6 (six) hours as needed (shortness of breath).  10/25/14  Yes [provider]  azithromycin (ZITHROMAX) 250 MG tablet azithromycin 250 mg tablet    [provider]  cloNIDine (CATAPRES) 0.1 MG tablet Take 0.1 mg by mouth 2 (two) times daily. 08/22/15   [provider]  clopidogrel (PLAVIX) 75 MG tablet Take 75 mg by mouth daily.  05/01/12   [provider]  cyclobenzaprine (FLEXERIL) 5 MG tablet cyclobenzaprine 5 mg tablet  TAKE 1 TABLET BY MOUTH 2 TIMES DAILY    [provider]  DEXILANT 30 MG capsule Take 30 mg by mouth daily. 11/26/16   [provider]  diazepam (VALIUM) 5 MG tablet diazepam 5 mg tablet  TAKE 1 OR 2 TABLETS BY MOUTH 1 HOUR PRIOR TO PROCEDURE    [provider]  ipratropium-albuterol (DUONEB) 0.5-2.5 (3) MG/3ML SOLN Take 3 mLs by nebulization every 4 (four) hours as needed. 01/18/17   Jani Gravel, MD  LORazepam (ATIVAN) 1 MG tablet 1 TABLET TO BE TAKEN AT OFFICE VISIT ON 9/23. MUST HAVE SOMEONE TO DRIVE FOLLOWING APPOINTMENT 06/11/19   [provider]  mupirocin nasal ointment (BACTROBAN) 2 % Apply in each nostril daily 01/31/18   Wardell Honour, MD  ondansetron (ZOFRAN) 4 MG tablet Take 1 tablet (4 mg total) by mouth every 8 (eight) hours as needed for nausea or vomiting. 08/09/15   Hoyle Sauer, MD  ranitidine (ZANTAC) 150 MG tablet ranitidine 150 mg tablet    [provider]  Rivaroxaban 15 & 20 MG TBPK Take as directed on package: Start with one 15mg  tablet by mouth twice a day with food. On Day 22, switch to one 20mg  tablet once a day with food. 12/05/18   Nuala Alpha A, PA-C    terconazole (TERAZOL 7) 0.4 % vaginal cream terconazole 0.4 % vaginal cream  Insert 1 application vaginally at bedtime for 3 nights    [provider]  tiZANidine (ZANAFLEX) 2 MG tablet Take 2 mg by mouth 2 (two) times daily. 01/04/17   [provider]  traMADol (ULTRAM) 50 MG tablet Take 1 tablet (50 mg total) by mouth every 6 (six) hours as needed. 09/21/17   Duanne Guess, PA-C     Family History  Problem Relation Age of Onset  . Cancer Mother        breast/ had mastecomy  . Cancer Father        colon    Social History   Socioeconomic History  . Marital status: Widowed    Spouse name: Not on file  . Number of children: 3  . Years of education: Not on file  . Highest education level: Not on file  Occupational History  . Not on  file  Tobacco Use  . Smoking status: Former Smoker    Packs/day: 1.50    Years: 34.00    Pack years: 51.00    Quit date: 09/24/1997    Years since quitting: 22.4  . Smokeless tobacco: Never Used  Vaping Use  . Vaping Use: Never used  Substance and Sexual Activity  . Alcohol use: No  . Drug use: No  . Sexual activity: Not on file  Other Topics Concern  . Not on file  Social History Narrative  . Not on file   Social Determinants of Health   Financial Resource Strain:   . Difficulty of Paying Living Expenses:   Food Insecurity:   . Worried About Charity fundraiser in the Last Year:   . Arboriculturist in the Last Year:   Transportation Needs:   . Film/video editor (Medical):   Marland Kitchen Lack of Transportation (Non-Medical):   Physical Activity:   . Days of Exercise per Week:   . Minutes of Exercise per Session:   Stress:   . Feeling of Stress :   Social Connections:   . Frequency of Communication with Friends and Family:   . Frequency of Social Gatherings with Friends and Family:   . Attends Religious Services:   . Active Member of Clubs or Organizations:   . Attends Archivist Meetings:   Marland Kitchen Marital  Status:     Review of Systems: A 12 point ROS discussed and pertinent positives are indicated in the HPI above.  All other systems are negative.  Review of Systems  Constitutional: Negative for activity change and fever.  HENT: Negative for tinnitus and trouble swallowing.   Eyes: Negative for visual disturbance.  Respiratory: Negative for cough and shortness of breath.   Cardiovascular: Negative for chest pain.  Gastrointestinal: Negative for abdominal pain.  Musculoskeletal: Negative for gait problem.  Neurological: Negative for dizziness, tremors, seizures, syncope, facial asymmetry, speech difficulty, weakness, light-headedness, numbness and headaches.  Psychiatric/Behavioral: Negative for behavioral problems and confusion.    Vital Signs: BP (!) 153/89   Pulse 90   Temp 97.8 F (36.6 C) (Oral)   Resp 16   Ht 5\' 4"  (1.626 m)   Wt 235 lb (106.6 kg)   SpO2 97%   BMI 40.34 kg/m   Physical Exam Vitals reviewed.  Cardiovascular:     Rate and Rhythm: Normal rate and regular rhythm.     Heart sounds: Normal heart sounds.  Pulmonary:     Effort: Pulmonary effort is normal.     Breath sounds: Normal breath sounds.  Abdominal:     Palpations: Abdomen is soft.  Musculoskeletal:        General: Normal range of motion.  Skin:    General: Skin is warm.  Neurological:     Mental Status: She is alert and oriented to person, place, and time.  Psychiatric:        Mood and Affect: Mood normal.        Behavior: Behavior normal.        Thought Content: Thought content normal.        Judgment: Judgment normal.     Imaging: No results found.  Labs:  CBC: Recent Labs    03/15/20 0853  WBC 13.2*  HGB 13.6  HCT 43.3  PLT 275    COAGS: Recent Labs    03/15/20 0853  INR 1.4*    BMP: No results for input(s): NA, K, CL, CO2,  GLUCOSE, BUN, CALCIUM, CREATININE, GFRNONAA, GFRAA in the last 8760 hours.  Invalid input(s): CMP  LIVER FUNCTION TESTS: No results for  input(s): BILITOT, AST, ALT, ALKPHOS, PROT, ALBUMIN in the last 8760 hours.  TUMOR MARKERS: No results for input(s): AFPTM, CEA, CA199, CHROMGRNA in the last 8760 hours.  Assessment and Plan:  Known to NIR Previous CVA L MCA revascularization 08/2000 and 02/2001 Now with MRI/MRA evidence of probable progressive stenosis Scheduled for cerebral arteriogram for further delineation Risks and benefits of cerebral angiogram with intervention were discussed with the patient including, but not limited to bleeding, infection, vascular injury, contrast induced renal failure, stroke or even death.  This interventional procedure involves the use of X-rays and because of the nature of the planned procedure, it is possible that we will have prolonged use of X-ray fluoroscopy.  Potential radiation risks to you include (but are not limited to) the following: - A slightly elevated risk for cancer  several years later in life. This risk is typically less than 0.5% percent. This risk is low in comparison to the normal incidence of human cancer, which is 33% for women and 50% for men according to the [employer]. - Radiation induced injury can include skin redness, resembling a rash, tissue breakdown / ulcers and hair loss (which can be temporary or permanent).   The likelihood of either of these occurring depends on the difficulty of the procedure and whether you are sensitive to radiation due to previous procedures, disease, or genetic conditions.   IF your procedure requires a prolonged use of radiation, you will be notified and given written instructions for further action.  It is your responsibility to monitor the irradiated area for the 2 weeks following the procedure and to notify your physician if you are concerned that you have suffered a radiation induced injury.    All of the patient's questions were answered, patient is agreeable to proceed. Consent signed and in chart.  Thank you  for this interesting consult.  I greatly enjoyed meeting MAURIANNA BENARD and look forward to participating in their care.  A copy of this report was sent to the requesting provider on this date.  Electronically Signed: Lavonia Drafts, PA-C 03/15/2020, 9:25 AM   I spent a total of    25 Minutes in face to face in clinical consultation, greater than 50% of which was counseling/coordinating care for cerebral arteriogram

## 2020-03-15 NOTE — Sedation Documentation (Addendum)
Notified Dr. Estanislado Pandy that pts left leg is significantly more swollen than the right leg.  LLE edema 3+ pitting.  Pt c/o pain in left calf, +homans sign.  Notified Dr. Estanislado Pandy that WBC is 13.2

## 2020-03-15 NOTE — Discharge Instructions (Addendum)
Radial Site Care  This sheet gives you information about how to care for yourself after your procedure. Your health care provider may also give you more specific instructions. If you have problems or questions, contact your health care provider. What can I expect after the procedure? After the procedure, it is common to have:  Bruising and tenderness at the catheter insertion area. Follow these instructions at home: Medicines  Take over-the-counter and prescription medicines only as told by your health care provider. Insertion site care  Follow instructions from your health care provider about how to take care of your insertion site. Make sure you: ? Wash your hands with soap and water before you change your bandage (dressing). If soap and water are not available, use hand sanitizer. ? Change your dressing as told by your health care provider. ? Leave stitches (sutures), skin glue, or adhesive strips in place. These skin closures may need to stay in place for 2 weeks or longer. If adhesive strip edges start to loosen and curl up, you may trim the loose edges. Do not remove adhesive strips completely unless your health care provider tells you to do that.  Check your insertion site every day for signs of infection. Check for: ? Redness, swelling, or pain. ? Fluid or blood. ? Pus or a bad smell. ? Warmth.  Do not take baths, swim, or use a hot tub until your health care provider approves.  You may shower 24-48 hours after the procedure, or as directed by your health care provider. ? Remove the dressing and gently wash the site with plain soap and water. ? Pat the area dry with a clean towel. ? Do not rub the site. That could cause bleeding.  Do not apply powder or lotion to the site. Activity   For 24 hours after the procedure, or as directed by your health care provider: ? Do not flex or bend the affected arm. ? Do not push or pull heavy objects with the affected arm. ? Do not  drive yourself home from the hospital or clinic. You may drive 24 hours after the procedure unless your health care provider tells you not to. ? Do not operate machinery or power tools.  Do not lift anything that is heavier than 10 lb (4.5 kg), or the limit that you are told, until your health care provider says that it is safe.  Ask your health care provider when it is okay to: ? Return to work or school. ? Resume usual physical activities or sports. ? Resume sexual activity. General instructions  If the catheter site starts to bleed, raise your arm and put firm pressure on the site. If the bleeding does not stop, get help right away. This is a medical emergency.  If you went home on the same day as your procedure, a responsible adult should be with you for the first 24 hours after you arrive home.  Keep all follow-up visits as told by your health care provider. This is important. Contact a health care provider if:  You have a fever.  You have redness, swelling, or yellow drainage around your insertion site. Get help right away if:  You have unusual pain at the radial site.  The catheter insertion area swells very fast.  The insertion area is bleeding, and the bleeding does not stop when you hold steady pressure on the area.  Your arm or hand becomes pale, cool, tingly, or numb. These symptoms may represent a serious problem   that is an emergency. Do not wait to see if the symptoms will go away. Get medical help right away. Call your local emergency services (911 in the [place] Do not drive yourself to the hospital. Summary  After the procedure, it is common to have bruising and tenderness at the site.  Follow instructions from your health care provider about how to take care of your radial site wound. Check the wound every day for signs of infection.  Do not lift anything that is heavier than 10 lb (4.5 kg), or the limit that you are told, until your health care provider says  that it is safe. This information is not intended to replace advice given to you by your health care provider. Make sure you discuss any questions you have with your health care provider. Document Revised: 10/16/2017 Document Reviewed: 10/16/2017 Elsevier Patient Education  2020 [employer] Cerebral Angiogram, Care After This sheet gives you information about how to care for yourself after your procedure. Your health care provider may also give you more specific instructions. If you have problems or questions, contact your health care provider. What can I expect after the procedure? After the procedure, it is common to have:  Bruising and tenderness at the catheter insertion site.  A mild headache. Follow these instructions at home: Insertion site care  Follow instructions from your health care provider about how to take care of the insertion site. Make sure you: ? Wash your hands with soap and water before and after you change your bandage (dressing). If soap and water are not available, use hand sanitizer. ? Change your dressing as told by your health care provider.  Do not take baths, swim, or use a hot tub until your health care provider approves. You may shower 24-48 hours after the procedure, or as told by your health care provider.  To clean your insertion site: ? Gently wash the site with plain soap and water. ? Pat the area dry with a clean towel. ? Do not rub the site. This may cause bleeding.  Do not apply powder or lotion to the site. Keep the site clean and dry. Infection signs Check your incision area every day for signs of infection. Check for:  Redness, swelling, or pain.  Fluid or blood.  Warmth.  Pus or a bad smell.  Activity  Do not drive for 24 hours if you were given a sedative during your procedure.  Rest as told by your health care provider.  Do not lift anything that is heavier than 10 lb (4.5 kg), or the limit that you are told, until your  health care provider says that it is safe.  Return to your normal activities as told by your health care provider, usually in about a week. Ask your health care provider what activities are safe for you. General instructions   If your insertion site starts to bleed, lie flat and put pressure on the site. If the bleeding does not stop, get help right away. This is a medical emergency.  Do not use any products that contain nicotine or tobacco, such as cigarettes, e-cigarettes, and chewing tobacco. If you need help quitting, ask your health care provider.  Take over-the-counter and prescription medicines only as told by your health care provider.  Drink enough fluid to keep your urine pale yellow. This helps flush the contrast dye from your body.  Keep all follow-up visits as directed by your health care provider. This is important. Contact a health   care provider if:  You have a fever or chills.  You have redness, swelling, or pain around your insertion site.  You have fluid or blood coming from your insertion site.  The insertion site feels warm to the touch.  You have pus or a bad smell coming from your insertion site.  You notice blood collecting in the tissue around the insertion site (hematoma). The hematoma may be painful to the touch. Get help right away if:  You have chest pain or trouble breathing.  You have severe pain or swelling at the insertion site.  The insertion area bleeds, and bleeding continues after 30 minutes of holding steady pressure on the site.  The arm or leg where the catheter was inserted is pale, cold, numb, tingling, or weak.  You have a rash.  You have any symptoms of a stroke. "BE FAST" is an easy way to remember the main warning signs of a stroke: ? B - Balance. Signs are dizziness, sudden trouble walking, or loss of balance. ? E - Eyes. Signs are trouble seeing or a sudden change in vision. ? F - Face. Signs are sudden weakness or numbness of  the face, or the face or eyelid drooping on one side. ? A - Arms. Signs are weakness or numbness in an arm. This happens suddenly and usually on one side of the body. ? S - Speech. Signs are sudden trouble speaking, slurred speech, or trouble understanding what people say. ? T - Time. Time to call emergency services. Write down what time symptoms started.  You have other signs of a stroke, such as: ? A sudden, severe headache with no known cause. ? Nausea or vomiting. ? Seizure. These symptoms may represent a serious problem that is an emergency. Do not wait to see if the symptoms will go away. Get medical help right away. Call your local emergency services (911 in the [place] Do not drive yourself to the hospital. Summary  Bruising and tenderness at the insertion site are common.  Follow your health care provider's instructions about caring for your insertion site. Change dressing and clean the area as instructed.  If your insertion site bleeds, apply direct pressure until bleeding stops.  Return to your normal activities as told by your health care provider. Ask what activities are safe.  Rest and drink plenty of fluids. This information is not intended to replace advice given to you by your health care provider. Make sure you discuss any questions you have with your health care provider. Document Revised: 03/31/2019 Document Reviewed: 03/31/2019 Elsevier Patient Education  2020 [employer] Moderate Conscious Sedation, Adult Sedation is the use of medicines to promote relaxation and relieve discomfort and anxiety. Moderate conscious sedation is a type of sedation. Under moderate conscious sedation, you are less alert than normal, but you are still able to respond to instructions, touch, or both. Moderate conscious sedation is used during short medical and dental procedures. It is milder than deep sedation, which is a type of sedation under which you cannot be easily woken up. It is also  milder than general anesthesia, which is the use of medicines to make you unconscious. Moderate conscious sedation allows you to return to your regular activities sooner. Tell a health care provider about:  Any allergies you have.  All medicines you are taking, including vitamins, herbs, eye drops, creams, and over-the-counter medicines.  Use of steroids (by mouth or creams).  Any problems you or family members have had with   sedatives and anesthetic medicines.  Any blood disorders you have.  Any surgeries you have had.  Any medical conditions you have, such as sleep apnea.  Whether you are pregnant or may be pregnant.  Any use of cigarettes, alcohol, marijuana, or street drugs. What are the risks? Generally, this is a safe procedure. However, problems may occur, including:  Getting too much medicine (oversedation).  Nausea.  Allergic reaction to medicines.  Trouble breathing. If this happens, a breathing tube may be used to help with breathing. It will be removed when you are awake and breathing on your own.  Heart trouble.  Lung trouble. What happens before the procedure? Staying hydrated Follow instructions from your health care provider about hydration, which may include:  Up to 2 hours before the procedure - you may continue to drink clear liquids, such as water, clear fruit juice, black coffee, and plain tea. Eating and drinking restrictions Follow instructions from your health care provider about eating and drinking, which may include:  8 hours before the procedure - stop eating heavy meals or foods such as meat, fried foods, or fatty foods.  6 hours before the procedure - stop eating light meals or foods, such as toast or cereal.  6 hours before the procedure - stop drinking milk or drinks that contain milk.  2 hours before the procedure - stop drinking clear liquids. Medicine Ask your health care provider about:  Changing or stopping your regular medicines.  This is especially important if you are taking diabetes medicines or blood thinners.  Taking medicines such as aspirin and ibuprofen. These medicines can thin your blood. Do not take these medicines before your procedure if your health care provider instructs you not to.  Tests and exams  You will have a physical exam.  You may have blood tests done to show: ? How well your kidneys and liver are working. ? How well your blood can clot. General instructions  Plan to have someone take you home from the hospital or clinic.  If you will be going home right after the procedure, plan to have someone with you for 24 hours. What happens during the procedure?  An IV tube will be inserted into one of your veins.  Medicine to help you relax (sedative) will be given through the IV tube.  The medical or dental procedure will be performed. What happens after the procedure?  Your blood pressure, heart rate, breathing rate, and blood oxygen level will be monitored often until the medicines you were given have worn off.  Do not drive for 24 hours. This information is not intended to replace advice given to you by your health care provider. Make sure you discuss any questions you have with your health care provider. Document Revised: 08/23/2017 Document Reviewed: 12/31/2015 Elsevier Patient Education  2020 Else[employer] Inc.  

## 2020-03-15 NOTE — Progress Notes (Signed)
Patient ID: Teresa Kerr, female   DOB: Jul 22, 1951, 69 y.o.   MRN: [id number] INR. 71 Y RT H F scheduled for diagnostic arteriogram to evaluate for progression of RT MCA steosis and Lt ACA intracranial stenosis. During clinical evaluation noted to have sig swelling of the Lt calf associated with pain.. Patient noticed left calf swelling 3 days ago. Past history of RT leg DVT  Treated with Xarelto.  Reports deligent compliance with meds. O/E  Lt lower extremity below the knee shows a reddish hue with obvious swelling  extending fron the calf to the Lt ankle. Tender+++ in the left calf  Distal pulses palpable RT > LT. Denies recent chest pains or shortness of breath.  Impression. Probable Lt LE DVT  Will defer arteriogram for now . Needs evaluation of LT LE swelling. S.Kenyatta Gloeckner MD

## 2020-03-15 NOTE — Progress Notes (Signed)
Left lower extremity venous duplex complete.  Please see CV Proc tab for preliminary results. Allen, RVT 1:58 PM  03/15/2020

## 2020-03-15 NOTE — ED Triage Notes (Signed)
Patient arrives to ED from IR and was noted to have left calf swelling/pain x3 days. Patient left leg has a reddish color and is tender. Pt sent here to rule out DVT. Pt was supposed to have CTA today to look for MCA and ACA stenosis.

## 2020-03-15 NOTE — Sedation Documentation (Signed)
Dr Estanislado Pandy at bedside to evaluate pt

## 2020-03-29 ENCOUNTER — Telehealth (HOSPITAL_COMMUNITY): Payer: Self-pay

## 2020-03-29 NOTE — Telephone Encounter (Signed)
Called to reschedule angiogram, no answer, left vm. AW  

## 2020-04-07 ENCOUNTER — Ambulatory Visit (HOSPITAL_COMMUNITY): Admission: RE | Admit: 2020-04-07 | Payer: Medicare Other | Source: Ambulatory Visit

## 2020-06-06 ENCOUNTER — Telehealth (HOSPITAL_COMMUNITY): Payer: Self-pay

## 2020-06-06 NOTE — Telephone Encounter (Signed)
Called to reschedule angiogram. Pt is currently having shoulder pain and is trying to get that under control. She will call back when she is ready to reschedule. AW

## 2020-06-24 ENCOUNTER — Ambulatory Visit (INDEPENDENT_AMBULATORY_CARE_PROVIDER_SITE_OTHER): Payer: Medicare Other | Admitting: Podiatry

## 2020-06-24 ENCOUNTER — Other Ambulatory Visit: Payer: Self-pay

## 2020-06-24 DIAGNOSIS — B351 Tinea unguium: Secondary | ICD-10-CM

## 2020-06-24 DIAGNOSIS — M79674 Pain in right toe(s): Secondary | ICD-10-CM | POA: Diagnosis not present

## 2020-06-24 DIAGNOSIS — M79675 Pain in left toe(s): Secondary | ICD-10-CM

## 2020-06-24 DIAGNOSIS — E119 Type 2 diabetes mellitus without complications: Secondary | ICD-10-CM

## 2020-06-24 DIAGNOSIS — L600 Ingrowing nail: Secondary | ICD-10-CM

## 2020-06-24 DIAGNOSIS — Z794 Long term (current) use of insulin: Secondary | ICD-10-CM

## 2020-06-28 ENCOUNTER — Encounter: Payer: Self-pay | Admitting: Podiatry

## 2020-06-28 NOTE — Progress Notes (Signed)
Subjective: Teresa Kerr is seen today for follow up painful, elongated, thickened toenails 1-5 b/l feet that she cannot cut. Pain interferes with daily activities. Aggravating factor includes wearing enclosed shoe gear and relieved with periodic debridement.   Patient states blood sugar was 181 mg/dl this morning.   PCP is Dr. Nolene Ebbs. Last visit was last week per patient recall.  Pt c/o painful left hallux on today. She denies any redness, drainage or swelling.  Current Outpatient Medications on File Prior to Visit  Medication Sig   acetaminophen (TYLENOL) 325 MG tablet Take 325-650 mg by mouth every 6 (six) hours as needed for mild pain, moderate pain, fever or headache.   allopurinol (ZYLOPRIM) 100 MG tablet Take 100 mg by mouth daily.   amLODipine (NORVASC) 10 MG tablet Take 10 mg by mouth daily.     atorvastatin (LIPITOR) 10 MG tablet Take 10 mg by mouth daily at 6 PM.    azithromycin (ZITHROMAX) 250 MG tablet azithromycin 250 mg tablet   cloNIDine (CATAPRES) 0.1 MG tablet Take 0.1 mg by mouth 2 (two) times daily.   clopidogrel (PLAVIX) 75 MG tablet Take 75 mg by mouth daily.    colchicine 0.6 MG tablet Take 0.6-1.2 mg by mouth See admin instructions. 1.2 mg with flares; take 0.6 mg if still present after one hour   cyclobenzaprine (FLEXERIL) 5 MG tablet cyclobenzaprine 5 mg tablet  TAKE 1 TABLET BY MOUTH 2 TIMES DAILY   DEXILANT 30 MG capsule Take 30 mg by mouth daily.   diazepam (VALIUM) 5 MG tablet diazepam 5 mg tablet  TAKE 1 OR 2 TABLETS BY MOUTH 1 HOUR PRIOR TO PROCEDURE   Fluticasone-Salmeterol (ADVAIR) 250-50 MCG/DOSE AEPB Inhale 2 puffs into the lungs daily as needed (for flares).    furosemide (LASIX) 20 MG tablet Take 20 mg by mouth daily.    gabapentin (NEURONTIN) 300 MG capsule Take 300 mg by mouth 2 (two) times daily.   ipratropium-albuterol (DUONEB) 0.5-2.5 (3) MG/3ML SOLN Take 3 mLs by nebulization every 4 (four) hours as needed.    JANUVIA 100 MG tablet Take 100 mg by mouth daily.   LEVEMIR FLEXTOUCH 100 UNIT/ML Pen Inject 50 Units into the skin at bedtime. (Patient taking differently: Inject into the skin at bedtime. Per pt: 54u at night, and 8u q AM)   lisinopril (PRINIVIL,ZESTRIL) 40 MG tablet Take 40 mg by mouth daily.    LORazepam (ATIVAN) 1 MG tablet 1 TABLET TO BE TAKEN AT OFFICE VISIT ON 9/23. MUST HAVE SOMEONE TO DRIVE FOLLOWING APPOINTMENT   losartan (COZAAR) 50 MG tablet Take 50 mg by mouth daily.   mupirocin nasal ointment (BACTROBAN) 2 % Apply in each nostril daily   ondansetron (ZOFRAN) 4 MG tablet Take 1 tablet (4 mg total) by mouth every 8 (eight) hours as needed for nausea or vomiting.   PROAIR RESPICLICK 102 (90 BASE) MCG/ACT AEPB Inhale 2 puffs into the lungs every 6 (six) hours as needed (shortness of breath).    ranitidine (ZANTAC) 150 MG tablet ranitidine 150 mg tablet   Rivaroxaban 15 & 20 MG TBPK Take as directed on package: Start with one 15mg  tablet by mouth twice a day with food. On Day 22, switch to one 20mg  tablet once a day with food.   terconazole (TERAZOL 7) 0.4 % vaginal cream terconazole 0.4 % vaginal cream  Insert 1 application vaginally at bedtime for 3 nights   tiZANidine (ZANAFLEX) 2 MG tablet Take 2 mg by mouth 2 (  two) times daily.   traMADol (ULTRAM) 50 MG tablet Take 1 tablet (50 mg total) by mouth every 6 (six) hours as needed.   No current facility-administered medications on file prior to visit.     Allergies  Allergen Reactions   Hydrocodone-Acetaminophen Itching   Latex Itching, Rash and Other (See Comments)    RE: "Powdered gloves"     Objective:  Vascular Examination: Capillary refill time immediate x 10 digits. Dorsalis pedis present b/l. Posterior tibial pulses present b/l. Digital hair  present x 10 digits. Skin temperature gradient WNL b/l. No pain with calf compression b/l.  Dermatological Examination: Skin with normal turgor, texture and tone  b/l. No open wounds. No interdigital macerations noted b/l. Toenails 1-5 b/l discolored, thick, dystrophic with subungual debris and pain with palpation to nailbeds due to thickness of nails. Incurvated nailplate left great toe medial border with tenderness to palpation. No erythema, no edema, no drainage noted.  Musculoskeletal: Muscle strength 5/5 to all LE muscle groups No gross bony deformities b/l. No pain, crepitus or joint limitation noted with ROM.   Neurological Examination: Protective sensation intact with 10 gram monofilament bilaterally. Epicritic sensation present bilaterally. Vibratory sensation intact bilaterally.   Assessment: Painful onychomycosis toenails 1-5 b/l  Ingrown toenail left hallux NIDDM  Plan: 1. Toenails 1-5 b/l were debrided in length and girth without iatrogenic bleeding. Offending nail border debrided and curretaged left hallux. Border cleansed with alcohol and triple antibiotic applied. No further treatment required by patient. 2. Continue diabetic foot care principles. 3. Patient to continue soft, supportive shoe gear daily.  4. Patient to report any pedal injuries to medical professional immediately. 5. Follow up 3 months.  6. Patient/POA to call should there be a concern in the interim.

## 2020-09-28 ENCOUNTER — Ambulatory Visit: Payer: Medicare Other | Admitting: Podiatry

## 2020-10-07 ENCOUNTER — Ambulatory Visit: Payer: Medicare Other | Admitting: Podiatry

## 2020-11-15 ENCOUNTER — Ambulatory Visit: Payer: Medicare Other | Admitting: Gastroenterology

## 2020-11-15 ENCOUNTER — Encounter: Payer: Self-pay | Admitting: Gastroenterology

## 2020-12-02 ENCOUNTER — Ambulatory Visit: Payer: Medicare Other | Admitting: Podiatry

## 2021-07-10 ENCOUNTER — Ambulatory Visit: Payer: Self-pay

## 2021-07-10 NOTE — Telephone Encounter (Signed)
Pt hit right side of head at 1200. Pt bent over and when she went to stand up, she nit her head on corner of dresser. Pt stated no bleeding, no cuts.  Pt stated she has a marble sized swelling to the area. Pt stated she is pain rated 7/10. Pt is alert to time, place, name, situation and location and my name as a triage nurse.  Denies LOC, nausea or vomiting, weakness, or numbness to either side of face, arms or legs. 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  Care advice given to pt and pt verbalized understanding.       Reason for Disposition  Taking Coumadin (warfarin) or other strong blood thinner, or known bleeding disorder (e.g., thrombocytopenia)  Answer Assessment - Initial Assessment Questions 1. MECHANISM: "How did the injury happen?" For falls, ask: "What height did you fall from?" and "What surface did you fall against?"      Hit on corner of dresser 15 minutes 2. ONSET: "When did the injury happen?" (Minutes or hours ago)      1200 3. NEUROLOGIC SYMPTOMS: "Was there any loss of consciousness?" "Are there any other neurological symptoms?"      No-no 4. MENTAL STATUS: "Does the person know who they are, who you are, and where they are?"      yes 5. LOCATION: "What part of the head was hit?"     Top of head 6. SCALP APPEARANCE: "What does the scalp look like? Is it bleeding now?" If Yes, ask: "Is it difficult to stop?"      Knot side 7. SIZE: For cuts, bruises, or swelling, ask: "How large is it?" (e.g., inches or centimeters)      Marble sized- red 8. PAIN: "Is there any pain?" If Yes, ask: "How bad is it?"  (e.g., Scale 1-10; or mild, moderate, severe)     Yes aches / 7/10 9. TETANUS: For any breaks in the skin, ask: "When was the last tetanus booster?"     no 10. OTHER SYMPTOMS: "Do you have any other symptoms?" (e.g., neck pain, vomiting)       none 11. PREGNANCY: "Is there any chance you are pregnant?" "When was your last  menstrual period?"       N/a  Protocols used: Head Injury-A-AH

## 2021-07-17 ENCOUNTER — Emergency Department (HOSPITAL_COMMUNITY)
Admission: EM | Admit: 2021-07-17 | Discharge: 2021-07-17 | Disposition: A | Discharge status: Home / Self Care | Payer: Medicare Other | Attending: Emergency Medicine | Admitting: Emergency Medicine

## 2021-07-17 ENCOUNTER — Emergency Department (HOSPITAL_COMMUNITY): Payer: Medicare Other

## 2021-07-17 ENCOUNTER — Other Ambulatory Visit: Payer: Self-pay

## 2021-07-17 DIAGNOSIS — Z7902 Long term (current) use of antithrombotics/antiplatelets: Secondary | ICD-10-CM | POA: Diagnosis not present

## 2021-07-17 DIAGNOSIS — S0990XA Unspecified injury of head, initial encounter: Secondary | ICD-10-CM | POA: Diagnosis present

## 2021-07-17 DIAGNOSIS — Z79899 Other long term (current) drug therapy: Secondary | ICD-10-CM | POA: Insufficient documentation

## 2021-07-17 DIAGNOSIS — Z7951 Long term (current) use of inhaled steroids: Secondary | ICD-10-CM | POA: Diagnosis not present

## 2021-07-17 DIAGNOSIS — Z87891 Personal history of nicotine dependence: Secondary | ICD-10-CM | POA: Insufficient documentation

## 2021-07-17 DIAGNOSIS — J45909 Unspecified asthma, uncomplicated: Secondary | ICD-10-CM | POA: Insufficient documentation

## 2021-07-17 DIAGNOSIS — Z7984 Long term (current) use of oral hypoglycemic drugs: Secondary | ICD-10-CM | POA: Insufficient documentation

## 2021-07-17 DIAGNOSIS — I10 Essential (primary) hypertension: Secondary | ICD-10-CM | POA: Diagnosis not present

## 2021-07-17 DIAGNOSIS — J441 Chronic obstructive pulmonary disease with (acute) exacerbation: Secondary | ICD-10-CM | POA: Diagnosis not present

## 2021-07-17 DIAGNOSIS — S0511XA Contusion of eyeball and orbital tissues, right eye, initial encounter: Secondary | ICD-10-CM | POA: Diagnosis not present

## 2021-07-17 DIAGNOSIS — E119 Type 2 diabetes mellitus without complications: Secondary | ICD-10-CM | POA: Insufficient documentation

## 2021-07-17 DIAGNOSIS — S0993XA Unspecified injury of face, initial encounter: Secondary | ICD-10-CM

## 2021-07-17 DIAGNOSIS — W01198A Fall on same level from slipping, tripping and stumbling with subsequent striking against other object, initial encounter: Secondary | ICD-10-CM | POA: Insufficient documentation

## 2021-07-17 DIAGNOSIS — Z91040 Latex allergy status: Secondary | ICD-10-CM | POA: Insufficient documentation

## 2021-07-17 NOTE — ED Triage Notes (Signed)
Pt on Xarelto-reports tripping over something in her bedroom on Wednesday, hitting her head on the dresser. Had hematoma to head, which has decreased in size; however, now has black eye and continued pain to site where she was hit.

## 2021-07-17 NOTE — ED Provider Notes (Signed)
[facility] EMERGENCY DEPARTMENT Provider Note   CSN: [id number] Arrival date & time: 07/17/21  1352     History Chief Complaint  Patient presents with   Teresa Kerr is a 70 y.o. female presenting for evaluation after head injury.  Patient states 5 days ago she leaned forward to pick something up, sharply stood up and hit her head on the corner of a dresser.  She reports acute onset pain and swelling.  No loss of consciousness.  She applied ice and states she felt okay, so she did not seek evaluation.  Over the next few days, the swelling improved, but she developed bruising around her eye which continued to spread.  She continues to have mild discomfort in this area, has tried Tylenol without improvement of symptoms.  She denies vision loss or changes.  No dizziness, lightheadedness, slurred speech, neck pain, chest pain.  She is on Xarelto, has been taking it as prescribed.  She denies injury elsewhere.  Pain is constant, nothing makes it better or worse.  It does not radiate  Additional history taken chart reviewed.  Patient with a history of asthma, diabetes, hypertension, previous stroke with residual right-sided deficits, COPD, chronic anticoagulation.  HPI     Past Medical History:  Diagnosis Date   Asthma    Diabetes mellitus    Gout    Hypertension    Neuromuscular disorder ([place])    right sides weakness arm and leg, steroid injections to right shoulder-Dr. Beane(last 8'16)   Stroke ([place]) 2002, 1999   pt has right sided weakness at times, gets [facility] when weakness returns   Umbilical hernia    Uterine fibroid     Patient Active Problem List   Diagnosis Date Noted   Impingement syndrome of right shoulder region 09/09/2019   Degeneration of lumbar intervertebral disc 06/03/2018   Hip pain 06/03/2018   Knee pain 06/03/2018   RBBB 01/31/2017   Elevated troponin 01/31/2017   Influenza 01/16/2017   Anxiety 01/16/2017   Chest pain  01/16/2017   Diabetes mellitus with complication (HCC)    COPD exacerbation (HCC)    Influenza B    Thyroid nodule    Acute respiratory failure with hypoxia (HCC)    Pleural effusion, R, ? hemothorax 04/24/2012   Community acquired pneumonia 04/20/2012   Hypoxia 04/20/2012   Shingles 04/20/2012   Rib pain 04/20/2012   Hypertension 04/20/2012   History of stroke 04/20/2012   Chronic anticoagulation 04/20/2012   Diabetes mellitus ([place]) 04/20/2012   FIBROIDS, UTERUS 05/16/2007   HX, PERSONAL, TOBACCO USE 05/16/2007   GOUT 05/14/2007   OBESITY 05/14/2007   Essential hypertension 05/14/2007   ALLERGIC RHINITIS, SEASONAL 05/14/2007   Asthma 05/14/2007    Past Surgical History:  Procedure Laterality Date   ANGIOPLASTY     x 2 times-cerebral arterial stents- [place]   CYSTOSCOPY WITH RETROGRADE PYELOGRAM, URETEROSCOPY AND STENT PLACEMENT Left 09/01/2015   Procedure: CYSTOSCOPY WITH LEFT RETROGRADE, LEFT URETEROSCOPY AND STENT PLACEMENT;  Surgeon: Irine Seal, MD;  Location: [facility];  Service: Urology;  Laterality: Left;   HAND SURGERY     HOLMIUM LASER APPLICATION Left 54/02/5680   Procedure: WITH HOLMIUM LASER ;  Surgeon: Irine Seal, MD;  Location: [facility];  Service: Urology;  Laterality: Left;   IR ANGIO INTRA EXTRACRAN SEL COM CAROTID INNOMINATE BILAT MOD SED  03/04/2018   IR ANGIO VERTEBRAL SEL SUBCLAVIAN INNOMINATE UNI R MOD SED  03/04/2018   IR  ANGIO VERTEBRAL SEL VERTEBRAL UNI L MOD SED  03/04/2018   TOE SURGERY       OB History     Gravida  3   Para  3   Term  3   Preterm      AB      Living  3      SAB      IAB      Ectopic      Multiple      Live Births              Family History  Problem Relation Age of Onset   Cancer Mother        breast/ had mastecomy   Cancer Father        colon    Social History   Tobacco Use   Smoking status: Former    Packs/day: 1.50    Years: 34.00    Pack years: 51.00    Types: Cigarettes    Quit date:  09/24/1997    Years since quitting: 23.8   Smokeless tobacco: Never  Vaping Use   Vaping Use: Never used  Substance Use Topics   Alcohol use: No   Drug use: No    Home Medications Prior to Admission medications   Medication Sig Start Date End Date Taking? Authorizing Provider  acetaminophen (TYLENOL) 325 MG tablet Take 325-650 mg by mouth every 6 (six) hours as needed for mild pain, moderate pain, fever or headache.    [provider]  allopurinol (ZYLOPRIM) 100 MG tablet Take 100 mg by mouth daily. 01/04/17   [provider]  amLODipine (NORVASC) 10 MG tablet Take 10 mg by mouth daily.      [provider]  atorvastatin (LIPITOR) 10 MG tablet Take 10 mg by mouth daily at 6 PM.  01/25/15   [provider]  azithromycin (ZITHROMAX) 250 MG tablet azithromycin 250 mg tablet    [provider]  cloNIDine (CATAPRES) 0.1 MG tablet Take 0.1 mg by mouth 2 (two) times daily. 08/22/15   [provider]  clopidogrel (PLAVIX) 75 MG tablet Take 75 mg by mouth daily.  05/01/12   [provider]  colchicine 0.6 MG tablet Take 0.6-1.2 mg by mouth See admin instructions. 1.2 mg with flares; take 0.6 mg if still present after one hour    [provider]  cyclobenzaprine (FLEXERIL) 5 MG tablet cyclobenzaprine 5 mg tablet  TAKE 1 TABLET BY MOUTH 2 TIMES DAILY    [provider]  DEXILANT 30 MG capsule Take 30 mg by mouth daily. 11/26/16   [provider]  diazepam (VALIUM) 5 MG tablet diazepam 5 mg tablet  TAKE 1 OR 2 TABLETS BY MOUTH 1 HOUR PRIOR TO PROCEDURE    [provider]  Fluticasone-Salmeterol (ADVAIR) 250-50 MCG/DOSE AEPB Inhale 2 puffs into the lungs daily as needed (for flares).     [provider]  furosemide (LASIX) 20 MG tablet Take 20 mg by mouth daily.     [provider]  gabapentin (NEURONTIN) 300 MG capsule Take 300 mg by mouth 2 (two) times daily.    [provider]   ipratropium-albuterol (DUONEB) 0.5-2.5 (3) MG/3ML SOLN Take 3 mLs by nebulization every 4 (four) hours as needed. 01/18/17   Jani Gravel, MD  JANUVIA 100 MG tablet Take 100 mg by mouth daily. 05/19/19   [provider]  LEVEMIR FLEXTOUCH 100 UNIT/ML Pen Inject 50 Units into the skin at  bedtime. Patient taking differently: Inject into the skin at bedtime. Per pt: 54u at night, and 8u q AM 01/18/17   Jani Gravel, MD  lisinopril (PRINIVIL,ZESTRIL) 40 MG tablet Take 40 mg by mouth daily.  01/05/15   [provider]  LORazepam (ATIVAN) 1 MG tablet 1 TABLET TO BE TAKEN AT OFFICE VISIT ON 9/23. MUST HAVE SOMEONE TO DRIVE FOLLOWING APPOINTMENT 06/11/19   [provider]  losartan (COZAAR) 50 MG tablet Take 50 mg by mouth daily. 06/02/19   [provider]  mupirocin nasal ointment (BACTROBAN) 2 % Apply in each nostril daily 01/31/18   Wardell Honour, MD  ondansetron (ZOFRAN) 4 MG tablet Take 1 tablet (4 mg total) by mouth every 8 (eight) hours as needed for nausea or vomiting. 08/09/15   Hoyle Sauer, MD  PROAIR RESPICLICK 283 (90 BASE) MCG/ACT AEPB Inhale 2 puffs into the lungs every 6 (six) hours as needed (shortness of breath).  10/25/14   [provider]  ranitidine (ZANTAC) 150 MG tablet ranitidine 150 mg tablet    [provider]  Rivaroxaban 15 & 20 MG TBPK Take as directed on package: Start with one 15mg  tablet by mouth twice a day with food. On Day 22, switch to one 20mg  tablet once a day with food. 12/05/18   Nuala Alpha A, PA-C  terconazole (TERAZOL 7) 0.4 % vaginal cream terconazole 0.4 % vaginal cream  Insert 1 application vaginally at bedtime for 3 nights    [provider]  tiZANidine (ZANAFLEX) 2 MG tablet Take 2 mg by mouth 2 (two) times daily. 01/04/17   [provider]  traMADol (ULTRAM) 50 MG tablet Take 1 tablet (50 mg total) by mouth every 6 (six) hours as needed. 09/21/17   Duanne Guess, PA-C    Allergies     Hydrocodone-acetaminophen and Latex  Review of Systems   Review of Systems  Neurological:  Positive for headaches.  Hematological:  Bruises/bleeds easily.  All other systems reviewed and are negative.  Physical Exam Updated Vital Signs BP (!) 161/102 (BP Location: Right Arm)   Pulse 93   Temp 98.5 F (36.9 C)   Resp 15   SpO2 97%   Physical Exam Vitals and nursing note reviewed.  Constitutional:      General: She is not in acute distress.    Appearance: She is well-developed.  HENT:     Head: Normocephalic.      Comments: Periorbital contusion, mostly on the upper aspect of the right eyelid.  Mild tenderness and swelling.  No subconjunctival hemorrhage.  No pain or difficulty moving the eyes in any direction.  No pain over the nasal bridge.  No nasal septal hematoma.  No trismus or malocclusion. Eyes:     Extraocular Movements: Extraocular movements intact.  Cardiovascular:     Rate and Rhythm: Normal rate.  Pulmonary:     Effort: Pulmonary effort is normal.  Abdominal:     General: There is no distension.  Musculoskeletal:        General: Normal range of motion.     Cervical back: Normal range of motion.  Skin:    General: Skin is warm.     Findings: No rash.  Neurological:     Mental Status: She is alert and oriented to person, place, and time.    ED Results / Procedures / Treatments   Labs (all labs ordered are listed, but only abnormal results are displayed) Labs Reviewed - No data to  display  EKG None  Radiology CT Head Wo Contrast  Result Date: 07/17/2021 CLINICAL DATA:  Trauma, tripped, on blood thinners EXAM: CT HEAD WITHOUT CONTRAST TECHNIQUE: Contiguous axial images were obtained from the base of the skull through the vertex without intravenous contrast. COMPARISON:  Brain MRI 01/20/2020 FINDINGS: Brain: There is no acute intracranial hemorrhage, extra-axial fluid collection, or acute infarct. There is encephalomalacia in the left posterior temporal  lobe and frontal lobe consistent with prior infarcts, also present on the prior brain MRI. The ventricles are stable in size. Foci of hypodensity in the remainder of the subcortical and periventricular white matter likely reflect sequela of chronic white matter microangiopathy. There is no mass lesion. There is no midline shift. 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 Vascular: There is calcification of the bilateral cavernous ICAs. Skull: Normal. Negative for fracture or focal lesion. Sinuses/Orbits: The imaged paranasal sinuses are clear. The globes and orbits are unremarkable. Other: None. IMPRESSION: 1. No acute intracranial hemorrhage or calvarial fracture. 2. Unchanged encephalomalacia in the left cerebral hemisphere consistent with prior infarct. Electronically Signed   By: Valetta Mole M.D.   On: 07/17/2021 15:21    Procedures Procedures   Medications Ordered in ED Medications - No data to display  ED Course  I have reviewed the triage vital signs and the nursing notes.  Pertinent labs & imaging results that were available during my care of the patient were reviewed by me and considered in my medical decision making (see chart for details).    MDM Rules/Calculators/A&P                           Patient presenting for evaluation of continued pain and spreading contusion around the right eye.  On exam, patient appears nontoxic.  She is not having any worsening symptoms, to spreading of the contusion.  CT obtained from triage negative for acute findings.  Discussed with patient typical dispersement of coagulated blood after an injury.  As she has no pain of the eyeball itself, no vision changes and no difficulty moving her eye, doubt globe injury and I do not feel she needs further emergent evaluation.  Discussed continued symptomatic management with Tylenol and ice.  Discussed typical course of healing.  At this time, patient appears safe for discharge.  Return precautions given.  Patient  states she understands and agrees to plan.   Final Clinical Impression(s) / ED Diagnoses Final diagnoses:  Facial injury, initial encounter  Periorbital contusion of right eye, initial encounter    Rx / DC Orders ED Discharge Orders     None        Franchot Heidelberg, PA-C 07/17/21 1907    Tegeler, Gwenyth Allegra, MD 07/18/21 [phone]

## 2021-07-17 NOTE — Discharge Instructions (Signed)
Take Tylenol as needed for pain. Use ice for 20 minutes at a time 3-4 times a day to help with pain and swelling. You will likely have a pain, color change, and swelling for the next few days to weeks.  This is normal and should gradually improve. Follow-up with your primary care doctor as needed for recheck. Return to the emergency room if develop severe worsening pain, vision loss, confusion/dizziness/weakness, or any new, worsening, concerning symptoms

## 2021-09-27 ENCOUNTER — Other Ambulatory Visit: Payer: Self-pay | Admitting: Internal Medicine

## 2021-09-28 LAB — SARS-COV-2 RNA,(COVID-19) QUALITATIVE NAAT: SARS CoV2 RNA: DETECTED — AB

## 2021-09-28 LAB — INFLUENZA A AND B AG, IMMUNOASSAY
INFLUENZA A ANTIGEN: NOT DETECTED
INFLUENZA B ANTIGEN: NOT DETECTED
MICRO NUMBER:: 12828871
SPECIMEN QUALITY:: ADEQUATE

## 2021-12-15 ENCOUNTER — Ambulatory Visit: Payer: Medicare Other | Admitting: Podiatry

## 2022-03-30 ENCOUNTER — Ambulatory Visit
Admission: RE | Admit: 2022-03-30 | Discharge: 2022-03-30 | Disposition: A | Discharge status: Home / Self Care | Payer: Medicare Other | Source: Ambulatory Visit | Attending: Internal Medicine | Admitting: Internal Medicine

## 2022-03-30 ENCOUNTER — Other Ambulatory Visit: Payer: Self-pay | Admitting: Internal Medicine

## 2022-03-30 ENCOUNTER — Other Ambulatory Visit: Payer: Self-pay | Admitting: Family Medicine

## 2022-03-30 DIAGNOSIS — M25551 Pain in right hip: Secondary | ICD-10-CM

## 2022-06-07 ENCOUNTER — Other Ambulatory Visit: Payer: Self-pay | Admitting: Family Medicine

## 2022-06-07 DIAGNOSIS — Z1231 Encounter for screening mammogram for malignant neoplasm of breast: Secondary | ICD-10-CM

## 2022-07-06 ENCOUNTER — Ambulatory Visit: Payer: Medicare Other

## 2022-08-21 ENCOUNTER — Ambulatory Visit: Payer: Medicare Other

## 2022-10-24 ENCOUNTER — Ambulatory Visit: Payer: Medicare Other | Admitting: Obstetrics & Gynecology

## 2022-12-28 ENCOUNTER — Encounter (INDEPENDENT_AMBULATORY_CARE_PROVIDER_SITE_OTHER): Payer: 59 | Admitting: Ophthalmology

## 2023-01-02 ENCOUNTER — Encounter (INDEPENDENT_AMBULATORY_CARE_PROVIDER_SITE_OTHER): Payer: 59 | Admitting: Ophthalmology

## 2023-01-02 DIAGNOSIS — H3581 Retinal edema: Secondary | ICD-10-CM

## 2023-01-14 ENCOUNTER — Encounter (INDEPENDENT_AMBULATORY_CARE_PROVIDER_SITE_OTHER): Payer: 59 | Admitting: Ophthalmology

## 2023-02-05 ENCOUNTER — Encounter (INDEPENDENT_AMBULATORY_CARE_PROVIDER_SITE_OTHER): Payer: 59 | Admitting: Ophthalmology

## 2023-02-05 DIAGNOSIS — H3581 Retinal edema: Secondary | ICD-10-CM

## 2023-02-05 DIAGNOSIS — Z794 Long term (current) use of insulin: Secondary | ICD-10-CM

## 2023-03-18 ENCOUNTER — Encounter (HOSPITAL_COMMUNITY): Payer: Self-pay

## 2023-03-18 ENCOUNTER — Observation Stay (HOSPITAL_COMMUNITY)
Admission: EM | Admit: 2023-03-18 | Discharge: 2023-03-19 | Disposition: A | Discharge status: Home / Self Care | Payer: 59 | Attending: Family Medicine | Admitting: Family Medicine

## 2023-03-18 ENCOUNTER — Observation Stay (HOSPITAL_COMMUNITY): Payer: 59

## 2023-03-18 ENCOUNTER — Other Ambulatory Visit: Payer: Self-pay

## 2023-03-18 ENCOUNTER — Emergency Department (HOSPITAL_COMMUNITY): Payer: 59

## 2023-03-18 DIAGNOSIS — Z7984 Long term (current) use of oral hypoglycemic drugs: Secondary | ICD-10-CM | POA: Insufficient documentation

## 2023-03-18 DIAGNOSIS — Z8673 Personal history of transient ischemic attack (TIA), and cerebral infarction without residual deficits: Secondary | ICD-10-CM | POA: Insufficient documentation

## 2023-03-18 DIAGNOSIS — Z6841 Body Mass Index (BMI) 40.0 and over, adult: Secondary | ICD-10-CM | POA: Diagnosis not present

## 2023-03-18 DIAGNOSIS — R4781 Slurred speech: Secondary | ICD-10-CM | POA: Insufficient documentation

## 2023-03-18 DIAGNOSIS — J45909 Unspecified asthma, uncomplicated: Secondary | ICD-10-CM | POA: Diagnosis not present

## 2023-03-18 DIAGNOSIS — I10 Essential (primary) hypertension: Secondary | ICD-10-CM | POA: Diagnosis not present

## 2023-03-18 DIAGNOSIS — G629 Polyneuropathy, unspecified: Secondary | ICD-10-CM | POA: Insufficient documentation

## 2023-03-18 DIAGNOSIS — Z79899 Other long term (current) drug therapy: Secondary | ICD-10-CM | POA: Insufficient documentation

## 2023-03-18 DIAGNOSIS — E119 Type 2 diabetes mellitus without complications: Secondary | ICD-10-CM | POA: Diagnosis not present

## 2023-03-18 DIAGNOSIS — I639 Cerebral infarction, unspecified: Secondary | ICD-10-CM

## 2023-03-18 DIAGNOSIS — I82401 Acute embolism and thrombosis of unspecified deep veins of right lower extremity: Secondary | ICD-10-CM | POA: Diagnosis not present

## 2023-03-18 DIAGNOSIS — Z91040 Latex allergy status: Secondary | ICD-10-CM | POA: Diagnosis not present

## 2023-03-18 DIAGNOSIS — E118 Type 2 diabetes mellitus with unspecified complications: Secondary | ICD-10-CM | POA: Diagnosis present

## 2023-03-18 DIAGNOSIS — I2510 Atherosclerotic heart disease of native coronary artery without angina pectoris: Secondary | ICD-10-CM | POA: Insufficient documentation

## 2023-03-18 DIAGNOSIS — R29818 Other symptoms and signs involving the nervous system: Secondary | ICD-10-CM | POA: Diagnosis not present

## 2023-03-18 DIAGNOSIS — E669 Obesity, unspecified: Secondary | ICD-10-CM | POA: Diagnosis present

## 2023-03-18 DIAGNOSIS — Z7902 Long term (current) use of antithrombotics/antiplatelets: Secondary | ICD-10-CM | POA: Insufficient documentation

## 2023-03-18 DIAGNOSIS — Z7901 Long term (current) use of anticoagulants: Secondary | ICD-10-CM | POA: Diagnosis not present

## 2023-03-18 DIAGNOSIS — E785 Hyperlipidemia, unspecified: Secondary | ICD-10-CM | POA: Diagnosis not present

## 2023-03-18 DIAGNOSIS — R2990 Unspecified symptoms and signs involving the nervous system: Secondary | ICD-10-CM | POA: Diagnosis present

## 2023-03-18 DIAGNOSIS — Z87891 Personal history of nicotine dependence: Secondary | ICD-10-CM | POA: Insufficient documentation

## 2023-03-18 DIAGNOSIS — Z794 Long term (current) use of insulin: Secondary | ICD-10-CM | POA: Insufficient documentation

## 2023-03-18 DIAGNOSIS — Z9862 Peripheral vascular angioplasty status: Secondary | ICD-10-CM | POA: Diagnosis not present

## 2023-03-18 DIAGNOSIS — W19XXXA Unspecified fall, initial encounter: Secondary | ICD-10-CM

## 2023-03-18 LAB — DIFFERENTIAL
Abs Immature Granulocytes: 0.05 10*3/uL (ref 0.00–0.07)
Basophils Absolute: 0.1 10*3/uL (ref 0.0–0.1)
Basophils Relative: 1 %
Eosinophils Absolute: 0.1 10*3/uL (ref 0.0–0.5)
Eosinophils Relative: 1 %
Immature Granulocytes: 0 %
Lymphocytes Relative: 21 %
Lymphs Abs: 2.5 10*3/uL (ref 0.7–4.0)
Monocytes Absolute: 1 10*3/uL (ref 0.1–1.0)
Monocytes Relative: 8 %
Neutro Abs: 8.4 10*3/uL — ABNORMAL HIGH (ref 1.7–7.7)
Neutrophils Relative %: 69 %

## 2023-03-18 LAB — APTT: aPTT: 30 seconds (ref 24–36)

## 2023-03-18 LAB — CBC
HCT: 43.3 % (ref 36.0–46.0)
Hemoglobin: 14.3 g/dL (ref 12.0–15.0)
MCH: 28.4 pg (ref 26.0–34.0)
MCHC: 33 g/dL (ref 30.0–36.0)
MCV: 86.1 fL (ref 80.0–100.0)
Platelets: 257 10*3/uL (ref 150–400)
RBC: 5.03 MIL/uL (ref 3.87–5.11)
RDW: 12.8 % (ref 11.5–15.5)
WBC: 12 10*3/uL — ABNORMAL HIGH (ref 4.0–10.5)
nRBC: 0 % (ref 0.0–0.2)

## 2023-03-18 LAB — PROTIME-INR
INR: 1.6 — ABNORMAL HIGH (ref 0.8–1.2)
Prothrombin Time: 19.7 seconds — ABNORMAL HIGH (ref 11.4–15.2)

## 2023-03-18 LAB — COMPREHENSIVE METABOLIC PANEL
ALT: 19 U/L (ref 0–44)
AST: 19 U/L (ref 15–41)
Albumin: 3.3 g/dL — ABNORMAL LOW (ref 3.5–5.0)
Alkaline Phosphatase: 81 U/L (ref 38–126)
Anion gap: 9 (ref 5–15)
BUN: 19 mg/dL (ref 8–23)
CO2: 25 mmol/L (ref 22–32)
Calcium: 8.8 mg/dL — ABNORMAL LOW (ref 8.9–10.3)
Chloride: 101 mmol/L (ref 98–111)
Creatinine, Ser: 1.11 mg/dL — ABNORMAL HIGH (ref 0.44–1.00)
GFR, Estimated: 54 mL/min — ABNORMAL LOW (ref >60)
Glucose, Bld: 324 mg/dL — ABNORMAL HIGH (ref 70–99)
Potassium: 3.8 mmol/L (ref 3.5–5.1)
Sodium: 135 mmol/L (ref 135–145)
Total Bilirubin: 0.5 mg/dL (ref 0.3–1.2)
Total Protein: 6.9 g/dL (ref 6.5–8.1)

## 2023-03-18 LAB — RAPID URINE DRUG SCREEN, HOSP PERFORMED
Amphetamines: NOT DETECTED
Barbiturates: NOT DETECTED
Benzodiazepines: NOT DETECTED
Cocaine: NOT DETECTED
Opiates: NOT DETECTED
Tetrahydrocannabinol: NOT DETECTED

## 2023-03-18 LAB — CBG MONITORING, ED
Glucose-Capillary: 293 mg/dL — ABNORMAL HIGH (ref 70–99)
Glucose-Capillary: 302 mg/dL — ABNORMAL HIGH (ref 70–99)

## 2023-03-18 LAB — GLUCOSE, CAPILLARY: Glucose-Capillary: 164 mg/dL — ABNORMAL HIGH (ref 70–99)

## 2023-03-18 LAB — I-STAT CHEM 8, ED
BUN: 22 mg/dL (ref 8–23)
Calcium, Ion: 1.14 mmol/L — ABNORMAL LOW (ref 1.15–1.40)
Chloride: 102 mmol/L (ref 98–111)
Creatinine, Ser: 1.1 mg/dL — ABNORMAL HIGH (ref 0.44–1.00)
Glucose, Bld: 321 mg/dL — ABNORMAL HIGH (ref 70–99)
HCT: 44 % (ref 36.0–46.0)
Hemoglobin: 15 g/dL (ref 12.0–15.0)
Potassium: 4 mmol/L (ref 3.5–5.1)
Sodium: 138 mmol/L (ref 135–145)
TCO2: 28 mmol/L (ref 22–32)

## 2023-03-18 LAB — HEMOGLOBIN A1C
Hgb A1c MFr Bld: 12.3 % — ABNORMAL HIGH (ref 4.8–5.6)
Mean Plasma Glucose: 306.31 mg/dL

## 2023-03-18 LAB — ETHANOL: Alcohol, Ethyl (B): <10 mg/dL (ref <10)

## 2023-03-18 MED ORDER — ASPIRIN 325 MG PO TBEC
325.0000 mg | DELAYED_RELEASE_TABLET | Freq: Every day | ORAL | Status: DC
  Administered 2023-03-18 – 2023-03-19 (×2): 325 mg via ORAL
  Filled 2023-03-18 (×2): qty 1

## 2023-03-18 MED ORDER — INSULIN ASPART 100 UNIT/ML IJ SOLN: 0.0000 [IU] | Freq: Every day | INTRAMUSCULAR | Status: DC

## 2023-03-18 MED ORDER — IOHEXOL 350 MG/ML SOLN
100.0000 mL | Freq: Once | INTRAVENOUS | Status: AC | PRN
  Administered 2023-03-18: 100 mL via INTRAVENOUS

## 2023-03-18 MED ORDER — ASPIRIN 300 MG RE SUPP
300.0000 mg | Freq: Every day | RECTAL | Status: DC
  Filled 2023-03-18: qty 1

## 2023-03-18 MED ORDER — ACETAMINOPHEN 650 MG RE SUPP: 650.0000 mg | RECTAL | Status: DC | PRN

## 2023-03-18 MED ORDER — ACETAMINOPHEN 160 MG/5ML PO SOLN: 650.0000 mg | ORAL | Status: DC | PRN

## 2023-03-18 MED ORDER — SENNOSIDES-DOCUSATE SODIUM 8.6-50 MG PO TABS: 1.0000 | ORAL_TABLET | Freq: Every evening | ORAL | Status: DC | PRN

## 2023-03-18 MED ORDER — DULOXETINE HCL 20 MG PO CPEP
20.0000 mg | ORAL_CAPSULE | Freq: Every day | ORAL | Status: DC
  Administered 2023-03-18 – 2023-03-19 (×2): 20 mg via ORAL
  Filled 2023-03-18 (×2): qty 1

## 2023-03-18 MED ORDER — ENOXAPARIN SODIUM 40 MG/0.4ML IJ SOSY
40.0000 mg | PREFILLED_SYRINGE | INTRAMUSCULAR | Status: DC
  Administered 2023-03-18: 40 mg via SUBCUTANEOUS
  Filled 2023-03-18: qty 0.4

## 2023-03-18 MED ORDER — ONDANSETRON HCL 4 MG/2ML IJ SOLN: 4.0000 mg | Freq: Four times a day (QID) | INTRAMUSCULAR | Status: DC | PRN

## 2023-03-18 MED ORDER — INSULIN DETEMIR 100 UNIT/ML FLEXPEN: 8.0000 [IU] | PEN_INJECTOR | Freq: Two times a day (BID) | SUBCUTANEOUS | Status: DC

## 2023-03-18 MED ORDER — ATORVASTATIN CALCIUM 40 MG PO TABS
40.0000 mg | ORAL_TABLET | Freq: Every day | ORAL | Status: DC
  Administered 2023-03-18: 40 mg via ORAL
  Filled 2023-03-18: qty 1

## 2023-03-18 MED ORDER — SODIUM CHLORIDE 0.9% FLUSH: 3.0000 mL | Freq: Once | INTRAVENOUS | Status: DC

## 2023-03-18 MED ORDER — GABAPENTIN 300 MG PO CAPS
300.0000 mg | ORAL_CAPSULE | Freq: Two times a day (BID) | ORAL | Status: DC
  Administered 2023-03-18 – 2023-03-19 (×2): 300 mg via ORAL
  Filled 2023-03-18 (×2): qty 1

## 2023-03-18 MED ORDER — INSULIN GLARGINE-YFGN 100 UNIT/ML ~~LOC~~ SOLN
15.0000 [IU] | Freq: Every day | SUBCUTANEOUS | Status: DC
  Administered 2023-03-18: 15 [IU] via SUBCUTANEOUS
  Filled 2023-03-18 (×2): qty 0.15

## 2023-03-18 MED ORDER — INSULIN ASPART 100 UNIT/ML IJ SOLN
0.0000 [IU] | Freq: Three times a day (TID) | INTRAMUSCULAR | Status: DC
  Administered 2023-03-18: 11 [IU] via SUBCUTANEOUS
  Administered 2023-03-19: 5 [IU] via SUBCUTANEOUS
  Administered 2023-03-19: 11 [IU] via SUBCUTANEOUS

## 2023-03-18 MED ORDER — STROKE: EARLY STAGES OF RECOVERY BOOK
Freq: Once | Status: AC
  Filled 2023-03-18: qty 1

## 2023-03-18 MED ORDER — ACETAMINOPHEN 325 MG PO TABS
650.0000 mg | ORAL_TABLET | ORAL | Status: DC | PRN
  Administered 2023-03-19 (×3): 650 mg via ORAL
  Filled 2023-03-18 (×3): qty 2

## 2023-03-18 MED ORDER — LORAZEPAM 1 MG PO TABS: 1.0000 mg | ORAL_TABLET | Freq: Once | ORAL | Status: DC | PRN

## 2023-03-18 MED ORDER — IPRATROPIUM-ALBUTEROL 0.5-2.5 (3) MG/3ML IN SOLN: 3.0000 mL | RESPIRATORY_TRACT | Status: DC | PRN

## 2023-03-18 NOTE — ED Provider Notes (Signed)
[place] EMERG[facility] DEPARTMENT AT [facility] Provider Note   CSN: [id number] Arrival date & time: 03/18/23  1039  An emergency department physician performed an initial assessment on this suspected stroke patient at 1042.  History  Chief Complaint  Patient presents with   Code Stroke    Teresa Kerr is a 72 y.o. female.  HPI Presents with concern of speech difficulty.  Patient has multiple medical problems including hypertension, DVT, on Xarelto, CAD.  Patient had last seen normal time about 14 hours ago.  At some point following that patient fell, and hit her head.  Patient also developed slurred speech, and per EMS patient had facial droop.  On arrival the patient noted to have slurred speech, facial droop, slight delay in speech.  He does have left-sided pain following the fall, described as diffuse.    Home Medications Prior to Admission medications   Medication Sig Start Date End Date Taking? Authorizing Provider  acetaminophen (TYLENOL) 325 MG tablet Take 325-650 mg by mouth every 6 (six) hours as needed for mild pain, moderate pain, fever or headache.    [provider]  allopurinol (ZYLOPRIM) 100 MG tablet Take 100 mg by mouth daily. 01/04/17   [provider]  amLODipine (NORVASC) 10 MG tablet Take 10 mg by mouth daily.      [provider]  atorvastatin (LIPITOR) 10 MG tablet Take 10 mg by mouth daily at 6 PM.  01/25/15   [provider]  azithromycin (ZITHROMAX) 250 MG tablet azithromycin 250 mg tablet    [provider]  cloNIDine (CATAPRES) 0.1 MG tablet Take 0.1 mg by mouth 2 (two) times daily. 08/22/15   [provider]  clopidogrel (PLAVIX) 75 MG tablet Take 75 mg by mouth daily.  05/01/12   [provider]  colchicine 0.6 MG tablet Take 0.6-1.2 mg by mouth See admin instructions. 1.2 mg with flares; take 0.6 mg if still present after one hour    [provider]  cyclobenzaprine  (FLEXERIL) 5 MG tablet cyclobenzaprine 5 mg tablet  TAKE 1 TABLET BY MOUTH 2 TIMES DAILY    [provider]  DEXILANT 30 MG capsule Take 30 mg by mouth daily. 11/26/16   [provider]  diazepam (VALIUM) 5 MG tablet diazepam 5 mg tablet  TAKE 1 OR 2 TABLETS BY MOUTH 1 HOUR PRIOR TO PROCEDURE    [provider]  Fluticasone-Salmeterol (ADVAIR) 250-50 MCG/DOSE AEPB Inhale 2 puffs into the lungs daily as needed (for flares).     [provider]  furosemide (LASIX) 20 MG tablet Take 20 mg by mouth daily.     [provider]  gabapentin (NEURONTIN) 300 MG capsule Take 300 mg by mouth 2 (two) times daily.    [provider]  ipratropium-albuterol (DUONEB) 0.5-2.5 (3) MG/3ML SOLN Take 3 mLs by nebulization every 4 (four) hours as needed. 01/18/17   Pearson Grippe, MD  JANUVIA 100 MG tablet Take 100 mg by mouth daily. 05/19/19   [provider]  LEVEMIR FLEXTOUCH 100 UNIT/ML Pen Inject 50 Units into the skin at bedtime. Patient taking differently: Inject into the skin at bedtime. Per pt: 54u at night, and 8u q AM 01/18/17   Pearson Grippe, MD  lisinopril (PRINIVIL,ZESTRIL) 40 MG tablet Take 40 mg by mouth daily.  01/05/15   [provider]  LORazepam (ATIVAN) 1 MG tablet 1 TABLET TO BE TAKEN AT OFFICE VISIT ON 9/23. MUST HAVE SOMEONE TO DRIVE FOLLOWING  APPOINTMENT 06/11/19   [provider]  losartan (COZAAR) 50 MG tablet Take 50 mg by mouth daily. 06/02/19   [provider]  mupirocin nasal ointment (BACTROBAN) 2 % Apply in each nostril daily 01/31/18   Frederica Kuster, MD  ondansetron (ZOFRAN) 4 MG tablet Take 1 tablet (4 mg total) by mouth every 8 (eight) hours as needed for nausea or vomiting. 08/09/15   Gavin Pound, MD  PROAIR RESPICLICK 108 (90 BASE) MCG/ACT AEPB Inhale 2 puffs into the lungs every 6 (six) hours as needed (shortness of breath).  10/25/14   [provider]  ranitidine (ZANTAC) 150 MG tablet  ranitidine 150 mg tablet    [provider]  Rivaroxaban 15 & 20 MG TBPK Take as directed on package: Start with one 15mg  tablet by mouth twice a day with food. On Day 22, switch to one 20mg  tablet once a day with food. 12/05/18   Harlene Salts A, PA-C  terconazole (TERAZOL 7) 0.4 % vaginal cream terconazole 0.4 % vaginal cream  Insert 1 application vaginally at bedtime for 3 nights    [provider]  tiZANidine (ZANAFLEX) 2 MG tablet Take 2 mg by mouth 2 (two) times daily. 01/04/17   [provider]  traMADol (ULTRAM) 50 MG tablet Take 1 tablet (50 mg total) by mouth every 6 (six) hours as needed. 09/21/17   Evon Slack, PA-C      Allergies    Hydrocodone-acetaminophen and Latex    Review of Systems   Review of Systems  Unable to perform ROS: Acuity of condition    Physical Exam Updated Vital Signs BP (!) 163/82   Pulse 78   Temp (!) 97.4 F (36.3 C) (Oral)   Resp 17   Ht 5\' 4"  (1.626 m)   Wt 106 kg Comment: code stroke weight  SpO2 100%   BMI 40.11 kg/m  Physical Exam Vitals and nursing note reviewed.  Constitutional:      General: She is not in acute distress.    Appearance: She is well-developed.  HENT:     Head: Normocephalic and atraumatic.  Eyes:     Conjunctiva/sclera: Conjunctivae normal.  Cardiovascular:     Rate and Rhythm: Normal rate and regular rhythm.  Pulmonary:     Effort: Pulmonary effort is normal. No respiratory distress.     Breath sounds: Normal breath sounds. No stridor.  Abdominal:     General: There is no distension.  Skin:    General: Skin is warm and dry.  Neurological:     Mental Status: She is alert and oriented to person, place, and time.     Cranial Nerves: Cranial nerve deficit and facial asymmetry present.     Comments: Speech is slow, seemingly deliberate, mild right facial asymmetry.  No obvious extremity weakness.  Patient follows commands appropriately.  Psychiatric:        Mood and Affect: Mood  normal.     ED Results / Procedures / Treatments   Labs (all labs ordered are listed, but only abnormal results are displayed) Labs Reviewed  PROTIME-INR - Abnormal; Notable for the following components:      Result Value   Prothrombin Time 19.7 (*)    INR 1.6 (*)    All other components within normal limits  CBC - Abnormal; Notable for the following components:   WBC 12.0 (*)    All other components within normal limits  DIFFERENTIAL - Abnormal; Notable for the following components:  Neutro Abs 8.4 (*)    All other components within normal limits  COMPREHENSIVE METABOLIC PANEL - Abnormal; Notable for the following components:   Glucose, Bld 324 (*)    Creatinine, Ser 1.11 (*)    Calcium 8.8 (*)    Albumin 3.3 (*)    GFR, Estimated 54 (*)    All other components within normal limits  I-STAT CHEM 8, ED - Abnormal; Notable for the following components:   Creatinine, Ser 1.10 (*)    Glucose, Bld 321 (*)    Calcium, Ion 1.14 (*)    All other components within normal limits  CBG MONITORING, ED - Abnormal; Notable for the following components:   Glucose-Capillary 293 (*)    All other components within normal limits  APTT  ETHANOL    EKG EKG Interpretation  Date/Time:  Monday March 18 2023 11:10:18 EDT Ventricular Rate:  81 PR Interval:  195 QRS Duration: 172 QT Interval:  451 QTC Calculation: 524 R Axis:   78 Text Interpretation: Sinus rhythm Right bundle branch block No significant change since last tracing Abnormal ECG Confirmed by Gerhard Munch [phone]) on 03/18/2023 11:13:36 AM  Radiology CT ANGIO HEAD NECK W WO CM W PERF (CODE STROKE)  Result Date: 03/18/2023 CLINICAL DATA:  Neuro deficit, acute, stroke suspected. Slurred speech. Left facial droop. EXAM: CT ANGIOGRAPHY HEAD AND NECK CT PERFUSION BRAIN TECHNIQUE: Multidetector CT imaging of the head and neck was performed using the standard protocol during bolus administration of intravenous contrast. Multiplanar CT  image reconstructions and MIPs were obtained to evaluate the vascular anatomy. Carotid stenosis measurements (when applicable) are obtained utilizing NASCET criteria, using the distal internal carotid diameter as the denominator. Multiphase CT imaging of the brain was performed following IV bolus contrast injection. Subsequent parametric perfusion maps were calculated using RAPID software. RADIATION DOSE REDUCTION: This exam was performed according to the departmental dose-optimization program which includes automated exposure control, adjustment of the mA and/or kV according to patient size and/or use of iterative reconstruction technique. CONTRAST:  OMNIPAQUE IOHEXOL 350 MG/ML SOLN COMPARISON:  Head CT immediately prior. FINDINGS: CTA NECK FINDINGS Aortic arch: Aortic atherosclerosis. Branching pattern is normal without origin stenosis. Right carotid system: Common carotid artery widely patent to the bifurcation. Soft and calcified plaque at the distal common carotid artery but no stenosis. Carotid bifurcation and ICA bulb widely patent. Cervical ICA normal beyond that. Left carotid system: Common carotid artery shows some scattered plaque but is widely patent to the bifurcation. Soft and calcified plaque at the distal common carotid artery and carotid bifurcation but no stenosis. Cervical ICA widely patent to the skull base. Vertebral arteries: Both vertebral artery origins are patent. Both vertebral arteries are patent through the cervical region to the foramen magnum. Skeleton: Mild cervical spondylosis. Other neck: Enlarged and heterogeneous thyroid gland with multiple nodules, measuring up to 4 cm in size. Non emergent thyroid ultrasound recommended for further evaluation. No regional lymphadenopathy. Enhancing mass of the left parotid gland measuring 1 cm in size. This is larger than was seen on a MRI study of February 2020. Parotid ultrasound recommended for further evaluation. Upper chest: Mild  emphysema.  No active process. Review of the MIP images confirms the above findings CTA HEAD FINDINGS Anterior circulation: Both internal carotid arteries are patent through the skull base and siphon regions. There is ordinary siphon atherosclerotic calcification but no stenosis greater than 30% suspected. On the right, the anterior and middle cerebral arteries are patent without flow limiting  stenosis. On the left, there is atherosclerotic change in the siphon but without stenosis greater than 30%. The anterior and middle cerebral vessels are patent. Mild chronic narrowing of the M1 segment without acute large vessel occlusion in that territory. Posterior circulation: Both vertebral arteries are patent through the foramen magnum to the basilar artery. The superior cerebellar and posterior cerebral arteries remain patent. There is pronounced atherosclerotic irregularity in the PCA branches, more extensive on the right than the left. Venous sinuses: Patent and normal. Anatomic variants: None other significant. Review of the MIP images confirms the above findings CT Brain Perfusion Findings: ASPECTS: 10, allowing for the old infarctions on the left. CBF (<30%) Volume: 0mL Perfusion (Tmax>6.0s) volume: 0mL Mismatch Volume: 0mL Infarction Location:None IMPRESSION: 1. No acute large vessel occlusion. 2. Atherosclerotic disease at both carotid bifurcations but without stenosis. 3. Atherosclerotic disease in both carotid siphon regions but without stenosis greater than 30%. 4. Chronic atherosclerotic narrowing of the left M1 segment without acute occlusion. 5. Pronounced atherosclerotic irregularity in the PCA branches, more extensive on the right than the left. 6. No acute perfusion abnormality. 7. Enlarged and heterogeneous thyroid gland with multiple nodules, measuring up to 4 cm in size. Non emergent thyroid ultrasound recommended for further evaluation. 8. Enhancing mass of the left parotid gland measuring 1 cm in  size. This is larger than was seen on a MRI study of February 2020. Parotid ultrasound recommended for further evaluation. Aortic Atherosclerosis (ICD10-I70.0) and Emphysema (ICD10-J43.9). Electronically Signed   By: Paulina Fusi M.D.   On: 03/18/2023 11:16   CT HEAD CODE STROKE WO CONTRAST  Result Date: 03/18/2023 CLINICAL DATA:  Code stroke. Neuro deficit, acute, stroke suspected. Slurred speech. Left-sided facial droop. EXAM: CT HEAD WITHOUT CONTRAST TECHNIQUE: Contiguous axial images were obtained from the base of the skull through the vertex without intravenous contrast. RADIATION DOSE REDUCTION: This exam was performed according to the departmental dose-optimization program which includes automated exposure control, adjustment of the mA and/or kV according to patient size and/or use of iterative reconstruction technique. COMPARISON:  07/17/2021 CT.  01/20/2020 MRI. FINDINGS: Brain: No focal abnormality seen affecting the brainstem or cerebellum. Right cerebral hemisphere shows chronic small-vessel ischemic changes, most pronounced in the forceps major region. Left cerebral hemisphere shows chronic small-vessel ischemic changes of the white matter and old cortical/subcortical infarction in the left posterior frontal lobe and left parietal lobe. No sign of acute infarction by CT. No mass, hemorrhage, hydrocephalus or extra-axial collection. Vascular: There is atherosclerotic calcification of the major vessels at the base of the brain. Skull: Negative Sinuses/Orbits: Clear/normal Other: None ASPECTS ([place] Stroke Program Early CT Score) - Ganglionic level infarction (caudate, lentiform nuclei, internal capsule, insula, M1-M3 cortex): 7, allowing for the old infarctions. - Supraganglionic infarction (M4-M6 cortex): 3, allowing for the old infarctions. Total score (0-10 with 10 being normal): 10, allowing for the old infarctions. IMPRESSION: 1. No acute finding by CT. Chronic small-vessel ischemic changes of  the cerebral hemispheric white matter. 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 2. Aspects is 10, allowing for the old infarctions. These results were communicated to Dr. Roda Shutters at 11:03 am on 03/18/2023 by text page via the [facility] messaging system. Electronically Signed   By: Paulina Fusi M.D.   On: 03/18/2023 11:04    Procedures Procedures    Medications Ordered in ED Medications  sodium chloride flush (NS) 0.9 % injection 3 mL (3 mLs Intravenous Not Given 03/18/23 1102)  iohexol (OMNIPAQUE) 350 MG/ML injection 100 mL (100 mLs Intravenous Contrast Given 03/18/23 1057)    ED Course/ Medical Decision Making/ A&P                             Medical Decision Making Patient with history of prior stroke, anticoagulated, presents after a fall.  Unclear the patient developed speech changes and facial asymmetry prior to the fall or subsequently.  Differential includes intracranial hemorrhage, stroke, medication effects.  Patient had evaluation expeditiously with our neurology colleagues, and with them we coordinated care.  Amount and/or Complexity of Data Reviewed Independent Historian: EMS External Data Reviewed: notes. Labs: ordered. Decision-making details documented in ED Course. Radiology: ordered and independent interpretation performed. Decision-making details documented in ED Course. ECG/medicine tests: ordered and independent interpretation performed. Decision-making details documented in ED Course.  Risk Decision regarding hospitalization.   Patient in no distress on repeat exam.  She does have left-sided pain, but no obvious deformity. Early steps of evaluation for stroke reassuring, no CT, CTA evidence for abnormality. Patient does have some evidence for enlargement of parotid gland.  On admission the patient is awaiting MRI, with concern for new neurochanges, with otherwise reassuring findings, patient mated to the hospitalist  team.        Final Clinical Impression(s) / ED Diagnoses Final diagnoses:  Slurred speech  Fall, initial encounter     Gerhard Munch, MD 03/18/23 1429

## 2023-03-18 NOTE — ED Notes (Signed)
ED TO INPATIENT HANDOFF REPORT  ED Nurse Name and Phone #: Theophilus Bones [phone]  S Name/Age/Gender Teresa Kerr 72 y.o. female Room/Bed: 037C/037C  Code Status   Code Status: Full Code  Home/SNF/Other Home Patient oriented to: self, place, time, and situation Is this baseline?  A&Ox4 baseline, forgetful at times  Triage Complete: Triage complete  Chief Complaint Stroke-like symptoms [R29.90]  Triage Note Pt BIB [facility] from home as Code Stroke, LKN 1230 this morning, also reports falling 3x in her bathroom & does remember hitting her head, unknown LOC. Does take Eliquis & Xarelto, Hx of strokes, Pt reports calling her home health aid at 0830 & toild her she felt "off" & having trouble speaking. EMS reports upon their arrival she was A/Ox4, gibberish speech & gate was normal & strengths equal. Upon arrival to ED her CBG was 293 & had Rt sided facial droop A& slurred speech as her presenting deficits. 18g Rt AC.   Allergies Allergies  Allergen Reactions   Norco [Hydrocodone-Acetaminophen] Itching   Latex Itching, Rash and Other (See Comments)    "Powdered gloves"     Level of Care/Admitting Diagnosis ED Disposition     ED Disposition  Admit   Condition  --   Comment  Hospital Area: [facility] [[id number]]  Level of Care: Telemetry Medical [104]  May place patient in observation at [facility] or [facility] if equivalent level of care is available:: No  Covid Evaluation: Asymptomatic - no recent exposure (last 10 days) testing not required  Diagnosis: Stroke-like symptoms [644034]  Admitting Physician: Jonah Blue [[id number]]  Attending Physician: Jonah Blue [[id number]]          B Medical/Surgery History Past Medical History:  Diagnosis Date   Asthma    Diabetes mellitus    Gout    Hypertension    Neuromuscular disorder (HCC)    right sides weakness arm and leg, steroid injections to right shoulder-Dr. Lennie Hummer 8'16)   Stroke (HCC)  2002, 1999   pt has right sided weakness at times, gets ESI when weakness returns   Umbilical hernia    Uterine fibroid    Past Surgical History:  Procedure Laterality Date   ANGIOPLASTY     x 2 times-cerebral arterial stents- [place]   CYSTOSCOPY WITH RETROGRADE PYELOGRAM, URETEROSCOPY AND STENT PLACEMENT Left 09/01/2015   Procedure: CYSTOSCOPY WITH LEFT RETROGRADE, LEFT URETEROSCOPY AND STENT PLACEMENT;  Surgeon: Bjorn Pippin, MD;  Location: [facility];  Service: Urology;  Laterality: Left;   HAND SURGERY     HOLMIUM LASER APPLICATION Left 09/01/2015   Procedure: WITH HOLMIUM LASER ;  Surgeon: Bjorn Pippin, MD;  Location: [facility];  Service: Urology;  Laterality: Left;   IR ANGIO INTRA EXTRACRAN SEL COM CAROTID INNOMINATE BILAT MOD SED  03/04/2018   IR ANGIO VERTEBRAL SEL SUBCLAVIAN INNOMINATE UNI R MOD SED  03/04/2018   IR ANGIO VERTEBRAL SEL VERTEBRAL UNI L MOD SED  03/04/2018   TOE SURGERY       A IV Location/Drains/Wounds Patient Lines/Drains/Airways Status     Active Line/Drains/Airways     Name Placement date Placement time Site Days   Peripheral IV 03/18/23 18 G Right Antecubital 03/18/23  1102  Antecubital  less than 1   Ureteral Drain/Stent 6 Fr. 09/01/15  1535  --  2755   Incision (Closed) 03/04/18 Groin Right 03/04/18  1004  -- 1840            Intake/Output Last 24 hours No  intake or output data in the 24 hours ending 03/18/23 1816  Labs/Imaging Results for orders placed or performed during the hospital encounter of 03/18/23 (from the past 48 hour(s))  CBG monitoring, ED     Status: Abnormal   Collection Time: 03/18/23 10:42 AM  Result Value Ref Range   Glucose-Capillary 293 (H) 70 - 99 mg/dL    Comment: Glucose reference range applies only to samples taken after fasting for at least 8 hours.  Protime-INR     Status: Abnormal   Collection Time: 03/18/23 10:43 AM  Result Value Ref Range   Prothrombin Time 19.7 (H) 11.4 - 15.2 seconds   INR 1.6 (H) 0.8 - 1.2     Comment: (NOTE) INR goal varies based on device and disease states. Performed at [facility], [address], [place], [place] [address]   APTT     Status: None   Collection Time: 03/18/23 10:43 AM  Result Value Ref Range   aPTT 30 24 - 36 seconds    Comment: Performed at [facility], [address] [place], [place] [address]  CBC     Status: Abnormal   Collection Time: 03/18/23 10:43 AM  Result Value Ref Range   WBC 12.0 (H) 4.0 - 10.5 K/uL   RBC 5.03 3.87 - 5.11 MIL/uL   Hemoglobin 14.3 12.0 - 15.0 g/dL   HCT 82.9 56.2 - 13.0 %   MCV 86.1 80.0 - 100.0 fL   MCH 28.4 26.0 - 34.0 pg   MCHC 33.0 30.0 - 36.0 g/dL   RDW 86.5 78.4 - 69.6 %   Platelets 257 150 - 400 K/uL   nRBC 0.0 0.0 - 0.2 %    Comment: Performed at [facility], [address], [place], [place] [address]  Differential     Status: Abnormal   Collection Time: 03/18/23 10:43 AM  Result Value Ref Range   Neutrophils Relative % 69 %   Neutro Abs 8.4 (H) 1.7 - 7.7 K/uL   Lymphocytes Relative 21 %   Lymphs Abs 2.5 0.7 - 4.0 K/uL   Monocytes Relative 8 %   Monocytes Absolute 1.0 0.1 - 1.0 K/uL   Eosinophils Relative 1 %   Eosinophils Absolute 0.1 0.0 - 0.5 K/uL   Basophils Relative 1 %   Basophils Absolute 0.1 0.0 - 0.1 K/uL   Immature Granulocytes 0 %   Abs Immature Granulocytes 0.05 0.00 - 0.07 K/uL    Comment: Performed at [facility], [address], [place], [place] [address]  Comprehensive metabolic panel     Status: Abnormal   Collection Time: 03/18/23 10:43 AM  Result Value Ref Range   Sodium 135 135 - 145 mmol/L   Potassium 3.8 3.5 - 5.1 mmol/L   Chloride 101 98 - 111 mmol/L   CO2 25 22 - 32 mmol/L   Glucose, Bld 324 (H) 70 - 99 mg/dL    Comment: Glucose reference range applies only to samples taken after fasting for at least 8 hours.   BUN 19 8 - 23 mg/dL   Creatinine, Ser 4.01 (H) 0.44 - 1.00 mg/dL   Calcium 8.8 (L) 8.9 - 10.3 mg/dL   Total Protein 6.9 6.5 - 8.1 g/dL    Albumin 3.3 (L) 3.5 - 5.0 g/dL   AST 19 15 - 41 U/L   ALT 19 0 - 44 U/L   Alkaline Phosphatase 81 38 - 126 U/L   Total Bilirubin 0.5 0.3 -  1.2 mg/dL   GFR, Estimated 54 (L) >60 mL/min    Comment: (NOTE) Calculated using the CKD-EPI Creatinine Equation (2021)    Anion gap 9 5 - 15    Comment: Performed at [facility], [address], [place], [place] [address]  Ethanol     Status: None   Collection Time: 03/18/23 10:43 AM  Result Value Ref Range   Alcohol, Ethyl (B) <10 <10 mg/dL    Comment: (NOTE) Lowest detectable limit for serum alcohol is 10 mg/dL.  For medical purposes only. Performed at [facility], [address], [place], [place] [address]   I-stat chem 8, ED     Status: Abnormal   Collection Time: 03/18/23 10:49 AM  Result Value Ref Range   Sodium 138 135 - 145 mmol/L   Potassium 4.0 3.5 - 5.1 mmol/L   Chloride 102 98 - 111 mmol/L   BUN 22 8 - 23 mg/dL   Creatinine, Ser 9.62 (H) 0.44 - 1.00 mg/dL   Glucose, Bld 952 (H) 70 - 99 mg/dL    Comment: Glucose reference range applies only to samples taken after fasting for at least 8 hours.   Calcium, Ion 1.14 (L) 1.15 - 1.40 mmol/L   TCO2 28 22 - 32 mmol/L   Hemoglobin 15.0 12.0 - 15.0 g/dL   HCT 84.1 32.4 - 40.1 %  Hemoglobin A1c     Status: Abnormal   Collection Time: 03/18/23  1:48 PM  Result Value Ref Range   Hgb A1c MFr Bld 12.3 (H) 4.8 - 5.6 %    Comment: (NOTE) Pre diabetes:          5.7%-6.4%  Diabetes:              >6.4%  Glycemic control for   <7.0% adults with diabetes    Mean Plasma Glucose 306.31 mg/dL    Comment: Performed at [facility], [address] [place], [place] [address]  Rapid urine drug screen (hospital performed)     Status: None   Collection Time: 03/18/23  2:35 PM  Result Value Ref Range   Opiates NONE DETECTED NONE DETECTED   Cocaine NONE DETECTED NONE DETECTED   Benzodiazepines NONE DETECTED NONE DETECTED   Amphetamines NONE DETECTED NONE DETECTED    Tetrahydrocannabinol NONE DETECTED NONE DETECTED   Barbiturates NONE DETECTED NONE DETECTED    Comment: (NOTE) DRUG SCREEN FOR MEDICAL PURPOSES ONLY.  IF CONFIRMATION IS NEEDED FOR ANY PURPOSE, NOTIFY LAB WITHIN 5 DAYS.  LOWEST DETECTABLE LIMITS FOR URINE DRUG SCREEN Drug Class                     Cutoff (ng/mL) Amphetamine and metabolites    1000 Barbiturate and metabolites    200 Benzodiazepine                 200 Opiates and metabolites        300 Cocaine and metabolites        300 THC                            50 Performed at [facility], [address], [place], [place] [address]   CBG monitoring, ED     Status: Abnormal   Collection Time: 03/18/23  4:35 PM  Result Value Ref Range   Glucose-Capillary 302 (H) 70 - 99 mg/dL    Comment: Glucose reference range  applies only to samples taken after fasting for at least 8 hours.   MR BRAIN WO CONTRAST  Result Date: 03/18/2023 CLINICAL DATA:  Neuro deficit, acute, stroke suspected. EXAM: MRI HEAD WITHOUT CONTRAST TECHNIQUE: Multiplanar, multiecho pulse sequences of the brain and surrounding structures were obtained without intravenous contrast. COMPARISON:  CT studies same day.  MRI 01/20/2020. FINDINGS: Brain: Diffusion imaging shows numerous small acute infarctions affecting the left hemisphere in the region of the external capsule, deep insula and left parietal lobe. There are 2 punctate acute infarctions in the right hemisphere, 1 at the frontoparietal junction in the other in the mesial temporal lobe. This distribution suggests embolic disease from the heart or ascending aorta. No large confluent infarction. No sign of mass effect or acute hemorrhage. Elsewhere, chronic small-vessel ischemic changes are seen affecting the cerebellum and cerebral hemispheric white matter. There is old infarction in the left posterior frontal lobe and left parietal lobe. Vascular: Major vessels at the base of the brain show flow. Skull and upper  cervical spine: Negative Sinuses/Orbits: Clear/normal Other: None IMPRESSION: 1. Numerous small acute infarctions affecting the left hemisphere in the region of the external capsule, deep insula and left parietal lobe. 2 punctate acute infarctions in the right hemisphere, 1 at the frontoparietal junction in the other in the mesial temporal lobe. This distribution suggests embolic disease from the heart or ascending aorta. No large confluent infarction. No sign of mass effect or acute hemorrhage. 2. Chronic small-vessel ischemic changes elsewhere affecting the cerebellum and cerebral hemispheric white matter. Old infarction in the left posterior frontal lobe and left parietal lobe. Electronically Signed   By: Paulina Fusi M.D.   On: 03/18/2023 16:27   DG CHEST PORT 1 VIEW  Result Date: 03/18/2023 CLINICAL DATA:  Right rib contusion EXAM: PORTABLE CHEST 1 VIEW COMPARISON:  Chest radiograph dated 01/16/2017 FINDINGS: Normal lung volumes. No focal consolidations. No pleural effusion or pneumothorax. Enlarged cardiomediastinal silhouette. No radiographic finding of acute displaced fracture. IMPRESSION: 1. No radiographic finding of acute displaced fracture. 2. Cardiomegaly. Electronically Signed   By: Agustin Cree M.D.   On: 03/18/2023 14:50   CT ANGIO HEAD NECK W WO CM W PERF (CODE STROKE)  Result Date: 03/18/2023 CLINICAL DATA:  Neuro deficit, acute, stroke suspected. Slurred speech. Left facial droop. EXAM: CT ANGIOGRAPHY HEAD AND NECK CT PERFUSION BRAIN TECHNIQUE: Multidetector CT imaging of the head and neck was performed using the standard protocol during bolus administration of intravenous contrast. Multiplanar CT image reconstructions and MIPs were obtained to evaluate the vascular anatomy. Carotid stenosis measurements (when applicable) are obtained utilizing NASCET criteria, using the distal internal carotid diameter as the denominator. Multiphase CT imaging of the brain was performed following IV bolus  contrast injection. Subsequent parametric perfusion maps were calculated using RAPID software. RADIATION DOSE REDUCTION: This exam was performed according to the departmental dose-optimization program which includes automated exposure control, adjustment of the mA and/or kV according to patient size and/or use of iterative reconstruction technique. CONTRAST:  OMNIPAQUE IOHEXOL 350 MG/ML SOLN COMPARISON:  Head CT immediately prior. FINDINGS: CTA NECK FINDINGS Aortic arch: Aortic atherosclerosis. Branching pattern is normal without origin stenosis. Right carotid system: Common carotid artery widely patent to the bifurcation. Soft and calcified plaque at the distal common carotid artery but no stenosis. Carotid bifurcation and ICA bulb widely patent. Cervical ICA normal beyond that. Left carotid system: Common carotid artery shows some scattered plaque but is widely patent to the bifurcation. Soft and calcified plaque  at the distal common carotid artery and carotid bifurcation but no stenosis. Cervical ICA widely patent to the skull base. Vertebral arteries: Both vertebral artery origins are patent. Both vertebral arteries are patent through the cervical region to the foramen magnum. Skeleton: Mild cervical spondylosis. Other neck: Enlarged and heterogeneous thyroid gland with multiple nodules, measuring up to 4 cm in size. Non emergent thyroid ultrasound recommended for further evaluation. No regional lymphadenopathy. Enhancing mass of the left parotid gland measuring 1 cm in size. This is larger than was seen on a MRI study of February 2020. Parotid ultrasound recommended for further evaluation. Upper chest: Mild emphysema.  No active process. Review of the MIP images confirms the above findings CTA HEAD FINDINGS Anterior circulation: Both internal carotid arteries are patent through the skull base and siphon regions. There is ordinary siphon atherosclerotic calcification but no stenosis greater than 30%  suspected. On the right, the anterior and middle cerebral arteries are patent without flow limiting stenosis. On the left, there is atherosclerotic change in the siphon but without stenosis greater than 30%. The anterior and middle cerebral vessels are patent. Mild chronic narrowing of the M1 segment without acute large vessel occlusion in that territory. Posterior circulation: Both vertebral arteries are patent through the foramen magnum to the basilar artery. The superior cerebellar and posterior cerebral arteries remain patent. There is pronounced atherosclerotic irregularity in the PCA branches, more extensive on the right than the left. Venous sinuses: Patent and normal. Anatomic variants: None other significant. Review of the MIP images confirms the above findings CT Brain Perfusion Findings: ASPECTS: 10, allowing for the old infarctions on the left. CBF (<30%) Volume: 0mL Perfusion (Tmax>6.0s) volume: 0mL Mismatch Volume: 0mL Infarction Location:None IMPRESSION: 1. No acute large vessel occlusion. 2. Atherosclerotic disease at both carotid bifurcations but without stenosis. 3. Atherosclerotic disease in both carotid siphon regions but without stenosis greater than 30%. 4. Chronic atherosclerotic narrowing of the left M1 segment without acute occlusion. 5. Pronounced atherosclerotic irregularity in the PCA branches, more extensive on the right than the left. 6. No acute perfusion abnormality. 7. Enlarged and heterogeneous thyroid gland with multiple nodules, measuring up to 4 cm in size. Non emergent thyroid ultrasound recommended for further evaluation. 8. Enhancing mass of the left parotid gland measuring 1 cm in size. This is larger than was seen on a MRI study of February 2020. Parotid ultrasound recommended for further evaluation. Aortic Atherosclerosis (ICD10-I70.0) and Emphysema (ICD10-J43.9). Electronically Signed   By: Paulina Fusi M.D.   On: 03/18/2023 11:16   CT HEAD CODE STROKE WO  CONTRAST  Result Date: 03/18/2023 CLINICAL DATA:  Code stroke. Neuro deficit, acute, stroke suspected. Slurred speech. Left-sided facial droop. EXAM: CT HEAD WITHOUT CONTRAST TECHNIQUE: Contiguous axial images were obtained from the base of the skull through the vertex without intravenous contrast. RADIATION DOSE REDUCTION: This exam was performed according to the departmental dose-optimization program which includes automated exposure control, adjustment of the mA and/or kV according to patient size and/or use of iterative reconstruction technique. COMPARISON:  07/17/2021 CT.  01/20/2020 MRI. FINDINGS: Brain: No focal abnormality seen affecting the brainstem or cerebellum. Right cerebral hemisphere shows chronic small-vessel ischemic changes, most pronounced in the forceps major region. Left cerebral hemisphere shows chronic small-vessel ischemic changes of the white matter and old cortical/subcortical infarction in the left posterior frontal lobe and left parietal lobe. No sign of acute infarction by CT. No mass, hemorrhage, hydrocephalus or extra-axial collection. Vascular: There is atherosclerotic calcification of the major  vessels at the base of the brain. Skull: Negative Sinuses/Orbits: Clear/normal Other: None ASPECTS (Alberta Stroke Program Early CT Score) - Ganglionic level infarction (caudate, lentiform nuclei, internal capsule, insula, M1-M3 cortex): 7, allowing for the old infarctions. - Supraganglionic infarction (M4-M6 cortex): 3, allowing for the old infarctions. Total score (0-10 with 10 being normal): 10, allowing for the old infarctions. IMPRESSION: 1. No acute finding by CT. Chronic small-vessel ischemic changes of the cerebral hemispheric white matter. 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 2. Aspects is 10, allowing for the old infarctions. These results were communicated to Dr. Roda Shutters at 11:03 am on 03/18/2023 by text page via the [facility]  messaging system. Electronically Signed   By: Paulina Fusi M.D.   On: 03/18/2023 11:04    Pending Labs Unresulted Labs (From admission, onward)     Start     Ordered   03/19/23 0500  Lipid panel  (Labs)  Tomorrow morning,   R       Comments: Fasting    03/18/23 1348   03/18/23 1432  HIV Antibody (routine testing w rflx)  (HIV Antibody (Routine testing w reflex) panel)  Once,   R        03/18/23 1435            Vitals/Pain Today's Vitals   03/18/23 1215 03/18/23 1245 03/18/23 1315 03/18/23 1407  BP: (!) 154/84 (!) 166/80 (!) 174/95 (!) 142/78  Pulse: 75 73 82 69  Resp: 15 19 19 17   Temp:    97.8 F (36.6 C)  TempSrc:    Oral  SpO2: 98% 100% 99% 97%  Weight:      Height:      PainSc:        Isolation Precautions No active isolations  Medications Medications   stroke: early stages of recovery book (has no administration in time range)  aspirin suppository 300 mg ( Rectal See Alternative 03/18/23 1412)    Or  aspirin EC tablet 325 mg (325 mg Oral Given 03/18/23 1412)  enoxaparin (LOVENOX) injection 40 mg (40 mg Subcutaneous Given 03/18/23 1524)  acetaminophen (TYLENOL) tablet 650 mg (has no administration in time range)    Or  acetaminophen (TYLENOL) 160 MG/5ML solution 650 mg (has no administration in time range)    Or  acetaminophen (TYLENOL) suppository 650 mg (has no administration in time range)  senna-docusate (Senokot-S) tablet 1 tablet (has no administration in time range)  insulin aspart (novoLOG) injection 0-15 Units (11 Units Subcutaneous Given 03/18/23 1806)  LORazepam (ATIVAN) tablet 1 mg (has no administration in time range)  insulin aspart (novoLOG) injection 0-5 Units (has no administration in time range)  ondansetron (ZOFRAN) injection 4 mg (has no administration in time range)  atorvastatin (LIPITOR) tablet 40 mg (40 mg Oral Given 03/18/23 1805)  gabapentin (NEURONTIN) capsule 300 mg (has no administration in time range)  ipratropium-albuterol (DUONEB)  0.5-2.5 (3) MG/3ML nebulizer solution 3 mL (has no administration in time range)  insulin glargine-yfgn (SEMGLEE) injection 15 Units (has no administration in time range)  DULoxetine (CYMBALTA) DR capsule 20 mg (has no administration in time range)  iohexol (OMNIPAQUE) 350 MG/ML injection 100 mL (100 mLs Intravenous Contrast Given 03/18/23 1057)    Mobility walks     Focused Assessments Neuro Assessment Handoff:  Swallow screen pass? Yes    NIH Stroke Scale  Dizziness Present: No Headache Present: No Interval: Initial Level of Consciousness (1a.)   : Alert, keenly responsive  LOC Questions (1b. )   : Answers both questions correctly LOC Commands (1c. )   : Performs both tasks correctly Best Gaze (2. )  : Normal Visual (3. )  : No visual loss Facial Palsy (4. )    : Minor paralysis Motor Arm, Left (5a. )   : No drift Motor Arm, Right (5b. ) : No drift Motor Leg, Left (6a. )  : No drift Motor Leg, Right (6b. ) : No drift Limb Ataxia (7. ): Absent Sensory (8. )  : Normal, no sensory loss Best Language (9. )  : Mild-to-moderate aphasia Dysarthria (10. ): Normal Extinction/Inattention (11.)   : No Abnormality Complete NIHSS TOTAL: 2 Last date known well: 03/18/23 Last time known well: 1230 Neuro Assessment:   Neuro Checks:   Initial (03/18/23 1100)  Has TPA been given? No If patient is a Neuro Trauma and patient is going to OR before floor call report to 4N Charge nurse: [phone] or [phone]   R Recommendations: See Admitting Provider Note  Report given to:   Additional Notes:

## 2023-03-18 NOTE — H&P (Addendum)
History and Physical    Patient: Teresa Kerr:[id number] DOB: 06-Dec-1950 DOA: 03/18/2023 DOS: the patient was seen and examined on 03/18/2023 PCP: Fleet Contras, MD  Patient coming from: Home - lives alone; NOK: Daughter, Dionne Ano, [phone]   Chief Complaint: Stroke-like symptoms  HPI: Teresa Kerr is a 72 y.o. female with medical history significant of DM, HTN, and prior CVAs presenting with stroke-like symptoms.  She isn't sure what happened.  Having aphasia.  She felt a little tired yesterday.  She is noticing speech difficulty yesterday and today.  She doesn't think she had n/w/t of arms/legs.  She is having significant word findings difficulty.    I spoke with her daughter - she does not usually have difficulty with her speech.  She has an aide and she fell and her speech was very slurred.  She has known brain aneurysms.  Her R face does not usually droop.  Her brother reported that the "bathroom is in major disarray" - looks like she took a hard fall.  She has never said she would not want resuscitation.      ER Course:  Needs stroke evaluation.  Code stroke, out of the window.  MRI pending.     Review of Systems: As mentioned in the history of present illness. All other systems reviewed and are negative. Past Medical History:  Diagnosis Date   Asthma    Diabetes mellitus    Gout    Hypertension    Neuromuscular disorder (HCC)    right sides weakness arm and leg, steroid injections to right shoulder-Dr. Beane(last 8'16)   Stroke (HCC) 2002, 1999   pt has right sided weakness at times, gets ESI when weakness returns   Umbilical hernia    Uterine fibroid    Past Surgical History:  Procedure Laterality Date   ANGIOPLASTY     x 2 times-cerebral arterial stents- [facility]   CYSTOSCOPY WITH RETROGRADE PYELOGRAM, URETEROSCOPY AND STENT PLACEMENT Left 09/01/2015   Procedure: CYSTOSCOPY WITH LEFT RETROGRADE, LEFT URETEROSCOPY AND STENT PLACEMENT;   Surgeon: Bjorn Pippin, MD;  Location: [facility];  Service: Urology;  Laterality: Left;   HAND SURGERY     HOLMIUM LASER APPLICATION Left 09/01/2015   Procedure: WITH HOLMIUM LASER ;  Surgeon: Bjorn Pippin, MD;  Location: [facility];  Service: Urology;  Laterality: Left;   IR ANGIO INTRA EXTRACRAN SEL COM CAROTID INNOMINATE BILAT MOD SED  03/04/2018   IR ANGIO VERTEBRAL SEL SUBCLAVIAN INNOMINATE UNI R MOD SED  03/04/2018   IR ANGIO VERTEBRAL SEL VERTEBRAL UNI L MOD SED  03/04/2018   TOE SURGERY     Social History:  reports that she quit smoking about 25 years ago. She has a 51.00 pack-year smoking history. She has never used smokeless tobacco. She reports that she does not drink alcohol and does not use drugs.  Allergies  Allergen Reactions   Hydrocodone-Acetaminophen Itching   Latex Itching, Rash and Other (See Comments)    RE: "Powdered gloves"     Family History  Problem Relation Age of Onset   Cancer Mother        breast/ had mastecomy   Cancer Father        colon    Prior to Admission medications   Medication Sig Start Date End Date Taking? Authorizing Provider  acetaminophen (TYLENOL) 325 MG tablet Take 325-650 mg by mouth every 6 (six) hours as needed for mild pain, moderate pain, fever or headache.    [provider]  allopurinol (ZYLOPRIM) 100 MG tablet Take 100 mg by mouth daily. 01/04/17   [provider]  amLODipine (NORVASC) 10 MG tablet Take 10 mg by mouth daily.      [provider]  atorvastatin (LIPITOR) 10 MG tablet Take 10 mg by mouth daily at 6 PM.  01/25/15   [provider]  azithromycin (ZITHROMAX) 250 MG tablet azithromycin 250 mg tablet    [provider]  cloNIDine (CATAPRES) 0.1 MG tablet Take 0.1 mg by mouth 2 (two) times daily. 08/22/15   [provider]  clopidogrel (PLAVIX) 75 MG tablet Take 75 mg by mouth daily.  05/01/12   [provider]  colchicine 0.6 MG tablet Take 0.6-1.2 mg by mouth See admin  instructions. 1.2 mg with flares; take 0.6 mg if still present after one hour    [provider]  cyclobenzaprine (FLEXERIL) 5 MG tablet cyclobenzaprine 5 mg tablet  TAKE 1 TABLET BY MOUTH 2 TIMES DAILY    [provider]  DEXILANT 30 MG capsule Take 30 mg by mouth daily. 11/26/16   [provider]  diazepam (VALIUM) 5 MG tablet diazepam 5 mg tablet  TAKE 1 OR 2 TABLETS BY MOUTH 1 HOUR PRIOR TO PROCEDURE    [provider]  Fluticasone-Salmeterol (ADVAIR) 250-50 MCG/DOSE AEPB Inhale 2 puffs into the lungs daily as needed (for flares).     [provider]  furosemide (LASIX) 20 MG tablet Take 20 mg by mouth daily.     [provider]  gabapentin (NEURONTIN) 300 MG capsule Take 300 mg by mouth 2 (two) times daily.    [provider]  ipratropium-albuterol (DUONEB) 0.5-2.5 (3) MG/3ML SOLN Take 3 mLs by nebulization every 4 (four) hours as needed. 01/18/17   Pearson Grippe, MD  JANUVIA 100 MG tablet Take 100 mg by mouth daily. 05/19/19   [provider]  LEVEMIR FLEXTOUCH 100 UNIT/ML Pen Inject 50 Units into the skin at bedtime. Patient taking differently: Inject into the skin at bedtime. Per pt: 54u at night, and 8u q AM 01/18/17   Pearson Grippe, MD  lisinopril (PRINIVIL,ZESTRIL) 40 MG tablet Take 40 mg by mouth daily.  01/05/15   [provider]  LORazepam (ATIVAN) 1 MG tablet 1 TABLET TO BE TAKEN AT OFFICE VISIT ON 9/23. MUST HAVE SOMEONE TO DRIVE FOLLOWING APPOINTMENT 06/11/19   [provider]  losartan (COZAAR) 50 MG tablet Take 50 mg by mouth daily. 06/02/19   [provider]  mupirocin nasal ointment (BACTROBAN) 2 % Apply in each nostril daily 01/31/18   Frederica Kuster, MD  ondansetron (ZOFRAN) 4 MG tablet Take 1 tablet (4 mg total) by mouth every 8 (eight) hours as needed for nausea or vomiting. 08/09/15   Gavin Pound, MD  PROAIR RESPICLICK 108 (90 BASE) MCG/ACT AEPB Inhale 2 puffs into the lungs every 6  (six) hours as needed (shortness of breath).  10/25/14   [provider]  ranitidine (ZANTAC) 150 MG tablet ranitidine 150 mg tablet    [provider]  Rivaroxaban 15 & 20 MG TBPK Take as directed on package: Start with one 15mg  tablet by mouth twice a day with food. On Day 22, switch to one 20mg  tablet once a day with food. 12/05/18   Harlene Salts A, PA-C  terconazole (TERAZOL 7) 0.4 % vaginal cream terconazole 0.4 % vaginal cream  Insert 1 application vaginally at bedtime for 3 nights    [provider]  tiZANidine (ZANAFLEX) 2 MG tablet Take 2 mg by mouth 2 (two) times daily. 01/04/17   [provider]  traMADol (ULTRAM) 50 MG tablet Take 1 tablet (50 mg total) by mouth every 6 (six) hours as needed. 09/21/17   Evon Slack, PA-C    Physical Exam: Vitals:   03/18/23 1215 03/18/23 1245 03/18/23 1315 03/18/23 1407  BP: (!) 154/84 (!) 166/80 (!) 174/95 (!) 142/78  Pulse: 75 73 82 69  Resp: 15 19 19 17   Temp:    97.8 F (36.6 C)  TempSrc:    Oral  SpO2: 98% 100% 99% 97%  Weight:      Height:       General:  Appears calm and is in NAD, marked aphasia Eyes:  EOMI, normal lids, iris ENT:  grossly normal hearing, lips & tongue, mmm Neck:  no LAD, masses or thyromegaly Cardiovascular:  RRR, no m/r/g. No LE edema.  Respiratory:   CTA bilaterally with no wheezes/rales/rhonchi.  Normal respiratory effort. Abdomen:  soft, NT, ND Skin:  no rash or induration seen on limited exam Musculoskeletal:  grossly normal tone BUE/BLE, good ROM, no bony abnormality Psychiatric:  blunted mood and affect, speech dysarthric but appropriate with some word-finding difficulty, AOx3 Neurologic:  R facial droop, moves all extremities in coordinated fashion   Radiological Exams on Admission: Independently reviewed - see discussion in A/P where applicable  MR BRAIN WO CONTRAST  Result Date: 03/18/2023 CLINICAL DATA:  Neuro deficit, acute, stroke suspected. EXAM: MRI  HEAD WITHOUT CONTRAST TECHNIQUE: Multiplanar, multiecho pulse sequences of the brain and surrounding structures were obtained without intravenous contrast. COMPARISON:  CT studies same day.  MRI 01/20/2020. FINDINGS: Brain: Diffusion imaging shows numerous small acute infarctions affecting the left hemisphere in the region of the external capsule, deep insula and left parietal lobe. There are 2 punctate acute infarctions in the right hemisphere, 1 at the frontoparietal junction in the other in the mesial temporal lobe. This distribution suggests embolic disease from the heart or ascending aorta. No large confluent infarction. No sign of mass effect or acute hemorrhage. Elsewhere, chronic small-vessel ischemic changes are seen affecting the cerebellum and cerebral hemispheric white matter. There is old infarction in the left posterior frontal lobe and left parietal lobe. Vascular: Major vessels at the base of the brain show flow. Skull and upper cervical spine: Negative Sinuses/Orbits: Clear/normal Other: None IMPRESSION: 1. Numerous small acute infarctions affecting the left hemisphere in the region of the external capsule, deep insula and left parietal lobe. 2 punctate acute infarctions in the right hemisphere, 1 at the frontoparietal junction in the other in the mesial temporal lobe. This distribution suggests embolic disease from the heart or ascending aorta. No large confluent infarction. No sign of mass effect or acute hemorrhage. 2. Chronic small-vessel ischemic changes elsewhere affecting the cerebellum and cerebral hemispheric white matter. Old infarction in the left posterior frontal lobe and left parietal lobe. Electronically Signed   By: Paulina Fusi M.D.   On: 03/18/2023 16:27   DG CHEST PORT 1 VIEW  Result Date: 03/18/2023 CLINICAL DATA:  Right rib contusion EXAM: PORTABLE CHEST 1 VIEW COMPARISON:  Chest radiograph dated 01/16/2017 FINDINGS: Normal lung volumes. No focal consolidations. No pleural  effusion or pneumothorax. Enlarged cardiomediastinal silhouette. No radiographic finding of acute displaced fracture. IMPRESSION: 1. No radiographic finding of acute displaced fracture. 2. Cardiomegaly. Electronically Signed   By: Agustin Cree M.D.   On: 03/18/2023 14:50   CT ANGIO HEAD NECK W  WO CM W PERF (CODE STROKE)  Result Date: 03/18/2023 CLINICAL DATA:  Neuro deficit, acute, stroke suspected. Slurred speech. Left facial droop. EXAM: CT ANGIOGRAPHY HEAD AND NECK CT PERFUSION BRAIN TECHNIQUE: Multidetector CT imaging of the head and neck was performed using the standard protocol during bolus administration of intravenous contrast. Multiplanar CT image reconstructions and MIPs were obtained to evaluate the vascular anatomy. Carotid stenosis measurements (when applicable) are obtained utilizing NASCET criteria, using the distal internal carotid diameter as the denominator. Multiphase CT imaging of the brain was performed following IV bolus contrast injection. Subsequent parametric perfusion maps were calculated using RAPID software. RADIATION DOSE REDUCTION: This exam was performed according to the departmental dose-optimization program which includes automated exposure control, adjustment of the mA and/or kV according to patient size and/or use of iterative reconstruction technique. CONTRAST:  OMNIPAQUE IOHEXOL 350 MG/ML SOLN COMPARISON:  Head CT immediately prior. FINDINGS: CTA NECK FINDINGS Aortic arch: Aortic atherosclerosis. Branching pattern is normal without origin stenosis. Right carotid system: Common carotid artery widely patent to the bifurcation. Soft and calcified plaque at the distal common carotid artery but no stenosis. Carotid bifurcation and ICA bulb widely patent. Cervical ICA normal beyond that. Left carotid system: Common carotid artery shows some scattered plaque but is widely patent to the bifurcation. Soft and calcified plaque at the distal common carotid artery and carotid  bifurcation but no stenosis. Cervical ICA widely patent to the skull base. Vertebral arteries: Both vertebral artery origins are patent. Both vertebral arteries are patent through the cervical region to the foramen magnum. Skeleton: Mild cervical spondylosis. Other neck: Enlarged and heterogeneous thyroid gland with multiple nodules, measuring up to 4 cm in size. Non emergent thyroid ultrasound recommended for further evaluation. No regional lymphadenopathy. Enhancing mass of the left parotid gland measuring 1 cm in size. This is larger than was seen on a MRI study of February 2020. Parotid ultrasound recommended for further evaluation. Upper chest: Mild emphysema.  No active process. Review of the MIP images confirms the above findings CTA HEAD FINDINGS Anterior circulation: Both internal carotid arteries are patent through the skull base and siphon regions. There is ordinary siphon atherosclerotic calcification but no stenosis greater than 30% suspected. On the right, the anterior and middle cerebral arteries are patent without flow limiting stenosis. On the left, there is atherosclerotic change in the siphon but without stenosis greater than 30%. The anterior and middle cerebral vessels are patent. Mild chronic narrowing of the M1 segment without acute large vessel occlusion in that territory. Posterior circulation: Both vertebral arteries are patent through the foramen magnum to the basilar artery. The superior cerebellar and posterior cerebral arteries remain patent. There is pronounced atherosclerotic irregularity in the PCA branches, more extensive on the right than the left. Venous sinuses: Patent and normal. Anatomic variants: None other significant. Review of the MIP images confirms the above findings CT Brain Perfusion Findings: ASPECTS: 10, allowing for the old infarctions on the left. CBF (<30%) Volume: 0mL Perfusion (Tmax>6.0s) volume: 0mL Mismatch Volume: 0mL Infarction Location:None IMPRESSION: 1. No  acute large vessel occlusion. 2. Atherosclerotic disease at both carotid bifurcations but without stenosis. 3. Atherosclerotic disease in both carotid siphon regions but without stenosis greater than 30%. 4. Chronic atherosclerotic narrowing of the left M1 segment without acute occlusion. 5. Pronounced atherosclerotic irregularity in the PCA branches, more extensive on the right than the left. 6. No acute perfusion abnormality. 7. Enlarged and heterogeneous thyroid gland with multiple nodules, measuring up to 4 cm  in size. Non emergent thyroid ultrasound recommended for further evaluation. 8. Enhancing mass of the left parotid gland measuring 1 cm in size. This is larger than was seen on a MRI study of February 2020. Parotid ultrasound recommended for further evaluation. Aortic Atherosclerosis (ICD10-I70.0) and Emphysema (ICD10-J43.9). Electronically Signed   By: Paulina Fusi M.D.   On: 03/18/2023 11:16   CT HEAD CODE STROKE WO CONTRAST  Result Date: 03/18/2023 CLINICAL DATA:  Code stroke. Neuro deficit, acute, stroke suspected. Slurred speech. Left-sided facial droop. EXAM: CT HEAD WITHOUT CONTRAST TECHNIQUE: Contiguous axial images were obtained from the base of the skull through the vertex without intravenous contrast. RADIATION DOSE REDUCTION: This exam was performed according to the departmental dose-optimization program which includes automated exposure control, adjustment of the mA and/or kV according to patient size and/or use of iterative reconstruction technique. COMPARISON:  07/17/2021 CT.  01/20/2020 MRI. FINDINGS: Brain: No focal abnormality seen affecting the brainstem or cerebellum. Right cerebral hemisphere shows chronic small-vessel ischemic changes, most pronounced in the forceps major region. Left cerebral hemisphere shows chronic small-vessel ischemic changes of the white matter and old cortical/subcortical infarction in the left posterior frontal lobe and left parietal lobe. No sign of acute  infarction by CT. No mass, hemorrhage, hydrocephalus or extra-axial collection. Vascular: There is atherosclerotic calcification of the major vessels at the base of the brain. Skull: Negative Sinuses/Orbits: Clear/normal Other: None ASPECTS ([place]ta Stroke Program Early CT Score) - Ganglionic level infarction (caudate, lentiform nuclei, internal capsule, insula, M1-M3 cortex): 7, allowing for the old infarctions. - Supraganglionic infarction (M4-M6 cortex): 3, allowing for the old infarctions. Total score (0-10 with 10 being normal): 10, allowing for the old infarctions. IMPRESSION: 1. No acute finding by CT. Chronic small-vessel ischemic changes of the cerebral hemispheric white matter. 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 2. Aspects is 10, allowing for the old infarctions. These results were communicated to Dr. Roda Shutters at 11:03 am on 03/18/2023 by text page via the [facility] messaging system. Electronically Signed   By: Paulina Fusi M.D.   On: 03/18/2023 11:04    EKG: Independently reviewed.  NSR with rate 81; prolonged QTc 524; RBBB with no evidence of acute ischemia   Labs on Admission: I have personally reviewed the available labs and imaging studies at the time of the admission.  Pertinent labs:    Glucose 324 BUN 19/Creatinine 1.11/GFR 54; 15/0.8/>60 in 2021 Albumin 3.3 WBC 12 INR 1.6 ETOH <10 A1c 12.3   Assessment and Plan: Principal Problem:   Stroke-like symptoms Active Problems:   OBESITY   Hypertension   History of stroke   Diabetes mellitus with complication (HCC)   Dyslipidemia    Stroke-like symptoms -Patient with h/o CVAs presenting with R facial droop and speech difficulties -Concerning for CVA -Aspirin has been given to reduce stroke mortality and decrease morbidity -Will place in observation status for CVA evaluation -Telemetry monitoring -MRI ordered -Echo ordered -Risk stratification with FLP, A1c -Patient  will need DAPT for 21 days when ABCD2 score is at least 4 and NIH score is 3 or less, and then can transition to monotherapy with a single antiplatelet agent.   Will defer to neurology for now. -Neurology consult -PT/OT/ST/Nutrition Consults  HTN -Allow permissive HTN for now -Treat BP only if >220/120, and then with goal of 15% reduction -Hold home meds (clonidine, lisinopril/losartan and plan to restart in 48-72 hours   HLD -Check FLP -Resume statin increase Lipitor to  40 mg daily   DM -A1c is 12.3 - very poor control -Hold home PO Jardiance, metformin -Continue Levemir -Will order moderate-scale SSI -DM coordinator consulted -Continue duloxetine  RLE DVT -On Xarelto - hold for now  Morbid obesity -Body mass index is 40.11 kg/m..  -Weight loss should be encouraged -Outpatient PCP/bariatric medicine f/u encouraged      *Patient initially reported desire for DNR but changed her mind upon daughter's arrival.  She is now a full code.   Advance Care Planning:   Code Status: Full Code   Consults: Neurology; PT/OT/ST; nutrition; TOC team; DM coordinator  DVT Prophylaxis: Lovenox  Family Communication: None present initially; I spoke with her daughter by telephone and then again later in person   Severity of Illness: The appropriate patient status for this patient is OBSERVATION. Observation status is judged to be reasonable and necessary in order to provide the required intensity of service to ensure the patient's safety. The patient's presenting symptoms, physical exam findings, and initial radiographic and laboratory data in the context of their medical condition is felt to place them at decreased risk for further clinical deterioration. Furthermore, it is anticipated that the patient will be medically stable for discharge from the hospital within 2 midnights of admission.   Author: Jonah Blue, MD 03/18/2023 5:49 PM  For on call review [email or web address].

## 2023-03-18 NOTE — Consult Note (Signed)
Stroke Neurology Consultation Note  Consult Requested by: Dr. Jeraldine Loots  Reason for Consult: slurry speech and right facial droop  Consult Date: 03/18/23   The history was obtained from the patient and EMS.  During history and examination, all items were able to obtain unless otherwise noted.  History of Present Illness:  Teresa Kerr is a 72 y.o. 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  Per patient and EMS, patient went to sleep around 12:30 AM at her baseline.  Between 3 AM to 4 AM patient went to bathroom and had fallen twice and hit her head.  She did not talk to anybody at the time, not sure about her speech.  8:30 in the morning she called her home health aide who found the patient had significant slurred speech.  EMS called, on arrival BP 149/88, glucose 405, found patient to have slurred speech and right facial droop.  Code stroke activated.  In ER, patient still has slurred speech and right facial droop with mild paraphasic errors, NIH score 3.  CT no acute abnormality.  CTA head and neck no LVO.  Patient will be admitted for further stroke workup.  Patient stated that she had strokes in the past and treated in this hospital, seems before electric medical record era.  Per chart, she probably had a stroke in 1999, 2001 and 2002.  Had left MCA angioplasty in 2001 and 2002 with Dr. Corliss Skains.  Last follow-up with Dr. Corliss Skains in 2021 had MRI showed chronic left MCA territory infarct and MRA head showed at least moderate stenosis left cavernous ICA, progressed from before.  Severe stenosis right MCA bifurcation, stable.  Moderate diffuse stenosis proximal left P2, severe left P2 and distal right P3 stenosis.  She stated that she takes Xarelto 20 mg daily at home, last dose yesterday morning.  She did not took today though.  She stated compliance with  medication.  LSN: 12:30 in the morning tPA Given: No: Also window, on Xarelto IR: No, no LVO mRS = 2  Past Medical History:  Diagnosis Date   Asthma    Diabetes mellitus    Gout    Hypertension    Neuromuscular disorder (HCC)    right sides weakness arm and leg, steroid injections to right shoulder-Dr. Beane(last 8'16)   Stroke (HCC) 2002, 1999   pt has right sided weakness at times, gets ESI when weakness returns   Umbilical hernia    Uterine fibroid     Past Surgical History:  Procedure Laterality Date   ANGIOPLASTY     x 2 times-cerebral arterial stents- [facility]   CYSTOSCOPY WITH RETROGRADE PYELOGRAM, URETEROSCOPY AND STENT PLACEMENT Left 09/01/2015   Procedure: CYSTOSCOPY WITH LEFT RETROGRADE, LEFT URETEROSCOPY AND STENT PLACEMENT;  Surgeon: Bjorn Pippin, MD;  Location: [facility];  Service: Urology;  Laterality: Left;   HAND SURGERY     HOLMIUM LASER APPLICATION Left 09/01/2015   Procedure: WITH HOLMIUM LASER ;  Surgeon: Bjorn Pippin, MD;  Location: [facility];  Service: Urology;  Laterality: Left;   IR ANGIO INTRA EXTRACRAN SEL COM CAROTID INNOMINATE BILAT MOD SED  03/04/2018   IR ANGIO VERTEBRAL SEL SUBCLAVIAN INNOMINATE UNI R MOD SED  03/04/2018   IR ANGIO VERTEBRAL SEL VERTEBRAL UNI L MOD SED  03/04/2018   TOE SURGERY      Family History  Problem Relation Age of Onset  Cancer Mother        breast/ had mastecomy   Cancer Father        colon    Social History:  reports that she quit smoking about 25 years ago. She has a 51.00 pack-year smoking history. She has never used smokeless tobacco. She reports that she does not drink alcohol and does not use drugs.  Allergies:  Allergies  Allergen Reactions   Hydrocodone-Acetaminophen Itching   Latex Itching, Rash and Other (See Comments)    RE: "Powdered gloves"     No current facility-administered medications on file prior to encounter.   Current Outpatient Medications on File Prior to Encounter  Medication Sig Dispense  Refill   acetaminophen (TYLENOL) 325 MG tablet Take 325-650 mg by mouth every 6 (six) hours as needed for mild pain, moderate pain, fever or headache.     allopurinol (ZYLOPRIM) 100 MG tablet Take 100 mg by mouth daily.     amLODipine (NORVASC) 10 MG tablet Take 10 mg by mouth daily.       atorvastatin (LIPITOR) 10 MG tablet Take 10 mg by mouth daily at 6 PM.   3   azithromycin (ZITHROMAX) 250 MG tablet azithromycin 250 mg tablet     cloNIDine (CATAPRES) 0.1 MG tablet Take 0.1 mg by mouth 2 (two) times daily.     clopidogrel (PLAVIX) 75 MG tablet Take 75 mg by mouth daily.      colchicine 0.6 MG tablet Take 0.6-1.2 mg by mouth See admin instructions. 1.2 mg with flares; take 0.6 mg if still present after one hour     cyclobenzaprine (FLEXERIL) 5 MG tablet cyclobenzaprine 5 mg tablet  TAKE 1 TABLET BY MOUTH 2 TIMES DAILY     DEXILANT 30 MG capsule Take 30 mg by mouth daily.     diazepam (VALIUM) 5 MG tablet diazepam 5 mg tablet  TAKE 1 OR 2 TABLETS BY MOUTH 1 HOUR PRIOR TO PROCEDURE     Fluticasone-Salmeterol (ADVAIR) 250-50 MCG/DOSE AEPB Inhale 2 puffs into the lungs daily as needed (for flares).      furosemide (LASIX) 20 MG tablet Take 20 mg by mouth daily.      gabapentin (NEURONTIN) 300 MG capsule Take 300 mg by mouth 2 (two) times daily.     ipratropium-albuterol (DUONEB) 0.5-2.5 (3) MG/3ML SOLN Take 3 mLs by nebulization every 4 (four) hours as needed. 360 mL 0   JANUVIA 100 MG tablet Take 100 mg by mouth daily.     LEVEMIR FLEXTOUCH 100 UNIT/ML Pen Inject 50 Units into the skin at bedtime. (Patient taking differently: Inject into the skin at bedtime. Per pt: 54u at night, and 8u q AM) 15 mL 1   lisinopril (PRINIVIL,ZESTRIL) 40 MG tablet Take 40 mg by mouth daily.   3   LORazepam (ATIVAN) 1 MG tablet 1 TABLET TO BE TAKEN AT OFFICE VISIT ON 9/23. MUST HAVE SOMEONE TO DRIVE FOLLOWING APPOINTMENT     losartan (COZAAR) 50 MG tablet Take 50 mg by mouth daily.     mupirocin nasal ointment  (BACTROBAN) 2 % Apply in each nostril daily 1 g 1   ondansetron (ZOFRAN) 4 MG tablet Take 1 tablet (4 mg total) by mouth every 8 (eight) hours as needed for nausea or vomiting. 20 tablet 0   PROAIR RESPICLICK 108 (90 BASE) MCG/ACT AEPB Inhale 2 puffs into the lungs every 6 (six) hours as needed (shortness of breath).   3   ranitidine (ZANTAC) 150 MG tablet  ranitidine 150 mg tablet     Rivaroxaban 15 & 20 MG TBPK Take as directed on package: Start with one 15mg  tablet by mouth twice a day with food. On Day 22, switch to one 20mg  tablet once a day with food. 51 each 0   terconazole (TERAZOL 7) 0.4 % vaginal cream terconazole 0.4 % vaginal cream  Insert 1 application vaginally at bedtime for 3 nights     tiZANidine (ZANAFLEX) 2 MG tablet Take 2 mg by mouth 2 (two) times daily.     traMADol (ULTRAM) 50 MG tablet Take 1 tablet (50 mg total) by mouth every 6 (six) hours as needed. 20 tablet 0    Review of Systems: A full ROS was attempted today and was able to be performed.  Systems assessed include - Constitutional, Eyes, HENT, Respiratory, Cardiovascular, Gastrointestinal, Genitourinary, Integument/breast, Hematologic/lymphatic, Musculoskeletal, Neurological, Behavioral/Psych, Endocrine, Allergic/Immunologic - with pertinent responses as per HPI.  Physical Examination: Temp:  [97.4 F (36.3 C)] 97.4 F (36.3 C) (06/24 1108) Pulse Rate:  [74-81] 75 (06/24 1215) Resp:  [15-20] 15 (06/24 1215) BP: (151-163)/(58-84) 154/84 (06/24 1215) SpO2:  [94 %-100 %] 98 % (06/24 1215) Weight:  [409 kg] 106 kg (06/24 1000)  General - well nourished, well developed, in no apparent distress.    Ophthalmologic - fundi not visualized due to noncooperation.    Cardiovascular - regular rhythm and rate  Mental Status -  Level of arousal and orientation to time, place, and person were intact. Language including expression, naming, repetition, comprehension was assessed and found intact except intermittent  paraphasic errors.  Mild dysarthria Fund of Knowledge was assessed and was intact.  Cranial Nerves II - XII - II - Vision intact OU. III, IV, VI - Extraocular movements intact. V - Facial sensation intact bilaterally. VII - Right facial droop VIII - Hearing & vestibular intact bilaterally. X - Palate elevates symmetrically.  Mild dysarthria XI - Chin turning & shoulder shrug intact bilaterally. XII - Tongue protrusion intact.  Motor Strength - The patient's strength was normal in all extremities and pronator drift was absent.   Motor Tone & Bulk - Muscle tone was assessed at the neck and appendages and was normal.  Bulk was normal and fasciculations were absent.   Reflexes - The patient's reflexes were normal in all extremities and she had no pathological reflexes.  Sensory - Light touch, temperature/pinprick were assessed and were normal.    Coordination - The patient had normal movements in the hands and feet with no ataxia or dysmetria.  Tremor was absent.  Gait and Station - deferred  NIHSS 3 for dysarthria, mild aphasia, right facial droop  Data Reviewed: CT ANGIO HEAD NECK W WO CM W PERF (CODE STROKE)  Result Date: 03/18/2023 CLINICAL DATA:  Neuro deficit, acute, stroke suspected. Slurred speech. Left facial droop. EXAM: CT ANGIOGRAPHY HEAD AND NECK CT PERFUSION BRAIN TECHNIQUE: Multidetector CT imaging of the head and neck was performed using the standard protocol during bolus administration of intravenous contrast. Multiplanar CT image reconstructions and MIPs were obtained to evaluate the vascular anatomy. Carotid stenosis measurements (when applicable) are obtained utilizing NASCET criteria, using the distal internal carotid diameter as the denominator. Multiphase CT imaging of the brain was performed following IV bolus contrast injection. Subsequent parametric perfusion maps were calculated using RAPID software. RADIATION DOSE REDUCTION: This exam was performed according to  the departmental dose-optimization program which includes automated exposure control, adjustment of the mA and/or kV according to patient size  and/or use of iterative reconstruction technique. CONTRAST:  OMNIPAQUE IOHEXOL 350 MG/ML SOLN COMPARISON:  Head CT immediately prior. FINDINGS: CTA NECK FINDINGS Aortic arch: Aortic atherosclerosis. Branching pattern is normal without origin stenosis. Right carotid system: Common carotid artery widely patent to the bifurcation. Soft and calcified plaque at the distal common carotid artery but no stenosis. Carotid bifurcation and ICA bulb widely patent. Cervical ICA normal beyond that. Left carotid system: Common carotid artery shows some scattered plaque but is widely patent to the bifurcation. Soft and calcified plaque at the distal common carotid artery and carotid bifurcation but no stenosis. Cervical ICA widely patent to the skull base. Vertebral arteries: Both vertebral artery origins are patent. Both vertebral arteries are patent through the cervical region to the foramen magnum. Skeleton: Mild cervical spondylosis. Other neck: Enlarged and heterogeneous thyroid gland with multiple nodules, measuring up to 4 cm in size. Non emergent thyroid ultrasound recommended for further evaluation. No regional lymphadenopathy. Enhancing mass of the left parotid gland measuring 1 cm in size. This is larger than was seen on a MRI study of February 2020. Parotid ultrasound recommended for further evaluation. Upper chest: Mild emphysema.  No active process. Review of the MIP images confirms the above findings CTA HEAD FINDINGS Anterior circulation: Both internal carotid arteries are patent through the skull base and siphon regions. There is ordinary siphon atherosclerotic calcification but no stenosis greater than 30% suspected. On the right, the anterior and middle cerebral arteries are patent without flow limiting stenosis. On the left, there is atherosclerotic change in the  siphon but without stenosis greater than 30%. The anterior and middle cerebral vessels are patent. Mild chronic narrowing of the M1 segment without acute large vessel occlusion in that territory. Posterior circulation: Both vertebral arteries are patent through the foramen magnum to the basilar artery. The superior cerebellar and posterior cerebral arteries remain patent. There is pronounced atherosclerotic irregularity in the PCA branches, more extensive on the right than the left. Venous sinuses: Patent and normal. Anatomic variants: None other significant. Review of the MIP images confirms the above findings CT Brain Perfusion Findings: ASPECTS: 10, allowing for the old infarctions on the left. CBF (<30%) Volume: 0mL Perfusion (Tmax>6.0s) volume: 0mL Mismatch Volume: 0mL Infarction Location:None IMPRESSION: 1. No acute large vessel occlusion. 2. Atherosclerotic disease at both carotid bifurcations but without stenosis. 3. Atherosclerotic disease in both carotid siphon regions but without stenosis greater than 30%. 4. Chronic atherosclerotic narrowing of the left M1 segment without acute occlusion. 5. Pronounced atherosclerotic irregularity in the PCA branches, more extensive on the right than the left. 6. No acute perfusion abnormality. 7. Enlarged and heterogeneous thyroid gland with multiple nodules, measuring up to 4 cm in size. Non emergent thyroid ultrasound recommended for further evaluation. 8. Enhancing mass of the left parotid gland measuring 1 cm in size. This is larger than was seen on a MRI study of February 2020. Parotid ultrasound recommended for further evaluation. Aortic Atherosclerosis (ICD10-I70.0) and Emphysema (ICD10-J43.9). Electronically Signed   By: Paulina Fusi M.D.   On: 03/18/2023 11:16   CT HEAD CODE STROKE WO CONTRAST  Result Date: 03/18/2023 CLINICAL DATA:  Code stroke. Neuro deficit, acute, stroke suspected. Slurred speech. Left-sided facial droop. EXAM: CT HEAD WITHOUT CONTRAST  TECHNIQUE: Contiguous axial images were obtained from the base of the skull through the vertex without intravenous contrast. RADIATION DOSE REDUCTION: This exam was performed according to the departmental dose-optimization program which includes automated exposure control, adjustment of the mA and/or  kV according to patient size and/or use of iterative reconstruction technique. COMPARISON:  07/17/2021 CT.  01/20/2020 MRI. FINDINGS: Brain: No focal abnormality seen affecting the brainstem or cerebellum. Right cerebral hemisphere shows chronic small-vessel ischemic changes, most pronounced in the forceps major region. Left cerebral hemisphere shows chronic small-vessel ischemic changes of the white matter and old cortical/subcortical infarction in the left posterior frontal lobe and left parietal lobe. No sign of acute infarction by CT. No mass, hemorrhage, hydrocephalus or extra-axial collection. Vascular: There is atherosclerotic calcification of the major vessels at the base of the brain. Skull: Negative Sinuses/Orbits: Clear/normal Other: None ASPECTS (Alberta Stroke Program Early CT Score) - Ganglionic level infarction (caudate, lentiform nuclei, internal capsule, insula, M1-M3 cortex): 7, allowing for the old infarctions. - Supraganglionic infarction (M4-M6 cortex): 3, allowing for the old infarctions. Total score (0-10 with 10 being normal): 10, allowing for the old infarctions. IMPRESSION: 1. No acute finding by CT. Chronic small-vessel ischemic changes of the cerebral hemispheric white matter. 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 2. Aspects is 10, allowing for the old infarctions. These results were communicated to Dr. Roda Shutters at 11:03 am on 03/18/2023 by text page via the [facility] messaging system. Electronically Signed   By: Paulina Fusi M.D.   On: 03/18/2023 11:04    Assessment: 72 y.o. 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 Corliss Skains presented to ED for slurred speech and right facial droop.  Last seen well 12:30 AM.  NIH was 3.  CT no acute abnormality.  CTA head and neck no LVO, but chronic left M1 stenosis, atherosclerosis bilateral ICA siphon and bulb, bilateral PCA stenosis right more than left.  Patient will be admitted for further stroke workup.  Plan: Recommend admission for further stroke work up  Frequent neuro checks Telemetry monitoring MRI brain  Echocardiogram  UDS, fasting lipid panel and HgbA1C PT/OT/speech consult Permissive hypertension (only treat if BP > 180/105 given on Xarelto) for 24-48 hours post stroke onset GI and DVT prophylaxis  Will add ASA 325 or PR 300 for now. Once MRI done, will decide on resume Xarelto to change to alternatives.  Stroke risk factor modification Will follow   Thank you for this consultation and allowing [place] to participate in the care of this patient.  Marvel Plan, MD PhD Stroke Neurology 03/18/2023 1:46 PM

## 2023-03-18 NOTE — ED Triage Notes (Signed)
Pt BIB [facility] from home as Code Stroke, LKN 1230 this morning, also reports falling 3x in her bathroom & does remember hitting her head, unknown LOC. Does take Eliquis & Xarelto, Hx of strokes, Pt reports calling her home health aid at 0830 & toild her she felt "off" & having trouble speaking. EMS reports upon their arrival she was A/Ox4, gibberish speech & gate was normal & strengths equal. Upon arrival to ED her CBG was 293 & had Rt sided facial droop A& slurred speech as her presenting deficits. 18g Rt AC.

## 2023-03-18 NOTE — Code Documentation (Signed)
Stroke Response Nurse Documentation Code Documentation  Teresa Kerr is a 72 y.o. female arriving to [facility]  via [facility] on 03/18/23 with past medical hx of DM, CVA, DVT, HLD, HTN. On Xarelto (rivaroxaban) daily. Code stroke was activated by EMS.   Patient from home where she was LKW at 0030 and now complaining of speech difficulty, falls. Patient from home. She got up twice overnight between 3-4am and fell in the bathroom. When her home health aid called at 0830 she noticed her speech changes. EMS called.    Stroke team at the bedside on patient arrival. Labs drawn and patient cleared for CT by Dr. Jeraldine Loots. CBG 293. Patient to CT with team. NIHSS 3, see documentation for details and code stroke times. Patient with right facial droop, Expressive aphasia , and dysarthria  on exam. The following imaging was completed:  CT Head, CTA, and CTP. Patient is not a candidate for IV Thrombolytic due to outside the window and on blood thinner. Patient is not a candidate for IR due to no acute LVO per MD.   Care Plan: Q2 NIHSS/vitals.   Bedside handoff with ED RN Corrie Dandy.    Modena Slater  Stroke Response RN [phone]

## 2023-03-19 ENCOUNTER — Observation Stay (HOSPITAL_BASED_OUTPATIENT_CLINIC_OR_DEPARTMENT_OTHER): Payer: 59

## 2023-03-19 ENCOUNTER — Other Ambulatory Visit (HOSPITAL_COMMUNITY): Payer: Self-pay

## 2023-03-19 DIAGNOSIS — I639 Cerebral infarction, unspecified: Secondary | ICD-10-CM | POA: Diagnosis not present

## 2023-03-19 DIAGNOSIS — I6389 Other cerebral infarction: Secondary | ICD-10-CM | POA: Diagnosis not present

## 2023-03-19 DIAGNOSIS — R2990 Unspecified symptoms and signs involving the nervous system: Secondary | ICD-10-CM | POA: Diagnosis not present

## 2023-03-19 DIAGNOSIS — R29818 Other symptoms and signs involving the nervous system: Secondary | ICD-10-CM | POA: Diagnosis not present

## 2023-03-19 LAB — LIPID PANEL
Cholesterol: 174 mg/dL (ref 0–200)
HDL: 33 mg/dL — ABNORMAL LOW (ref >40)
LDL Cholesterol: 102 mg/dL — ABNORMAL HIGH (ref 0–99)
Total CHOL/HDL Ratio: 5.3 RATIO
Triglycerides: 194 mg/dL — ABNORMAL HIGH (ref <150)
VLDL: 39 mg/dL (ref 0–40)

## 2023-03-19 LAB — HIV ANTIBODY (ROUTINE TESTING W REFLEX): HIV Screen 4th Generation wRfx: NONREACTIVE

## 2023-03-19 LAB — BASIC METABOLIC PANEL
Anion gap: 11 (ref 5–15)
BUN: 16 mg/dL (ref 8–23)
CO2: 23 mmol/L (ref 22–32)
Calcium: 8.8 mg/dL — ABNORMAL LOW (ref 8.9–10.3)
Chloride: 102 mmol/L (ref 98–111)
Creatinine, Ser: 0.95 mg/dL (ref 0.44–1.00)
GFR, Estimated: >60 mL/min (ref >60)
Glucose, Bld: 308 mg/dL — ABNORMAL HIGH (ref 70–99)
Potassium: 4.2 mmol/L (ref 3.5–5.1)
Sodium: 136 mmol/L (ref 135–145)

## 2023-03-19 LAB — GLUCOSE, CAPILLARY
Glucose-Capillary: 206 mg/dL — ABNORMAL HIGH (ref 70–99)
Glucose-Capillary: 315 mg/dL — ABNORMAL HIGH (ref 70–99)

## 2023-03-19 LAB — ECHOCARDIOGRAM COMPLETE
Area-P 1/2: 2.56 cm2
Calc EF: 79.9 %
Height: 64 in
MV M vel: 2.75 m/s
MV Peak grad: 30.3 mmHg
MV VTI: 1.79 cm2
S' Lateral: 2.2 cm
Single Plane A2C EF: 68.4 %
Single Plane A4C EF: 85.7 %
Weight: 3559.11 oz

## 2023-03-19 MED ORDER — APIXABAN 5 MG PO TABS: 5.0000 mg | ORAL_TABLET | Freq: Two times a day (BID) | ORAL | Status: DC

## 2023-03-19 MED ORDER — LINAGLIPTIN 5 MG PO TABS
5.0000 mg | ORAL_TABLET | Freq: Every day | ORAL | Status: DC
  Administered 2023-03-19: 5 mg via ORAL
  Filled 2023-03-19: qty 1

## 2023-03-19 MED ORDER — DICLOFENAC SODIUM 1 % EX GEL
4.0000 g | Freq: Four times a day (QID) | CUTANEOUS | 0 refills | Status: AC
  Filled 2023-03-19: qty 100, 6d supply, fill #0

## 2023-03-19 MED ORDER — LISINOPRIL 20 MG PO TABS
40.0000 mg | ORAL_TABLET | Freq: Every day | ORAL | Status: DC
  Administered 2023-03-19: 40 mg via ORAL
  Filled 2023-03-19: qty 2

## 2023-03-19 MED ORDER — LEVEMIR FLEXTOUCH 100 UNIT/ML ~~LOC~~ SOPN: 15.0000 [IU] | PEN_INJECTOR | Freq: Every day | SUBCUTANEOUS | Status: AC

## 2023-03-19 MED ORDER — APIXABAN 5 MG PO TABS
5.0000 mg | ORAL_TABLET | Freq: Two times a day (BID) | ORAL | 1 refills | Status: AC
  Filled 2023-03-19: qty 60, 30d supply, fill #0

## 2023-03-19 MED ORDER — CLONIDINE HCL 0.1 MG PO TABS
0.1000 mg | ORAL_TABLET | Freq: Every day | ORAL | 11 refills | Status: AC
  Filled 2023-03-19: qty 60, 60d supply, fill #0

## 2023-03-19 MED ORDER — ORAL CARE MOUTH RINSE: 15.0000 mL | OROMUCOSAL | Status: DC | PRN

## 2023-03-19 MED ORDER — DICLOFENAC EPOLAMINE 1.3 % EX PTCH
1.0000 | MEDICATED_PATCH | Freq: Two times a day (BID) | CUTANEOUS | Status: DC
  Administered 2023-03-19: 1 via TRANSDERMAL
  Filled 2023-03-19 (×3): qty 1

## 2023-03-19 MED ORDER — FUROSEMIDE 20 MG PO TABS: 20.0000 mg | ORAL_TABLET | Freq: Every day | ORAL | Status: AC

## 2023-03-19 MED ORDER — LISINOPRIL 40 MG PO TABS: 40.0000 mg | ORAL_TABLET | Freq: Every day | ORAL | 3 refills | Status: AC

## 2023-03-19 NOTE — Discharge Instructions (Addendum)
Information on my medicine - ELIQUIS (apixaban)  This medication education was reviewed with me or my healthcare representative as part of my discharge preparation.  Why was Eliquis prescribed for you? Eliquis was prescribed to treat blood clots that may have been found in the veins of your legs (deep vein thrombosis) or in your lungs (pulmonary embolism) and to reduce the risk of them occurring again.  What do You need to know about Eliquis ? The dose is 5 mg  taken TWICE daily.  Eliquis may be taken with or without food.   Try to take the dose about the same time in the morning and in the evening. If you have difficulty swallowing the tablet whole please discuss with your pharmacist how to take the medication safely.  Take Eliquis exactly as prescribed and DO NOT stop taking Eliquis without talking to the doctor who prescribed the medication.  Stopping may increase your risk of developing a new blood clot.  Refill your prescription before you run out.  After discharge, you should have regular check-up appointments with your healthcare provider that is prescribing your Eliquis.    What do you do if you miss a dose? If a dose of ELIQUIS is not taken at the scheduled time, take it as soon as possible on the same day and twice-daily administration should be resumed. The dose should not be doubled to make up for a missed dose.  Important Safety Information A possible side effect of Eliquis is bleeding. You should call your healthcare provider right away if you experience any of the following: Bleeding from an injury or your nose that does not stop. Unusual colored urine (red or dark brown) or unusual colored stools (red or black). Unusual bruising for unknown reasons. A serious fall or if you hit your head (even if there is no bleeding).  Some medicines may interact with Eliquis and might increase your risk of bleeding or clotting while on Eliquis. To help avoid this, consult your  healthcare provider or pharmacist prior to using any new prescription or non-prescription medications, including herbals, vitamins, non-steroidal anti-inflammatory drugs (NSAIDs) and supplements.  This website has more information on Eliquis (apixaban): http://[email or web address].com/eliquis/home  

## 2023-03-19 NOTE — TOC Initial Note (Signed)
Transition of Care [facility]) - Initial/Assessment Note    Patient Details  Name: Teresa Kerr MRN: [id number] Date of Birth: December 18, 1950  Transition of Care [facility]) CM/SW Contact:    Kermit Balo, RN Phone Number: 03/19/2023, 1:38 PM  Clinical Narrative:                 Patient is from home alone. She has an aide 6 days a week for 3 hours a day.  She has walker and cane at home.  She has an elevated toilet seat but it is broken. BSC ordered for home through [facility] and will be delivered to her home.  Home health recommended and set up with [facility]. Information on the AVS.  Pt manages her own medications with occasional reminders from the aide.  Son will provide transportation home when medically ready. TOC following.   Expected Discharge Plan: Home w Home Health Services Barriers to Discharge: Continued Medical Work up   Patient Goals and CMS Choice   CMS Medicare.gov Compare Post Acute Care list provided to:: Patient Choice offered to / list presented to : Patient      Expected Discharge Plan and Services   Discharge Planning Services: CM Consult Post Acute Care Choice: Home Health, Durable Medical Equipment Living arrangements for the past 2 months: Apartment                 DME Arranged: Bedside commode DME Agency: [employer] Date DME Agency Contacted: 03/19/23   Representative spoke with at DME Agency: Lelon Mast HH Arranged: PT HH Agency: [facility] Date [facility] Agency Contacted: 03/19/23   Representative spoke with at [facility] Agency: Kandee Keen  Prior Living Arrangements/Services Living arrangements for the past 2 months: Apartment Lives with:: Self Patient language and need for interpreter reviewed:: Yes Do you feel safe going back to the place where you live?: Yes          Current home services: Homehealth aide Criminal Activity/Legal Involvement Pertinent to Current Situation/Hospitalization: No - Comment as needed  Activities of Daily  Living Home Assistive Devices/Equipment: Cane (specify quad or straight), Walker (specify type) (CGM) ADL Screening (condition at time of admission) Patient's cognitive ability adequate to safely complete daily activities?: Yes Is the patient deaf or have difficulty hearing?: Yes Does the patient have difficulty seeing, even when wearing glasses/contacts?: Yes Does the patient have difficulty concentrating, remembering, or making decisions?: Yes Patient able to express need for assistance with ADLs?: Yes Does the patient have difficulty dressing or bathing?: Yes Independently performs ADLs?: No Communication: Needs assistance (mild aphasia) Is this a change from baseline?: Pre-admission baseline Dressing (OT): Needs assistance Is this a change from baseline?: Pre-admission baseline Grooming: Needs assistance Is this a change from baseline?: Pre-admission baseline Feeding: Independent Bathing: Needs assistance Is this a change from baseline?: Pre-admission baseline Toileting: Independent In/Out Bed: Independent with device (comment) Walks in Home: Independent with device (comment) (cane/walker) Does the patient have difficulty walking or climbing stairs?: Yes Weakness of Legs: Right Weakness of Arms/Hands: Right  Permission Sought/Granted                  Emotional Assessment Appearance:: Appears stated age Attitude/Demeanor/Rapport: Engaged Affect (typically observed): Accepting Orientation: : Oriented to Self, Oriented to Place, Oriented to  Time, Oriented to Situation   Psych Involvement: No (comment)  Admission diagnosis:  Slurred speech [R47.81] Stroke-like symptoms [R29.90] Fall, initial encounter [W19.XXXA] Stroke-like symptom [R29.90] Patient Active Problem List   Diagnosis Date  Noted   Stroke-like symptom 03/19/2023   Stroke-like symptoms 03/18/2023   Dyslipidemia 03/18/2023   Impingement syndrome of right shoulder region 09/09/2019   Degeneration of lumbar  intervertebral disc 06/03/2018   Hip pain 06/03/2018   Knee pain 06/03/2018   RBBB 01/31/2017   Elevated troponin 01/31/2017   Influenza 01/16/2017   Anxiety 01/16/2017   Chest pain 01/16/2017   Diabetes mellitus with complication [facility])    COPD exacerbation (HCC)    Influenza B    Thyroid nodule    Acute respiratory failure with hypoxia (HCC)    Pleural effusion, R, ? hemothorax 04/24/2012   Community acquired pneumonia 04/20/2012   Hypoxia 04/20/2012   Shingles 04/20/2012   Rib pain 04/20/2012   Hypertension 04/20/2012   History of stroke 04/20/2012   Chronic anticoagulation 04/20/2012   Diabetes mellitus (HCC) 04/20/2012   FIBROIDS, UTERUS 05/16/2007   HX, PERSONAL, TOBACCO USE 05/16/2007   GOUT 05/14/2007   OBESITY 05/14/2007   Essential hypertension 05/14/2007   ALLERGIC RHINITIS, SEASONAL 05/14/2007   Asthma 05/14/2007   PCP:  Fleet Contras, MD Pharmacy:   [facility] [address], [place] - [address] [place] [address] Phone: [phone] Fax: [phone]  [id number] [place], [place] - [address] [place] [address] Phone: [phone] Fax: [phone]  [facility] - [place], [place] - [address] [place] [address] Phone: [phone] Fax: [phone]     Social Determinants of Health (SDOH) Social History: SDOH Screenings   Food Insecurity: No Food Insecurity (03/18/2023)  Housing: Low Risk  (03/18/2023)  Transportation Needs: No Transportation Needs (03/18/2023)  Utilities: Not At Risk (03/18/2023)  Tobacco Use: Medium Risk (03/18/2023)   SDOH Interventions:     Readmission Risk Interventions     No data to display

## 2023-03-19 NOTE — Inpatient Diabetes Management (Signed)
Inpatient Diabetes Program Recommendations  AACE/ADA: New Consensus Statement on Inpatient Glycemic Control (2015)  Target Ranges:  Prepandial:   less than 140 mg/dL      Peak postprandial:   less than 180 mg/dL (1-2 hours)      Critically ill patients:  140 - 180 mg/dL   Lab Results  Component Value Date   GLUCAP 206 (H) 03/19/2023   HGBA1C 12.3 (H) 03/18/2023    Review of Glycemic Control  Latest Reference Range & Units 03/18/23 16:35 03/18/23 21:53 03/19/23 06:19  Glucose-Capillary 70 - 99 mg/dL 161 (H) 096 (H) 045 (H)   Diabetes history: DM 2 Outpatient Diabetes medications:  Januvia 100 mg daily Levemir 15 units q HS Metformin 500 mg bid Current orders for Inpatient glycemic control:  Novolog (0-15 units) tid with meals and HS Semglee 15 units q HS  Inpatient Diabetes Program Recommendations:    Spoke with patient with family at bedside.  She states that her dr. Just increased her insulin to 15 units.  She goes to the MD's office to have the FS libre placed every 2 weeks.  She states that they provide transportation for this.  We discussed her current A1C of 12.3% and need for improved control. Patient verbalized understanding and says that her PCP is addressing this. Discussed with RN as well and asked her to mention to daughter at discharge.   Thanks,  Beryl Meager, RN, BC-ADM Inpatient Diabetes Coordinator Pager [phone]  (8a-5p)

## 2023-03-19 NOTE — Discharge Summary (Signed)
Physician Discharge Summary  SHALETHA HUMBLE WNU:[id number] DOB: 1951/08/20 DOA: 03/18/2023  PCP: Fleet Contras, MD  Admit date: 03/18/2023 Discharge date: 03/19/2023  Time spent: 50 minutes  Recommendations for Outpatient Follow-up:  Requires CBC Chem-7 in about 1 week--- note that Xarelto has been changed to Eliquis we gave a coupon card and this was called into the [facility] transition of care pharmacy Requires outpatient follow-up 6 weeks or so as per [facility] physician-Dr. Roda Shutters is aware Recommend close monitoring with blood sugars in the outpatient setting Permissive hypertension for next several days and then outpatient follow-up with PCP Would suggest therapy services in the outpatient setting-bedside commode ordered  Discharge Diagnoses:  MAIN problem for hospitalization   Acute stroke Prior complex stroke history status post IR interventions  Please see below for itemized issues addressed in HOpsital- refer to other progress notes for clarity if needed  Discharge Condition: Improved  Diet recommendation: Diabetic heart healthy  Filed Weights   03/18/23 1000 03/18/23 2027  Weight: 106 kg 100.9 kg    History of present illness:  72 year old black female Remote stroke not on Coumadin since 2018 first in 1999 ./?  2001 with left parietal cerebrovascular accident?  Embolic versus watershed--Left MCA stenosis with angioplasty Dr. Corliss Skains 08/2000-recurrent endovascular angioplasty 2007--- last follow-up Dr. 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 and is on Xarelto although missed a dose of it on 6/24 DM TY 2 HTN COPD/asthma   Brought to [facility] 12:30 AM fall X3 did hit her head unknown LOC called her home health aide that morning-garbled speech motor intact right-sided facial droop + deficit ABCD2 score 4 NIH score 3 CT head unchanged encephalomalacia left cerebral hemisphere consistent with prior infarct MR head numerous small  infarctions left hemisphere external capsule insula left parietal lobe-2 additional punctate infarcts right hemisphere suggestive of embolic disease no mass   Neurology consulted   Hospital-Problem based course   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  Defer to neurology-unclear if missing the Xarelto for 1 day would constitute DOAC failure Await PT OT eval SLP has evaluated and feels that she has some aphasia with anomic aphasia and dysarthria but that she should improve and we will order some PT OT and SLP at home Echo showed EF 60-65% with severe concentric LVH normal mitral valve normal aortic valve and the decision was made by neurology after discussion with patient etc. that patient would be best served on Eliquis twice daily which was called into transition of care pharmacy   Prior complicated stroke history as above   DM TY 2 A1c 12.3 indicative of poor control-Home Levemir is 15 units Sugars 100s to 290s Continue Lantus 15 eating 100% of meals Resumed home metformin and Januvia and will require tighter control in the outpatient setting by PCP   Hypertension Resume clonidine amlodipine Lasix lisinopril in 1 to 2 days allow permissive hypertension   Neuropathy NOS Resume gabapentin 300 twice daily  Discharge Exam: Vitals:   03/19/23 0718 03/19/23 1203  BP: (!) 157/92 (!) 142/78  Pulse: 80 85  Resp: 17 16  Temp: 98.3 F (36.8 C) 98.7 F (37.1 C)  SpO2: 94% 96%    Subj on day of d/c   Stable for discharge see my prior note and exam   Discharge Instructions   Discharge Instructions     Ambulatory referral to Neurology   Complete by: As directed  Follow up with stroke clinic NP (Jessica Senath or Darrol Angel, if both not available, consider Manson Allan, or Ahern) at [facility] in about 4 weeks. Thanks.   Diet - low sodium heart healthy   Complete by: As directed     Discharge instructions   Complete by: As directed    You had a new acute stroke probably from missing the dosing of your Xarelto and you were placed on Eliquis instead and we will call this into the downstairs pharmacy to make sure that this is affordable and something that you can go home with  It would be a good idea for you to follow-up with your regular physician in about 1 week and follow-up with the neurology service in about 6 weeks as per their recommendations--- when you have a stroke we do not-Decrease [blood pressure medications right away because your blood flow to the brain may be compromised slightly with a small stroke-as such we actually like to have a slightly higher blood pressure than usual-please resume your blood pressure medications as we have indicated to you  Please follow-up in the outpatient setting for regular labs keep on taking your insulins etc. in the outpatient setting and follow-up as we have described We have ordered the requisite therapy services in addition to bedside commode for you  Take care   Increase activity slowly   Complete by: As directed       Allergies as of 03/19/2023       Reactions   Norco [hydrocodone-acetaminophen] Itching   Latex Itching, Rash, Other (See Comments)   "Powdered gloves"         Medication List     STOP taking these medications    rivaroxaban 20 MG Tabs tablet Commonly known as: XARELTO       TAKE these medications    acetaminophen 325 MG tablet Commonly known as: TYLENOL Take 325-650 mg by mouth every 6 (six) hours as needed for mild pain, moderate pain, fever or headache.   amLODipine 10 MG tablet Commonly known as: NORVASC Take 10 mg by mouth daily.   apixaban 5 MG Tabs tablet Commonly known as: ELIQUIS Take 1 tablet (5 mg total) by mouth 2 (two) times daily.   atorvastatin 10 MG tablet Commonly known as: LIPITOR Take 10 mg by mouth daily.   cloNIDine 0.1 MG tablet Commonly known as:  CATAPRES Take 1 tablet (0.1 mg total) by mouth daily. Start taking on: March 21, 2023 What changed: These instructions start on March 21, 2023. If you are unsure what to do until then, ask your doctor or other care provider.   colchicine 0.6 MG tablet Take 0.6 mg by mouth daily as needed (gout).   diclofenac Sodium 1 % Gel Commonly known as: Voltaren Apply 4 g topically 4 (four) times daily.   DULoxetine 20 MG capsule Commonly known as: CYMBALTA Take 20 mg by mouth daily.   ergocalciferol 1.25 MG (50000 UT) capsule Commonly known as: VITAMIN D2 Take 50,000 Units by mouth See admin instructions. 50,000 units once weekly on Tuesday   Fluticasone-Salmeterol 250-50 MCG/DOSE Aepb Commonly known as: ADVAIR Inhale 1 puff into the lungs daily.   furosemide 20 MG tablet Commonly known as: LASIX Take 1 tablet (20 mg total) by mouth daily. Start taking on: March 21, 2023 What changed: These instructions start on March 21, 2023. If you are unsure what to do until then, ask your doctor or other care provider.   gabapentin 300 MG capsule Commonly  known as: NEURONTIN Take 300 mg by mouth 2 (two) times daily.   Januvia 100 MG tablet Generic drug: sitaGLIPtin Take 100 mg by mouth daily.   Levemir FlexTouch 100 UNIT/ML FlexPen Generic drug: insulin detemir Inject 15 Units into the skin at bedtime.   lisinopril 40 MG tablet Commonly known as: ZESTRIL Take 1 tablet (40 mg total) by mouth daily. Start taking on: March 21, 2023 What changed: These instructions start on March 21, 2023. If you are unsure what to do until then, ask your doctor or other care provider.   metFORMIN 500 MG tablet Commonly known as: GLUCOPHAGE Take 500 mg by mouth 2 (two) times daily.               Durable Medical Equipment  (From admission, onward)           Start     Ordered   03/19/23 1330  For home use only DME Bedside commode  Once       Question:  Patient needs a bedside commode to treat with  the following condition  Answer:  Weakness   03/19/23 1329           Allergies  Allergen Reactions   Norco [Hydrocodone-Acetaminophen] Itching   Latex Itching, Rash and Other (See Comments)    "Powdered gloves"     Follow-up Information     Care, [facility] Follow up.   Specialty: Home Health Services Why: The home health agency will contact you for the first home visit. Contact information: [address] [place] [address] [phone]         [facility]. Schedule an appointment as soon as possible for a visit in 1 month(s).   Specialty: Neurology Why: stroke clinic Contact information: [address] [place] [address] [phone]                 The results of significant diagnostics from this hospitalization (including imaging, microbiology, ancillary and laboratory) are listed below for reference.    Significant Diagnostic Studies: ECHOCARDIOGRAM COMPLETE  Result Date: 03/19/2023    ECHOCARDIOGRAM REPORT   Patient Name:   GERIANNE SIMONET [facility] Date of Exam: 03/19/2023 Medical Rec #:  [id number]            Height:       64.0 in Accession #:    [id number]           Weight:       222.4 lb Date of Birth:  1951/07/12             BSA:          2.047 m Patient Age:    71 years             BP:           142/78 mmHg Patient Gender: F                    HR:           75 bpm. Exam Location:  Inpatient Procedure: 2D Echo, Cardiac Doppler, Color Doppler, 3D Echo and Strain Analysis Indications:    Stroke  History:        Patient has prior history of Echocardiogram examinations, most                 recent 01/18/2017. Stroke, Arrythmias:RBBB; Risk  Factors:Diabetes, Hypertension and Former Smoker.  Sonographer:    Milda Smart Referring Phys: [id number] JENNIFER YATES  Sonographer Comments: Image acquisition challenging due to respiratory motion. Global longitudinal strain was attempted.  IMPRESSIONS  1. Left ventricular ejection fraction, by estimation, is 60 to 65%. Left ventricular ejection fraction by 3D volume is 61 %. The left ventricle has normal function. The left ventricle has no regional wall motion abnormalities. There is severe concentric  left ventricular hypertrophy. Left ventricular diastolic function could not be evaluated.  2. Right ventricular systolic function is normal. The right ventricular size is normal.  3. The mitral valve is normal in structure. No evidence of mitral valve regurgitation. No evidence of mitral stenosis. Moderate mitral annular calcification.  4. The aortic valve is normal in structure. Aortic valve regurgitation is not visualized. No aortic stenosis is present.  5. The inferior vena cava is normal in size with greater than 50% respiratory variability, suggesting right atrial pressure of 3 mmHg. FINDINGS  Left Ventricle: Left ventricular ejection fraction, by estimation, is 60 to 65%. Left ventricular ejection fraction by 3D volume is 61 %. The left ventricle has normal function. The left ventricle has no regional wall motion abnormalities. Global longitudinal strain performed but not reported based on interpreter judgement due to suboptimal tracking. The left ventricular internal cavity size was normal in size. There is severe concentric left ventricular hypertrophy. Left ventricular diastolic function could not be evaluated due to mitral annular calcification (moderate or greater). Left ventricular diastolic function could not be evaluated. Right Ventricle: The right ventricular size is normal. No increase in right ventricular wall thickness. Right ventricular systolic function is normal. Left Atrium: Left atrial size was normal in size. Right Atrium: Right atrial size was normal in size. Pericardium: There is no evidence of pericardial effusion. Presence of epicardial fat layer. Mitral Valve: The mitral valve is normal in structure. Moderate mitral annular  calcification. No evidence of mitral valve regurgitation. No evidence of mitral valve stenosis. MV peak gradient, 6.7 mmHg. The mean mitral valve gradient is 4.0 mmHg. Tricuspid Valve: The tricuspid valve is normal in structure. Tricuspid valve regurgitation is mild . No evidence of tricuspid stenosis. Aortic Valve: The aortic valve is normal in structure. Aortic valve regurgitation is not visualized. No aortic stenosis is present. Pulmonic Valve: The pulmonic valve was normal in structure. Pulmonic valve regurgitation is not visualized. No evidence of pulmonic stenosis. Aorta: The aortic root is normal in size and structure. Venous: The inferior vena cava is normal in size with greater than 50% respiratory variability, suggesting right atrial pressure of 3 mmHg. IAS/Shunts: No atrial level shunt detected by color flow Doppler.  LEFT VENTRICLE PLAX 2D LVIDd:         3.90 cm         Diastology LVIDs:         2.20 cm         LV e' medial:    4.46 cm/s LV PW:         1.60 cm         LV E/e' medial:  17.6 LV IVS:        1.90 cm         LV e' lateral:   4.13 cm/s LVOT diam:     2.10 cm         LV E/e' lateral: 19.0 LV SV:         65 LV SV Index:   32 LVOT Area:  3.46 cm        3D Volume EF                                LV 3D EF:    Left                                             ventricul LV Volumes (MOD)                            ar LV vol d, MOD    63.9 ml                    ejection A2C:                                        fraction LV vol d, MOD    106.0 ml                   by 3D A4C:                                        volume is LV vol s, MOD    20.2 ml                    61 %. A2C: LV vol s, MOD    15.2 ml A4C:                           3D Volume EF: LV SV MOD A2C:   43.7 ml       3D EF:        61 % LV SV MOD A4C:   106.0 ml LV SV MOD BP:    69.0 ml RIGHT VENTRICLE            IVC RV Basal diam:  3.30 cm    IVC diam: 1.50 cm RV S prime:     9.90 cm/s TAPSE (M-mode): 1.3 cm LEFT ATRIUM             Index         RIGHT ATRIUM           Index LA diam:        4.10 cm 2.00 cm/m   RA Area:     12.40 cm LA Vol (A2C):   50.4 ml 24.62 ml/m  RA Volume:   22.90 ml  11.19 ml/m LA Vol (A4C):   62.1 ml 30.34 ml/m LA Biplane Vol: 56.6 ml 27.65 ml/m  AORTIC VALVE LVOT Vmax:   89.60 cm/s LVOT Vmean:  65.300 cm/s LVOT VTI:    0.187 m  AORTA Ao Root diam: 3.00 cm Ao Asc diam:  3.00 cm MITRAL VALVE                TRICUSPID VALVE MV Area (PHT): 2.56 cm     TR Peak grad:   16.8 mmHg MV Area VTI:   1.79 cm     TR Vmax:  205.00 cm/s MV Peak grad:  6.7 mmHg MV Mean grad:  4.0 mmHg     SHUNTS MV Vmax:       1.29 m/s     Systemic VTI:  0.19 m MV Vmean:      91.2 cm/s    Systemic Diam: 2.10 cm MV Decel Time: 296 msec MR Peak grad: 30.2 mmHg MR Vmax:      275.00 cm/s MV E velocity: 78.60 cm/s MV A velocity: 116.00 cm/s MV E/A ratio:  0.68 Kardie Tobb DO Electronically signed by Thomasene Ripple DO Signature Date/Time: 03/19/2023/12:41:38 PM    Final    MR BRAIN WO CONTRAST  Result Date: 03/18/2023 CLINICAL DATA:  Neuro deficit, acute, stroke suspected. EXAM: MRI HEAD WITHOUT CONTRAST TECHNIQUE: Multiplanar, multiecho pulse sequences of the brain and surrounding structures were obtained without intravenous contrast. COMPARISON:  CT studies same day.  MRI 01/20/2020. FINDINGS: Brain: Diffusion imaging shows numerous small acute infarctions affecting the left hemisphere in the region of the external capsule, deep insula and left parietal lobe. There are 2 punctate acute infarctions in the right hemisphere, 1 at the frontoparietal junction in the other in the mesial temporal lobe. This distribution suggests embolic disease from the heart or ascending aorta. No large confluent infarction. No sign of mass effect or acute hemorrhage. Elsewhere, chronic small-vessel ischemic changes are seen affecting the cerebellum and cerebral hemispheric white matter. There is old infarction in the left posterior frontal lobe and left parietal lobe.  Vascular: Major vessels at the base of the brain show flow. Skull and upper cervical spine: Negative Sinuses/Orbits: Clear/normal Other: None IMPRESSION: 1. Numerous small acute infarctions affecting the left hemisphere in the region of the external capsule, deep insula and left parietal lobe. 2 punctate acute infarctions in the right hemisphere, 1 at the frontoparietal junction in the other in the mesial temporal lobe. This distribution suggests embolic disease from the heart or ascending aorta. No large confluent infarction. No sign of mass effect or acute hemorrhage. 2. Chronic small-vessel ischemic changes elsewhere affecting the cerebellum and cerebral hemispheric white matter. Old infarction in the left posterior frontal lobe and left parietal lobe. Electronically Signed   By: Paulina Fusi M.D.   On: 03/18/2023 16:27   DG CHEST PORT 1 VIEW  Result Date: 03/18/2023 CLINICAL DATA:  Right rib contusion EXAM: PORTABLE CHEST 1 VIEW COMPARISON:  Chest radiograph dated 01/16/2017 FINDINGS: Normal lung volumes. No focal consolidations. No pleural effusion or pneumothorax. Enlarged cardiomediastinal silhouette. No radiographic finding of acute displaced fracture. IMPRESSION: 1. No radiographic finding of acute displaced fracture. 2. Cardiomegaly. Electronically Signed   By: Agustin Cree M.D.   On: 03/18/2023 14:50   CT ANGIO HEAD NECK W WO CM W PERF (CODE STROKE)  Result Date: 03/18/2023 CLINICAL DATA:  Neuro deficit, acute, stroke suspected. Slurred speech. Left facial droop. EXAM: CT ANGIOGRAPHY HEAD AND NECK CT PERFUSION BRAIN TECHNIQUE: Multidetector CT imaging of the head and neck was performed using the standard protocol during bolus administration of intravenous contrast. Multiplanar CT image reconstructions and MIPs were obtained to evaluate the vascular anatomy. Carotid stenosis measurements (when applicable) are obtained utilizing NASCET criteria, using the distal internal carotid diameter as the  denominator. Multiphase CT imaging of the brain was performed following IV bolus contrast injection. Subsequent parametric perfusion maps were calculated using RAPID software. RADIATION DOSE REDUCTION: This exam was performed according to the departmental dose-optimization program which includes automated exposure control, adjustment of the mA and/or kV according to  patient size and/or use of iterative reconstruction technique. CONTRAST:  OMNIPAQUE IOHEXOL 350 MG/ML SOLN COMPARISON:  Head CT immediately prior. FINDINGS: CTA NECK FINDINGS Aortic arch: Aortic atherosclerosis. Branching pattern is normal without origin stenosis. Right carotid system: Common carotid artery widely patent to the bifurcation. Soft and calcified plaque at the distal common carotid artery but no stenosis. Carotid bifurcation and ICA bulb widely patent. Cervical ICA normal beyond that. Left carotid system: Common carotid artery shows some scattered plaque but is widely patent to the bifurcation. Soft and calcified plaque at the distal common carotid artery and carotid bifurcation but no stenosis. Cervical ICA widely patent to the skull base. Vertebral arteries: Both vertebral artery origins are patent. Both vertebral arteries are patent through the cervical region to the foramen magnum. Skeleton: Mild cervical spondylosis. Other neck: Enlarged and heterogeneous thyroid gland with multiple nodules, measuring up to 4 cm in size. Non emergent thyroid ultrasound recommended for further evaluation. No regional lymphadenopathy. Enhancing mass of the left parotid gland measuring 1 cm in size. This is larger than was seen on a MRI study of February 2020. Parotid ultrasound recommended for further evaluation. Upper chest: Mild emphysema.  No active process. Review of the MIP images confirms the above findings CTA HEAD FINDINGS Anterior circulation: Both internal carotid arteries are patent through the skull base and siphon regions. There is  ordinary siphon atherosclerotic calcification but no stenosis greater than 30% suspected. On the right, the anterior and middle cerebral arteries are patent without flow limiting stenosis. On the left, there is atherosclerotic change in the siphon but without stenosis greater than 30%. The anterior and middle cerebral vessels are patent. Mild chronic narrowing of the M1 segment without acute large vessel occlusion in that territory. Posterior circulation: Both vertebral arteries are patent through the foramen magnum to the basilar artery. The superior cerebellar and posterior cerebral arteries remain patent. There is pronounced atherosclerotic irregularity in the PCA branches, more extensive on the right than the left. Venous sinuses: Patent and normal. Anatomic variants: None other significant. Review of the MIP images confirms the above findings CT Brain Perfusion Findings: ASPECTS: 10, allowing for the old infarctions on the left. CBF (<30%) Volume: 0mL Perfusion (Tmax>6.0s) volume: 0mL Mismatch Volume: 0mL Infarction Location:None IMPRESSION: 1. No acute large vessel occlusion. 2. Atherosclerotic disease at both carotid bifurcations but without stenosis. 3. Atherosclerotic disease in both carotid siphon regions but without stenosis greater than 30%. 4. Chronic atherosclerotic narrowing of the left M1 segment without acute occlusion. 5. Pronounced atherosclerotic irregularity in the PCA branches, more extensive on the right than the left. 6. No acute perfusion abnormality. 7. Enlarged and heterogeneous thyroid gland with multiple nodules, measuring up to 4 cm in size. Non emergent thyroid ultrasound recommended for further evaluation. 8. Enhancing mass of the left parotid gland measuring 1 cm in size. This is larger than was seen on a MRI study of February 2020. Parotid ultrasound recommended for further evaluation. Aortic Atherosclerosis (ICD10-I70.0) and Emphysema (ICD10-J43.9). Electronically Signed   By: Paulina Fusi M.D.   On: 03/18/2023 11:16   CT HEAD CODE STROKE WO CONTRAST  Result Date: 03/18/2023 CLINICAL DATA:  Code stroke. Neuro deficit, acute, stroke suspected. Slurred speech. Left-sided facial droop. EXAM: CT HEAD WITHOUT CONTRAST TECHNIQUE: Contiguous axial images were obtained from the base of the skull through the vertex without intravenous contrast. RADIATION DOSE REDUCTION: This exam was performed according to the departmental dose-optimization program which includes automated exposure control, adjustment of the  mA and/or kV according to patient size and/or use of iterative reconstruction technique. COMPARISON:  07/17/2021 CT.  01/20/2020 MRI. FINDINGS: Brain: No focal abnormality seen affecting the brainstem or cerebellum. Right cerebral hemisphere shows chronic small-vessel ischemic changes, most pronounced in the forceps major region. Left cerebral hemisphere shows chronic small-vessel ischemic changes of the white matter and old cortical/subcortical infarction in the left posterior frontal lobe and left parietal lobe. No sign of acute infarction by CT. No mass, hemorrhage, hydrocephalus or extra-axial collection. Vascular: There is atherosclerotic calcification of the major vessels at the base of the brain. Skull: Negative Sinuses/Orbits: Clear/normal Other: None ASPECTS (Alberta Stroke Program Early CT Score) - Ganglionic level infarction (caudate, lentiform nuclei, internal capsule, insula, M1-M3 cortex): 7, allowing for the old infarctions. - Supraganglionic infarction (M4-M6 cortex): 3, allowing for the old infarctions. Total score (0-10 with 10 being normal): 10, allowing for the old infarctions. IMPRESSION: 1. No acute finding by CT. Chronic small-vessel ischemic changes of the cerebral hemispheric white matter. 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 2. Aspects is 10, allowing for the old infarctions. These results were communicated to  Dr. Roda Shutters at 11:03 am on 03/18/2023 by text page via the [facility] messaging system. Electronically Signed   By: Paulina Fusi M.D.   On: 03/18/2023 11:04    Microbiology: No results found for this or any previous visit (from the past 240 hour(s)).   Labs: Basic Metabolic Panel: Recent Labs  Lab 03/18/23 1043 03/18/23 1049 03/19/23 1029  NA 135 138 136  K 3.8 4.0 4.2  CL 101 102 102  CO2 25  --  23  GLUCOSE 324* 321* 308*  BUN 19 22 16   CREATININE 1.11* 1.10* 0.95  CALCIUM 8.8*  --  8.8*   Liver Function Tests: Recent Labs  Lab 03/18/23 1043  AST 19  ALT 19  ALKPHOS 81  BILITOT 0.5  PROT 6.9  ALBUMIN 3.3*   No results for input(s): "LIPASE", "AMYLASE" in the last 168 hours. No results for input(s): "AMMONIA" in the last 168 hours. CBC: Recent Labs  Lab 03/18/23 1043 03/18/23 1049  WBC 12.0*  --   NEUTROABS 8.4*  --   HGB 14.3 15.0  HCT 43.3 44.0  MCV 86.1  --   PLT 257  --    Cardiac Enzymes: No results for input(s): "CKTOTAL", "CKMB", "CKMBINDEX", "TROPONINI" in the last 168 hours. BNP: BNP (last 3 results) No results for input(s): "BNP" in the last 8760 hours.  ProBNP (last 3 results) No results for input(s): "PROBNP" in the last 8760 hours.  CBG: Recent Labs  Lab 03/18/23 1042 03/18/23 1635 03/18/23 2153 03/19/23 0619  GLUCAP 293* 302* 164* 206*       Signed:  Rhetta Mura MD   [facility] 03/19/2023, 2:26 PM

## 2023-03-19 NOTE — Progress Notes (Signed)
STROKE TEAM PROGRESS NOTE   SUBJECTIVE (INTERVAL HISTORY) No family is at the bedside.  Overall her condition is gradually improving. Her speech and right facial drop has improved, no significant aphasia.    OBJECTIVE Temp:  [97.8 F (36.6 C)-98.7 F (37.1 C)] 98.7 F (37.1 C) (06/25 1203) Pulse Rate:  [34-85] 85 (06/25 1203) Cardiac Rhythm: Normal sinus rhythm;Bundle branch block (06/25 0723) Resp:  [16-26] 16 (06/25 1203) BP: (142-193)/(77-139) 142/78 (06/25 1203) SpO2:  [93 %-100 %] 96 % (06/25 1203) Weight:  [100.9 kg] 100.9 kg (06/24 2027)  Recent Labs  Lab 03/18/23 1042 03/18/23 1635 03/18/23 2153 03/19/23 0619  GLUCAP 293* 302* 164* 206*   Recent Labs  Lab 03/18/23 1043 03/18/23 1049 03/19/23 1029  NA 135 138 136  K 3.8 4.0 4.2  CL 101 102 102  CO2 25  --  23  GLUCOSE 324* 321* 308*  BUN 19 22 16   CREATININE 1.11* 1.10* 0.95  CALCIUM 8.8*  --  8.8*   Recent Labs  Lab 03/18/23 1043  AST 19  ALT 19  ALKPHOS 81  BILITOT 0.5  PROT 6.9  ALBUMIN 3.3*   Recent Labs  Lab 03/18/23 1043 03/18/23 1049  WBC 12.0*  --   NEUTROABS 8.4*  --   HGB 14.3 15.0  HCT 43.3 44.0  MCV 86.1  --   PLT 257  --    No results for input(s): "CKTOTAL", "CKMB", "CKMBINDEX", "TROPONINI" in the last 168 hours. Recent Labs    03/18/23 1043  LABPROT 19.7*  INR 1.6*   No results for input(s): "COLORURINE", "LABSPEC", "PHURINE", "GLUCOSEU", "HGBUR", "BILIRUBINUR", "KETONESUR", "PROTEINUR", "UROBILINOGEN", "NITRITE", "LEUKOCYTESUR" in the last 72 hours.  Invalid input(s): "APPERANCEUR"     Component Value Date/Time   CHOL 174 03/19/2023 0403   TRIG 194 (H) 03/19/2023 0403   HDL 33 (L) 03/19/2023 0403   CHOLHDL 5.3 03/19/2023 0403   VLDL 39 03/19/2023 0403   LDLCALC 102 (H) 03/19/2023 0403   Lab Results  Component Value Date   HGBA1C 12.3 (H) 03/18/2023      Component Value Date/Time   LABOPIA NONE DETECTED 03/18/2023 1435   COCAINSCRNUR NONE DETECTED 03/18/2023  1435   LABBENZ NONE DETECTED 03/18/2023 1435   AMPHETMU NONE DETECTED 03/18/2023 1435   THCU NONE DETECTED 03/18/2023 1435   LABBARB NONE DETECTED 03/18/2023 1435    Recent Labs  Lab 03/18/23 1043  ETH <10    I have personally reviewed the radiological images below and agree with the radiology interpretations.  ECHOCARDIOGRAM COMPLETE  Result Date: 03/19/2023    ECHOCARDIOGRAM REPORT   Patient Name:   Teresa Kerr [facility] Date of Exam: 03/19/2023 Medical Rec #:  [id number]            Height:       64.0 in Accession #:    [id number]           Weight:       222.4 lb Date of Birth:  1951/03/06             BSA:          2.047 m Patient Age:    71 years             BP:           142/78 mmHg Patient Gender: F                    HR:  75 bpm. Exam Location:  Inpatient Procedure: 2D Echo, Cardiac Doppler, Color Doppler, 3D Echo and Strain Analysis Indications:    Stroke  History:        Patient has prior history of Echocardiogram examinations, most                 recent 01/18/2017. Stroke, Arrythmias:RBBB; Risk                 Factors:Diabetes, Hypertension and Former Smoker.  Sonographer:    Milda Smart Referring Phys: [id number] JENNIFER YATES  Sonographer Comments: Image acquisition challenging due to respiratory motion. Global longitudinal strain was attempted. IMPRESSIONS  1. Left ventricular ejection fraction, by estimation, is 60 to 65%. Left ventricular ejection fraction by 3D volume is 61 %. The left ventricle has normal function. The left ventricle has no regional wall motion abnormalities. There is severe concentric  left ventricular hypertrophy. Left ventricular diastolic function could not be evaluated.  2. Right ventricular systolic function is normal. The right ventricular size is normal.  3. The mitral valve is normal in structure. No evidence of mitral valve regurgitation. No evidence of mitral stenosis. Moderate mitral annular calcification.  4. The aortic valve is normal in structure.  Aortic valve regurgitation is not visualized. No aortic stenosis is present.  5. The inferior vena cava is normal in size with greater than 50% respiratory variability, suggesting right atrial pressure of 3 mmHg. FINDINGS  Left Ventricle: Left ventricular ejection fraction, by estimation, is 60 to 65%. Left ventricular ejection fraction by 3D volume is 61 %. The left ventricle has normal function. The left ventricle has no regional wall motion abnormalities. Global longitudinal strain performed but not reported based on interpreter judgement due to suboptimal tracking. The left ventricular internal cavity size was normal in size. There is severe concentric left ventricular hypertrophy. Left ventricular diastolic function could not be evaluated due to mitral annular calcification (moderate or greater). Left ventricular diastolic function could not be evaluated. Right Ventricle: The right ventricular size is normal. No increase in right ventricular wall thickness. Right ventricular systolic function is normal. Left Atrium: Left atrial size was normal in size. Right Atrium: Right atrial size was normal in size. Pericardium: There is no evidence of pericardial effusion. Presence of epicardial fat layer. Mitral Valve: The mitral valve is normal in structure. Moderate mitral annular calcification. No evidence of mitral valve regurgitation. No evidence of mitral valve stenosis. MV peak gradient, 6.7 mmHg. The mean mitral valve gradient is 4.0 mmHg. Tricuspid Valve: The tricuspid valve is normal in structure. Tricuspid valve regurgitation is mild . No evidence of tricuspid stenosis. Aortic Valve: The aortic valve is normal in structure. Aortic valve regurgitation is not visualized. No aortic stenosis is present. Pulmonic Valve: The pulmonic valve was normal in structure. Pulmonic valve regurgitation is not visualized. No evidence of pulmonic stenosis. Aorta: The aortic root is normal in size and structure. Venous: The  inferior vena cava is normal in size with greater than 50% respiratory variability, suggesting right atrial pressure of 3 mmHg. IAS/Shunts: No atrial level shunt detected by color flow Doppler.  LEFT VENTRICLE PLAX 2D LVIDd:         3.90 cm         Diastology LVIDs:         2.20 cm         LV e' medial:    4.46 cm/s LV PW:         1.60 cm  LV E/e' medial:  17.6 LV IVS:        1.90 cm         LV e' lateral:   4.13 cm/s LVOT diam:     2.10 cm         LV E/e' lateral: 19.0 LV SV:         65 LV SV Index:   32 LVOT Area:     3.46 cm        3D Volume EF                                LV 3D EF:    Left                                             ventricul LV Volumes (MOD)                            ar LV vol d, MOD    63.9 ml                    ejection A2C:                                        fraction LV vol d, MOD    106.0 ml                   by 3D A4C:                                        volume is LV vol s, MOD    20.2 ml                    61 %. A2C: LV vol s, MOD    15.2 ml A4C:                           3D Volume EF: LV SV MOD A2C:   43.7 ml       3D EF:        61 % LV SV MOD A4C:   106.0 ml LV SV MOD BP:    69.0 ml RIGHT VENTRICLE            IVC RV Basal diam:  3.30 cm    IVC diam: 1.50 cm RV S prime:     9.90 cm/s TAPSE (M-mode): 1.3 cm LEFT ATRIUM             Index        RIGHT ATRIUM           Index LA diam:        4.10 cm 2.00 cm/m   RA Area:     12.40 cm LA Vol (A2C):   50.4 ml 24.62 ml/m  RA Volume:   22.90 ml  11.19 ml/m LA Vol (A4C):   62.1 ml 30.34 ml/m LA Biplane Vol: 56.6 ml 27.65 ml/m  AORTIC VALVE LVOT Vmax:   89.60 cm/s LVOT Vmean:  65.300 cm/s  LVOT VTI:    0.187 m  AORTA Ao Root diam: 3.00 cm Ao Asc diam:  3.00 cm MITRAL VALVE                TRICUSPID VALVE MV Area (PHT): 2.56 cm     TR Peak grad:   16.8 mmHg MV Area VTI:   1.79 cm     TR Vmax:        205.00 cm/s MV Peak grad:  6.7 mmHg MV Mean grad:  4.0 mmHg     SHUNTS MV Vmax:       1.29 m/s     Systemic VTI:  0.19 m MV  Vmean:      91.2 cm/s    Systemic Diam: 2.10 cm MV Decel Time: 296 msec MR Peak grad: 30.2 mmHg MR Vmax:      275.00 cm/s MV E velocity: 78.60 cm/s MV A velocity: 116.00 cm/s MV E/A ratio:  0.68 Kardie Tobb DO Electronically signed by Thomasene Ripple DO Signature Date/Time: 03/19/2023/12:41:38 PM    Final    MR BRAIN WO CONTRAST  Result Date: 03/18/2023 CLINICAL DATA:  Neuro deficit, acute, stroke suspected. EXAM: MRI HEAD WITHOUT CONTRAST TECHNIQUE: Multiplanar, multiecho pulse sequences of the brain and surrounding structures were obtained without intravenous contrast. COMPARISON:  CT studies same day.  MRI 01/20/2020. FINDINGS: Brain: Diffusion imaging shows numerous small acute infarctions affecting the left hemisphere in the region of the external capsule, deep insula and left parietal lobe. There are 2 punctate acute infarctions in the right hemisphere, 1 at the frontoparietal junction in the other in the mesial temporal lobe. This distribution suggests embolic disease from the heart or ascending aorta. No large confluent infarction. No sign of mass effect or acute hemorrhage. Elsewhere, chronic small-vessel ischemic changes are seen affecting the cerebellum and cerebral hemispheric white matter. There is old infarction in the left posterior frontal lobe and left parietal lobe. Vascular: Major vessels at the base of the brain show flow. Skull and upper cervical spine: Negative Sinuses/Orbits: Clear/normal Other: None IMPRESSION: 1. Numerous small acute infarctions affecting the left hemisphere in the region of the external capsule, deep insula and left parietal lobe. 2 punctate acute infarctions in the right hemisphere, 1 at the frontoparietal junction in the other in the mesial temporal lobe. This distribution suggests embolic disease from the heart or ascending aorta. No large confluent infarction. No sign of mass effect or acute hemorrhage. 2. Chronic small-vessel ischemic changes elsewhere affecting the  cerebellum and cerebral hemispheric white matter. Old infarction in the left posterior frontal lobe and left parietal lobe. Electronically Signed   By: Paulina Fusi M.D.   On: 03/18/2023 16:27   DG CHEST PORT 1 VIEW  Result Date: 03/18/2023 CLINICAL DATA:  Right rib contusion EXAM: PORTABLE CHEST 1 VIEW COMPARISON:  Chest radiograph dated 01/16/2017 FINDINGS: Normal lung volumes. No focal consolidations. No pleural effusion or pneumothorax. Enlarged cardiomediastinal silhouette. No radiographic finding of acute displaced fracture. IMPRESSION: 1. No radiographic finding of acute displaced fracture. 2. Cardiomegaly. Electronically Signed   By: Agustin Cree M.D.   On: 03/18/2023 14:50   CT ANGIO HEAD NECK W WO CM W PERF (CODE STROKE)  Result Date: 03/18/2023 CLINICAL DATA:  Neuro deficit, acute, stroke suspected. Slurred speech. Left facial droop. EXAM: CT ANGIOGRAPHY HEAD AND NECK CT PERFUSION BRAIN TECHNIQUE: Multidetector CT imaging of the head and neck was performed using the standard protocol during bolus administration of intravenous contrast. Multiplanar CT image reconstructions and  MIPs were obtained to evaluate the vascular anatomy. Carotid stenosis measurements (when applicable) are obtained utilizing NASCET criteria, using the distal internal carotid diameter as the denominator. Multiphase CT imaging of the brain was performed following IV bolus contrast injection. Subsequent parametric perfusion maps were calculated using RAPID software. RADIATION DOSE REDUCTION: This exam was performed according to the departmental dose-optimization program which includes automated exposure control, adjustment of the mA and/or kV according to patient size and/or use of iterative reconstruction technique. CONTRAST:  OMNIPAQUE IOHEXOL 350 MG/ML SOLN COMPARISON:  Head CT immediately prior. FINDINGS: CTA NECK FINDINGS Aortic arch: Aortic atherosclerosis. Branching pattern is normal without origin stenosis. Right  carotid system: Common carotid artery widely patent to the bifurcation. Soft and calcified plaque at the distal common carotid artery but no stenosis. Carotid bifurcation and ICA bulb widely patent. Cervical ICA normal beyond that. Left carotid system: Common carotid artery shows some scattered plaque but is widely patent to the bifurcation. Soft and calcified plaque at the distal common carotid artery and carotid bifurcation but no stenosis. Cervical ICA widely patent to the skull base. Vertebral arteries: Both vertebral artery origins are patent. Both vertebral arteries are patent through the cervical region to the foramen magnum. Skeleton: Mild cervical spondylosis. Other neck: Enlarged and heterogeneous thyroid gland with multiple nodules, measuring up to 4 cm in size. Non emergent thyroid ultrasound recommended for further evaluation. No regional lymphadenopathy. Enhancing mass of the left parotid gland measuring 1 cm in size. This is larger than was seen on a MRI study of February 2020. Parotid ultrasound recommended for further evaluation. Upper chest: Mild emphysema.  No active process. Review of the MIP images confirms the above findings CTA HEAD FINDINGS Anterior circulation: Both internal carotid arteries are patent through the skull base and siphon regions. There is ordinary siphon atherosclerotic calcification but no stenosis greater than 30% suspected. On the right, the anterior and middle cerebral arteries are patent without flow limiting stenosis. On the left, there is atherosclerotic change in the siphon but without stenosis greater than 30%. The anterior and middle cerebral vessels are patent. Mild chronic narrowing of the M1 segment without acute large vessel occlusion in that territory. Posterior circulation: Both vertebral arteries are patent through the foramen magnum to the basilar artery. The superior cerebellar and posterior cerebral arteries remain patent. There is pronounced  atherosclerotic irregularity in the PCA branches, more extensive on the right than the left. Venous sinuses: Patent and normal. Anatomic variants: None other significant. Review of the MIP images confirms the above findings CT Brain Perfusion Findings: ASPECTS: 10, allowing for the old infarctions on the left. CBF (<30%) Volume: 0mL Perfusion (Tmax>6.0s) volume: 0mL Mismatch Volume: 0mL Infarction Location:None IMPRESSION: 1. No acute large vessel occlusion. 2. Atherosclerotic disease at both carotid bifurcations but without stenosis. 3. Atherosclerotic disease in both carotid siphon regions but without stenosis greater than 30%. 4. Chronic atherosclerotic narrowing of the left M1 segment without acute occlusion. 5. Pronounced atherosclerotic irregularity in the PCA branches, more extensive on the right than the left. 6. No acute perfusion abnormality. 7. Enlarged and heterogeneous thyroid gland with multiple nodules, measuring up to 4 cm in size. Non emergent thyroid ultrasound recommended for further evaluation. 8. Enhancing mass of the left parotid gland measuring 1 cm in size. This is larger than was seen on a MRI study of February 2020. Parotid ultrasound recommended for further evaluation. Aortic Atherosclerosis (ICD10-I70.0) and Emphysema (ICD10-J43.9). Electronically Signed   By: Scherrie Bateman.D.  On: 03/18/2023 11:16   CT HEAD CODE STROKE WO CONTRAST  Result Date: 03/18/2023 CLINICAL DATA:  Code stroke. Neuro deficit, acute, stroke suspected. Slurred speech. Left-sided facial droop. EXAM: CT HEAD WITHOUT CONTRAST TECHNIQUE: Contiguous axial images were obtained from the base of the skull through the vertex without intravenous contrast. RADIATION DOSE REDUCTION: This exam was performed according to the departmental dose-optimization program which includes automated exposure control, adjustment of the mA and/or kV according to patient size and/or use of iterative reconstruction technique. COMPARISON:   07/17/2021 CT.  01/20/2020 MRI. FINDINGS: Brain: No focal abnormality seen affecting the brainstem or cerebellum. Right cerebral hemisphere shows chronic small-vessel ischemic changes, most pronounced in the forceps major region. Left cerebral hemisphere shows chronic small-vessel ischemic changes of the white matter and old cortical/subcortical infarction in the left posterior frontal lobe and left parietal lobe. No sign of acute infarction by CT. No mass, hemorrhage, hydrocephalus or extra-axial collection. Vascular: There is atherosclerotic calcification of the major vessels at the base of the brain. Skull: Negative Sinuses/Orbits: Clear/normal Other: None ASPECTS (Alberta Stroke Program Early CT Score) - Ganglionic level infarction (caudate, lentiform nuclei, internal capsule, insula, M1-M3 cortex): 7, allowing for the old infarctions. - Supraganglionic infarction (M4-M6 cortex): 3, allowing for the old infarctions. Total score (0-10 with 10 being normal): 10, allowing for the old infarctions. IMPRESSION: 1. No acute finding by CT. Chronic small-vessel ischemic changes of the cerebral hemispheric white matter. 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 2. Aspects is 10, allowing for the old infarctions. These results were communicated to Dr. Roda Shutters at 11:03 am on 03/18/2023 by text page via the [facility] messaging system. Electronically Signed   By: Paulina Fusi M.D.   On: 03/18/2023 11:04     PHYSICAL EXAM  Temp:  [97.8 F (36.6 C)-98.7 F (37.1 C)] 98.7 F (37.1 C) (06/25 1203) Pulse Rate:  [34-85] 85 (06/25 1203) Resp:  [16-26] 16 (06/25 1203) BP: (142-193)/(77-139) 142/78 (06/25 1203) SpO2:  [93 %-100 %] 96 % (06/25 1203) Weight:  [100.9 kg] 100.9 kg (06/24 2027)  General - Well nourished, well developed, in no apparent distress.  Ophthalmologic - fundi not visualized due to noncooperation.  Cardiovascular - Regular rhythm and rate.  Mental  Status -  Level of arousal and orientation to time, place, and person were intact. Language including expression, naming, repetition, comprehension was assessed and found intact. Mild dysarthria Fund of Knowledge was assessed and was intact.   Cranial Nerves II - XII - II - Vision intact OU. III, IV, VI - Extraocular movements intact. V - Facial sensation intact bilaterally. VII - Right mild facial droop VIII - Hearing & vestibular intact bilaterally. X - Palate elevates symmetrically.  Mild dysarthria XI - Chin turning & shoulder shrug intact bilaterally. XII - Tongue protrusion intact.   Motor Strength - The patient's strength was normal in all extremities and pronator drift was absent.    Motor Tone & Bulk - Muscle tone was assessed at the neck and appendages and was normal.  Bulk was normal and fasciculations were absent.    Reflexes - The patient's reflexes were normal in all extremities and she had no pathological reflexes.   Sensory - Light touch, temperature/pinprick were assessed and were normal.     Coordination - The patient had normal movements in the hands and feet with no ataxia or dysmetria.  Tremor was absent.   Gait and Station - deferred   NIHSS  3 for dysarthria, mild aphasia, right facial droop   ASSESSMENT/PLAN Teresa Kerr is a 72 y.o. 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 Corliss Skains presented to ED for slurred speech and right facial droop.  No tPA given due to outside window and on Xarelto.    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  CTA head and neck no LVO, but chronic left M1 stenosis, atherosclerosis bilateral ICA siphon and bulb, bilateral PCA stenosis right more than left.  MRI  Numerous small acute infarctions affecting the left hemisphere in the  region of the external capsule, deep insula and left parietal lobe. 2 punctate acute infarctions in the right hemisphere, 1 at the frontoparietal junction in the other in the mesial temporal lobe 2D Echo  EF 60-65% LDL 102 HgbA1c 12.3 UDS neg ELiquis for VTE prophylaxis Xarelto (rivaroxaban) daily prior to admission, now on aspirin 325 mg daily. Discussed with pt and will switch to eliquis today Patient counseled to be compliant with her antithrombotic medications Ongoing aggressive stroke risk factor management Therapy recommendations:  HH PT Disposition:  pending  Hx of strokes with angioplasty of left MCA strokes in 1999, 2001 and 2002.   Had left MCA angioplasty in 2001 and 2002 with Dr. Corliss Skains.   Last follow-up with Dr. Corliss Skains in 2021 had MRI showed chronic left MCA territory infarct and MRA head showed at least moderate stenosis left cavernous ICA, progressed from before.  Severe stenosis right MCA bifurcation, stable.  Moderate diffuse stenosis proximal left P2, severe left P2 and distal right P3 stenosis.   Diabetes HgbA1c 12.3 goal < 7.0 Uncontrolled Currently on insulin and Tradjenta CBG monitoring SSI DM education and close PCP follow up for better DM control  Hypertension Stable on the high end Resume home lisinopril 40 Long term BP goal normotensive  Hyperlipidemia Home meds:  lipitor 10  LDL 102, goal < 70 Now on lipitor 40 Continue statin at discharge  Other Stroke Risk Factors Advanced age Former smoker Obesity, Body mass index is 38.18 kg/m.  Coronary artery disease/NSTEMI PVD DVT on Xarelto PTA  Other Active Problems Leukocytosis WBC 12.0  Hospital day # 0  Neurology will sign off. Please call with questions. Pt will follow up with stroke clinic NP at [facility] in about 4 weeks. Thanks for the consult.   Marvel Plan, MD PhD Stroke Neurology 03/19/2023 1:54 PM    To contact Stroke Continuity provider, please refer to [email or web address]. After hours,  contact General Neurology

## 2023-03-19 NOTE — TOC Transition Note (Addendum)
Transition of Care [facility]) - CM/SW Discharge Note   Patient Details  Name: Teresa Kerr MRN: [id number] Date of Birth: August 11, 1951  Transition of Care [facility] CM/SW Contact:  Kermit Balo, RN Phone Number: 03/19/2023, 2:55 PM   Clinical Narrative:    Pt is discharging home with home health services through [place]. Information on the AVS. Pt provided 30 day free card for Eliquis.  Pt has transportation home.  1610: pt is discharging to her daughter's home at: [address] [place]. HH is aware.  Rollator ordered through [employer] and will be delivered to the room.    Final next level of care: Home w Home Health Services Barriers to Discharge: No Barriers Identified   Patient Goals and CMS Choice CMS Medicare.gov Compare Post Acute Care list provided to:: Patient Choice offered to / list presented to : Patient  Discharge Placement                         Discharge Plan and Services Additional resources added to the After Visit Summary for     Discharge Planning Services: CM Consult Post Acute Care Choice: Home Health, Durable Medical Equipment          DME Arranged: Bedside commode DME Agency: [employer] Date DME Agency Contacted: 03/19/23   Representative spoke with at DME Agency: Lelon Mast HH Arranged: PT, OT, Speech Therapy HH Agency: [facility] Date [facility] Agency Contacted: 03/19/23   Representative spoke with at [facility] Agency: Kandee Keen  Social Determinants of Health (SDOH) Interventions SDOH Screenings   Food Insecurity: No Food Insecurity (03/18/2023)  Housing: Low Risk  (03/18/2023)  Transportation Needs: No Transportation Needs (03/18/2023)  Utilities: Not At Risk (03/18/2023)  Tobacco Use: Medium Risk (03/18/2023)     Readmission Risk Interventions     No data to display

## 2023-03-19 NOTE — Progress Notes (Signed)
  Echocardiogram 2D Echocardiogram has been performed.  Teresa Kerr 03/19/2023, 12:12 PM

## 2023-03-19 NOTE — Progress Notes (Signed)
Echo attempted at 1012. Pt with speech therapy at this time. Will attempt again later.

## 2023-03-19 NOTE — Progress Notes (Signed)
    Durable Medical Equipment  (From admission, onward)           Start     Ordered   03/19/23 1330  For home use only DME Bedside commode  Once       Question:  Patient needs a bedside commode to treat with the following condition  Answer:  Weakness   03/19/23 1329            The patient requires a bedside commode as she is not able to make it in a timely manner to the restroom on the level of the home in which she will be staying.

## 2023-03-19 NOTE — Care Management CC44 (Signed)
Condition Code 44 Documentation Completed  Patient Details  Name: Teresa Kerr MRN: [id number] Date of Birth: 06-19-1951   Condition Code 44 given:  Yes Patient signature on Condition Code 44 notice:  Yes Documentation of 2 MD's agreement:  Yes Code 44 added to claim:  Yes    Kermit Balo, RN 03/19/2023, 2:52 PM

## 2023-03-19 NOTE — Progress Notes (Signed)
Nutrition Brief Note  New MD consult received on patient admitted with stroke-like symptoms. Met with pt at bedside and stated that her appetite is good now and prior to admission. States she has not noticed any difficulty chewing or swallowing and was cleared for a regular diet after passing her yale swallow evaluation at bedside.   No recent changes in intake or weight reported and pt well nourished on exam. No acute nutrition needs identified at this time. RD will not follow.  Wt Readings from Last 15 Encounters:  03/18/23 100.9 kg  03/15/20 106.6 kg  12/05/18 106.6 kg  03/04/18 108.9 kg  04/26/17 111.1 kg  04/21/17 109.8 kg  04/09/17 108.9 kg  01/31/17 108.9 kg  01/18/17 105.6 kg  09/01/15 117.1 kg  08/30/15 117.1 kg  08/09/15 117.9 kg  01/28/15 117.9 kg  01/12/15 117.9 kg  05/22/12 122.5 kg   Current diet order is carb modified, patient is consuming approximately 100% of meals at this time. Labs and medications reviewed.   No nutrition interventions warranted at this time. If nutrition issues arise, please consult RD.   Greig Castilla, RD, LDN Clinical Dietitian RD pager # available in [facility]  After hours/weekend pager # available in [facility]

## 2023-03-19 NOTE — Evaluation (Signed)
Speech Language Pathology Evaluation Patient Details Name: Teresa Kerr MRN: [id number] DOB: [birth year] Today's Date: 03/19/2023 Time: 0940-1020 SLP Time Calculation (min) (ACUTE ONLY): 40 min  Problem List:  Patient Active Problem List   Diagnosis Date Noted   Stroke-like symptoms 03/18/2023   Dyslipidemia 03/18/2023   Impingement syndrome of right shoulder region 09/09/2019   Degeneration of lumbar intervertebral disc 06/03/2018   Hip pain 06/03/2018   Knee pain 06/03/2018   RBBB 01/31/2017   Elevated troponin 01/31/2017   Influenza 01/16/2017   Anxiety 01/16/2017   Chest pain 01/16/2017   Diabetes mellitus with complication (HCC)    COPD exacerbation (HCC)    Influenza B    Thyroid nodule    Acute respiratory failure with hypoxia (HCC)    Pleural effusion, R, ? hemothorax 04/24/2012   Community acquired pneumonia 04/20/2012   Hypoxia 04/20/2012   Shingles 04/20/2012   Rib pain 04/20/2012   Hypertension 04/20/2012   History of stroke 04/20/2012   Chronic anticoagulation 04/20/2012   Diabetes mellitus (HCC) 04/20/2012   FIBROIDS, UTERUS 05/16/2007   HX, PERSONAL, TOBACCO USE 05/16/2007   GOUT 05/14/2007   OBESITY 05/14/2007   Essential hypertension 05/14/2007   ALLERGIC RHINITIS, SEASONAL 05/14/2007   Asthma 05/14/2007   Past Medical History:  Past Medical History:  Diagnosis Date   Asthma    Diabetes mellitus    Gout    Hypertension    Neuromuscular disorder (HCC)    right sides weakness arm and leg, steroid injections to right shoulder-Dr. Beane(last 8'16)   Stroke (HCC) 2002, 1999   pt has right sided weakness at times, gets ESI when weakness returns   Umbilical hernia    Uterine fibroid    Past Surgical History:  Past Surgical History:  Procedure Laterality Date   ANGIOPLASTY     x 2 times-cerebral arterial stents- [place]   CYSTOSCOPY WITH RETROGRADE PYELOGRAM, URETEROSCOPY AND STENT PLACEMENT Left 09/01/2015   Procedure: CYSTOSCOPY WITH  LEFT RETROGRADE, LEFT URETEROSCOPY AND STENT PLACEMENT;  Surgeon: Bjorn Pippin, MD;  Location: [facility];  Service: Urology;  Laterality: Left;   HAND SURGERY     HOLMIUM LASER APPLICATION Left 09/01/2015   Procedure: WITH HOLMIUM LASER ;  Surgeon: Bjorn Pippin, MD;  Location: [facility];  Service: Urology;  Laterality: Left;   IR ANGIO INTRA EXTRACRAN SEL COM CAROTID INNOMINATE BILAT MOD SED  03/04/2018   IR ANGIO VERTEBRAL SEL SUBCLAVIAN INNOMINATE UNI R MOD SED  03/04/2018   IR ANGIO VERTEBRAL SEL VERTEBRAL UNI L MOD SED  03/04/2018   TOE SURGERY     HPI:  pt is a 72 yo female adm to [facility] after falling from toilet- Pt found to have small acute infarcts impacting th left hemisphere including the external capsule, deep insula and left parietal- suspect embolic. Pt with PMH + for multiple CVAs - she probably had a stroke in 1999, 2001 and 2002 (per chart).  Per MD note, pt may benefit from AIR on dc'.  Pt lives alone and has help come in 3 days a week for 3 hours- dtr helps pt manage finances and aid makes sure pt takes her medications.  Speech eval ordered. Pt reports worsening issues with memory since her fall.   Assessment / Plan / Recommendation Clinical Impression  Pt greeted sitting upright in her chair; reports significant rib pain - provided ice pack and RN informed of pt's request for medication.  Portions of Western Aphasia Battery administered with pt scoring 7/10 for  spontaneous content; fluency 9/10, auditory verbal comprehension 10/10, sequential commands 4/10, repetition 6/10, object naming 10/10, writing 8/10, apraxia 10/10.  She is dysfluent with word finding deficits, semantic and phonemic paraphasias but she is persistant and is able to express herself with extra time.  Higher level receptive language skills *following complex directions are also compromised.  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  Dysarthria also present that may furhter impact pt's comprehensibility. She will  benefit from skilled SLP to maximize her functional speech/language to decrease caregiver burden. Pt agreeable to plan.    SLP Assessment  SLP Recommendation/Assessment: Patient needs continued Speech Lanaguage Pathology Services SLP Visit Diagnosis: Aphasia (R47.01)    Recommendations for follow up therapy are one component of a multi-disciplinary discharge planning process, led by the attending physician.  Recommendations may be updated based on patient status, additional functional criteria and insurance authorization.    Follow Up Recommendations   (SLP at next venue of care)    Assistance Recommended at Discharge  PRN  Functional Status Assessment Patient has had a recent decline in their functional status and demonstrates the ability to make significant improvements in function in a reasonable and predictable amount of time.  Frequency and Duration min 1 x/week  1 week      SLP Evaluation Cognition  Overall Cognitive Status: Within Functional Limits for tasks assessed Arousal/Alertness: Awake/alert Orientation Level: Oriented X4 Year: 2024 Month: June Day of Week: Correct Attention: Focused Focused Attention: Appears intact Awareness: Impaired Awareness Impairment: Other (comment) (benefited from SLP pointing out spelling errors initially, monitored independently with 2nd attempt) Safety/Judgment: Appears intact Comments: discussion re: options for medical administration including using med boxes with timers, wifi access, etc       Comprehension  Auditory Comprehension Overall Auditory Comprehension: Impaired Yes/No Questions: Within Functional Limits Commands: Impaired One Step Basic Commands: 75-100% accurate Two Step Basic Commands: 50-74% accurate Multistep Basic Commands: 50-74% accurate Conversation: Complex Interfering Components: Working [employer] fast streamer;Motor planning EffectiveTechniques: Repetition;Stressing words Visual Recognition/Discrimination Discrimination:  Not tested Reading Comprehension Reading Status:  (pt read information on clock and identified it as correct date, read correct name of index finger with choice of 2)    Expression Expression Primary Mode of Expression: Verbal Verbal Expression Overall Verbal Expression: Impaired Initiation: Impaired Level of Generative/Spontaneous Verbalization: Conversation Repetition: Impaired Level of Impairment: Phrase level;Sentence level (multisyllabic) Interfering Components: Speech intelligibility Effective Techniques: Written cues Non-Verbal Means of Communication: Not applicable Written Expression Dominant Hand: Right Written Expression: Exceptions to [facility] Self Formulation Ability:  (content words are present, pt with some grammatical and spelling errors which she reports are new)   Oral / Motor  Oral Motor/Sensory Function Overall Oral Motor/Sensory Function: Mild impairment Facial ROM: Reduced right;Suspected CN VII (facial) dysfunction Facial Symmetry: Abnormal symmetry right;Suspected CN VII (facial) dysfunction Facial Strength: Reduced right Lingual ROM: Reduced right;Suspected CN XII (hypoglossal) dysfunction (slight) Lingual Symmetry: Within Functional Limits Lingual Strength: Reduced Lingual Sensation:  (dnt) Velum: Within Functional Limits Motor Speech Overall Motor Speech: Appears within functional limits for tasks assessed Respiration: Within functional limits Phonation: Hoarse;Low vocal intensity (pt reports this is new) Resonance: Within functional limits Articulation: Impaired Level of Impairment:  (multisyllabic words) Motor Planning: Impaired Level of Impairment: Word (multisyllabic) Motor Speech Errors: Groping for words Effective Techniques: Slow rate            Chales Abrahams 03/19/2023, 10:42 AM  Rolena Infante, MS [facility] SLP Acute Rehab Services Office [phone]

## 2023-03-19 NOTE — Progress Notes (Signed)
ANTICOAGULATION CONSULT NOTE - Initial Consult  Pharmacy Consult for apixaban Indication:  Hx RLE DVT failed Xarelto therapy  Allergies  Allergen Reactions   Norco [Hydrocodone-Acetaminophen] Itching   Latex Itching, Rash and Other (See Comments)    "Powdered gloves"     Patient Measurements: Height: 5\' 4"  (162.6 cm) Weight: 100.9 kg (222 lb 7.1 oz) IBW/kg (Calculated) : 54.7   Vital Signs: Temp: 98.7 F (37.1 C) (06/25 1203) Temp Source: Oral (06/25 1203) BP: 142/78 (06/25 1203) Pulse Rate: 85 (06/25 1203)  Labs: Recent Labs    03/18/23 1043 03/18/23 1049 03/19/23 1029  HGB 14.3 15.0  --   HCT 43.3 44.0  --   PLT 257  --   --   APTT 30  --   --   LABPROT 19.7*  --   --   INR 1.6*  --   --   CREATININE 1.11* 1.10* 0.95    Estimated Creatinine Clearance: 64.6 mL/min (by C-G formula based on SCr of 0.95 mg/dL).   Medical History: Past Medical History:  Diagnosis Date   Asthma    Diabetes mellitus    Gout    Hypertension    Neuromuscular disorder (HCC)    right sides weakness arm and leg, steroid injections to right shoulder-Dr. Lennie Hummer 8'16)   Stroke (HCC) 2002, 1999   pt has right sided weakness at times, gets ESI when weakness returns   Umbilical hernia    Uterine fibroid     Medications:  Medications Prior to Admission  Medication Sig Dispense Refill Last Dose   acetaminophen (TYLENOL) 325 MG tablet Take 325-650 mg by mouth every 6 (six) hours as needed for mild pain, moderate pain, fever or headache.   Past Week   amLODipine (NORVASC) 10 MG tablet Take 10 mg by mouth daily.     03/17/2023   atorvastatin (LIPITOR) 10 MG tablet Take 10 mg by mouth daily.  3 03/17/2023   cloNIDine (CATAPRES) 0.1 MG tablet Take 0.1 mg by mouth daily.   03/17/2023   colchicine 0.6 MG tablet Take 0.6 mg by mouth daily as needed (gout).   03/17/2023   DULoxetine (CYMBALTA) 20 MG capsule Take 20 mg by mouth daily.   03/17/2023   ergocalciferol (VITAMIN D2) 1.25 MG (50000 UT)  capsule Take 50,000 Units by mouth See admin instructions. 50,000 units once weekly on Tuesday   Past Week   Fluticasone-Salmeterol (ADVAIR) 250-50 MCG/DOSE AEPB Inhale 1 puff into the lungs daily.   03/17/2023   furosemide (LASIX) 20 MG tablet Take 20 mg by mouth daily.    03/17/2023   gabapentin (NEURONTIN) 300 MG capsule Take 300 mg by mouth 2 (two) times daily.   03/17/2023   JANUVIA 100 MG tablet Take 100 mg by mouth daily.   03/17/2023   LEVEMIR FLEXTOUCH 100 UNIT/ML Pen Inject 50 Units into the skin at bedtime. (Patient taking differently: Inject 15 Units into the skin at bedtime.) 15 mL 1 Past Week   lisinopril (PRINIVIL,ZESTRIL) 40 MG tablet Take 40 mg by mouth daily.   3 03/17/2023   metFORMIN (GLUCOPHAGE) 500 MG tablet Take 500 mg by mouth 2 (two) times daily.   Past Week   rivaroxaban (XARELTO) 20 MG TABS tablet Take 20 mg by mouth daily.   03/17/2023 at 1130   Scheduled:   apixaban  5 mg Oral BID   atorvastatin  40 mg Oral q1800   diclofenac  1 patch Transdermal BID   DULoxetine  20 mg  Oral Daily   gabapentin  300 mg Oral BID   insulin aspart  0-15 Units Subcutaneous TID WC   insulin aspart  0-5 Units Subcutaneous QHS   insulin glargine-yfgn  15 Units Subcutaneous QHS   linagliptin  5 mg Oral Daily    Assessment: 7 YOF with a PMH of RLE DVT treated with Xarelto admitted for Acute left hemispheric external capsule infarct-multiple other areas suggestive of embolic disease. Xarelto has been deemed as a treatment failure. Consult received to start apixaban this evening.   Goal of Therapy:  Monitor platelets by anticoagulation protocol: Yes   Plan:  DC Xatelto on discharge med list Start apixaban 5 mg po bid 6/25 with evening dose Continue to monitor for signs of bleeding Pharmacy will sign off consult but continue to monitor making recommendations prn  Thank you  Greta Doom BS, PharmD, BCPS Clinical Pharmacist 03/19/2023 1:04 PM  Contact: [phone] after 3 PM  "Be  curious, not judgmental..." -Debbora Dus

## 2023-03-19 NOTE — Care Management Obs Status (Signed)
MEDICARE OBSERVATION STATUS NOTIFICATION   Patient Details  Name: Teresa Kerr MRN: [id number] Date of Birth: Oct 10, 1950   Medicare Observation Status Notification Given:  Yes    Kermit Balo, RN 03/19/2023, 2:52 PM

## 2023-03-19 NOTE — Evaluation (Signed)
Occupational Therapy Evaluation Patient Details Name: Teresa Kerr MRN: [id number] DOB: July 12, 1951 Today's Date: 03/19/2023   History of Present Illness Pt is a 72 y.o. F who presents 03/18/2023 with slurred speech and right facial droop. NIH was 3. CT with no acute abnormality. CTA head and neck no LVO, but chronic left M1 stenosis, atherosclerosis bilateral ICA siphon and bulb, bilateral PCA stenosis right more than left. MRI with numerous small acute infarctions affecting the left hemisphere in  the region of the external capsule, deep insula and left parietal lobe. 2 punctate acute infarctions in the right hemisphere, 1 at the  frontoparietal junction in the other in the mesial temporal lobe. 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 Corliss Skains.   Clinical Impression   PTA, pt lives alone, has aide assist 6 days/week for 3 hours per day to assist with showers and ADLs. Pt typically ambulatory with RW/cane and denies any other recent falls. Pt presents now with R sided pain as primary deficit along with speech. Overall, pt able to mobilize in-room without AD at min guard, Setup for UB ADL and min guard to Min A for LB ADLs. Educated on positional strategies during ADLs to minimize R rib pain, use of DME more consistently to maximize safety w/ mobility. Anticipate quick improvement to PLOF as pain subsides.     Recommendations for follow up therapy are one component of a multi-disciplinary discharge planning process, led by the attending physician.  Recommendations may be updated based on patient status, additional functional criteria and insurance authorization.   Assistance Recommended at Discharge Intermittent Supervision/Assistance  Patient can return home with the following A little help with  bathing/dressing/bathroom;Assistance with cooking/housework;Assist for transportation;Help with stairs or ramp for entrance    Functional Status Assessment  Patient has had a recent decline in their functional status and demonstrates the ability to make significant improvements in function in a reasonable and predictable amount of time.  Equipment Recommendations  None recommended by OT    Recommendations for Other Services       Precautions / Restrictions Precautions Precautions: Fall Restrictions Weight Bearing Restrictions: No      Mobility Bed Mobility Overal bed mobility: Independent                  Transfers Overall transfer level: Needs assistance Equipment used: None Transfers: Sit to/from Stand Sit to Stand: Supervision                  Balance Overall balance assessment: Needs assistance Sitting-balance support: Feet supported, No upper extremity supported Sitting balance-Leahy Scale: Good     Standing balance support: Bilateral upper extremity supported, During functional activity Standing balance-Leahy Scale: Fair Standing balance comment: fair+                           ADL either performed or assessed with clinical judgement   ADL Overall ADL's : Needs assistance/impaired Eating/Feeding: Independent   Grooming: Set up;Standing;Oral care Grooming Details (indicate cue type and reason): standing at sink for duration of task, leaning on sink some due to R rib pain Upper Body Bathing: Set up   Lower Body Bathing: Min guard;Sitting/lateral leans;Sit to/from stand   Upper Body Dressing : Set up;Sitting   Lower Body Dressing: Minimal assistance;Sit to/from  stand;Sitting/lateral leans Lower Body Dressing Details (indicate cue type and reason): some difficulty with socks/shoes at baseline (aide assisting as needed). unable to cross LE; educated on propping LE up on bed to allow easier reach with pt reporting she typically does use  this method at home though limited due to R rib pain Toilet Transfer: Min guard;Ambulation   Toileting- Clothing Manipulation and Hygiene: Supervision/safety;Sit to/from stand;Sitting/lateral lean       Functional mobility during ADLs: Min guard General ADL Comments: limited primarily by R rib pain from fall. discussed consistent use of DME as needed at home if pain persistent due to noted furniture walking in room without AD. Educated re: BE FAST stroke symptoms     Vision Ability to See in Adequate Light: 0 Adequate Patient Visual Report: No change from baseline Vision Assessment?: No apparent visual deficits     Perception     Praxis      Pertinent Vitals/Pain Pain Assessment Pain Assessment: Faces Faces Pain Scale: Hurts even more Pain Location: ribs on right side - where fell Pain Descriptors / Indicators: Guarding, Grimacing Pain Intervention(s): Monitored during session, Limited activity within patient's tolerance     Hand Dominance Right   Extremity/Trunk Assessment Upper Extremity Assessment Upper Extremity Assessment: Overall WFL for tasks assessed   Lower Extremity Assessment Lower Extremity Assessment: Defer to PT evaluation RLE Deficits / Details: At least 4/5 MMT for hip flexion, knee flex/ext, and DF/PF LLE Deficits / Details: Same as RLE.   Cervical / Trunk Assessment Cervical / Trunk Assessment: Normal   Communication Communication Communication: Expressive difficulties   Cognition Arousal/Alertness: Awake/alert Behavior During Therapy: WFL for tasks assessed/performed Overall Cognitive Status: Within Functional Limits for tasks assessed                                 General Comments: Some difficulty with expressing words but able to self correct with min cues. shows insight into deficits, safety precautions.     General Comments       Exercises     Shoulder Instructions      Home Living Family/patient expects to be  discharged to:: Private residence Living Arrangements: Alone Available Help at Discharge: Personal care attendant;Family;Available PRN/intermittently (6 days wk/3 hours) Type of Home: Apartment Home Access: Level entry;Stairs to enter Entrance Stairs-Number of Steps: 8 Entrance Stairs-Rails: Right Home Layout: Able to live on main level with bedroom/bathroom     Bathroom Shower/Tub: Chief Strategy Officer: Standard     Home Equipment: Cane - single Librarian, academic (2 wheels);Rollator (4 wheels);Other (comment) (reports a shower chair will not fit in tub; previously had a tub bench but will not fit w/ current setup)   Additional Comments: has a daughter that lives nearby with full handicapped accessible bathroom - will shower there sometimes.  Lives With: Alone    Prior Functioning/Environment Prior Level of Function : Needs assist       Physical Assist : Mobility (physical);ADLs (physical) Mobility (physical): Stairs ADLs (physical): Bathing;IADLs;Dressing Mobility Comments: Reports uses RW/SPC intermittently, aides help her with going around the house sometimes. denies recent falls ADLs Comments: reports aide assists with showering tasks, tub transfers - shower chair will not fit so has to stand for duration of task. some assist with LB dressing from aide. reports going to daughter's home on weekend if aide not available to assist. Aide does laundry and helps meal prep. pt able  to toilet self, manage most dressing        OT Problem List: Decreased activity tolerance;Impaired balance (sitting and/or standing);Pain      OT Treatment/Interventions: Self-care/ADL training;Therapeutic exercise;Energy conservation;DME and/or AE instruction;Therapeutic activities;Patient/family education    OT Goals(Current goals can be found in the care plan section) Acute Rehab OT Goals Patient Stated Goal: decrease pain OT Goal Formulation: With patient Time For Goal  Achievement: 04/02/23 Potential to Achieve Goals: Good  OT Frequency: Min 2X/week    Co-evaluation              AM-PAC OT "6 Clicks" Daily Activity     Outcome Measure Help from another person eating meals?: None Help from another person taking care of personal grooming?: A Little Help from another person toileting, which includes using toliet, bedpan, or urinal?: A Little Help from another person bathing (including washing, rinsing, drying)?: A Little Help from another person to put on and taking off regular upper body clothing?: A Little Help from another person to put on and taking off regular lower body clothing?: A Little 6 Click Score: 19   End of Session    Activity Tolerance: Patient tolerated treatment well;Patient limited by pain Patient left: in bed;with call bell/phone within reach;with bed alarm set;Other (comment) (eating lunch EOB - NT aware)  OT Visit Diagnosis: Unsteadiness on feet (R26.81);Other abnormalities of gait and mobility (R26.89)                Time: 1610-9604 OT Time Calculation (min): 26 min Charges:  OT General Charges $OT Visit: 1 Visit OT Evaluation $OT Eval Low Complexity: 1 Low OT Treatments $Self Care/Home Management : 8-22 mins  Bradd Canary, OTR/L Acute Rehab Services Office: [phone]   Lorre Munroe 03/19/2023, 12:49 PM

## 2023-03-19 NOTE — Evaluation (Signed)
Physical Therapy Evaluation Patient Details Name: Teresa Kerr MRN: [id number] DOB: 12/21/1950 Today's Date: 03/19/2023  History of Present Illness  Pt is a 72 y.o. F who presents 03/18/2023 with slurred speech and right facial droop. NIH was 3. CT with no acute abnormality. CTA head and neck no LVO, but chronic left M1 stenosis, atherosclerosis bilateral ICA siphon and bulb, bilateral PCA stenosis right more than left. MRI with numerous small acute infarctions affecting the left hemisphere in  the region of the external capsule, deep insula and left parietal lobe. 2 punctate acute infarctions in the right hemisphere, 1 at the  frontoparietal junction in the other in the mesial temporal lobe. 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 Corliss Skains.   Clinical Impression    PTA pt reports living alone at their private residence with [facility] aide's coming 6 day/week for 3 hours at a time. She reports needing assistance with bathing, grooming and other household chores. She uses a SPC/RW PRN for household ambulation. Today pt with supervision to min G for transfers and ambulation using a RW, amb 150' and limited by R side rib pain. Pt has some difficulties with expressing their words, sometimes fumbling at the end of their sentences. Pt will continue to benefit from skilled PT during this acute admission to facilitate improvements in gait, balance, and AD management. Current rec for HHPT for further improvements is agreeable to the pt at this time.   Recommendations for follow up therapy are one component of a multi-disciplinary discharge planning process, led by the attending physician.  Recommendations may be updated based on patient status, additional functional criteria and insurance authorization.  Follow Up  Recommendations       Assistance Recommended at Discharge Set up Supervision/Assistance  Patient can return home with the following  A little help with walking and/or transfers;A little help with bathing/dressing/bathroom;Assistance with cooking/housework;Assist for transportation;Help with stairs or ramp for entrance    Equipment Recommendations None recommended by PT  Recommendations for Other Services       Functional Status Assessment Patient has had a recent decline in their functional status and demonstrates the ability to make significant improvements in function in a reasonable and predictable amount of time.     Precautions / Restrictions Precautions Precautions: Fall Restrictions Weight Bearing Restrictions: No      Mobility  Bed Mobility Overal bed mobility: Independent             General bed mobility comments: Navigates to EOB dangle, only limited by R side pain    Transfers Overall transfer level: Needs assistance Equipment used: Rolling walker (2 wheels) Transfers: Sit to/from Stand Sit to Stand: Supervision           General transfer comment: Adequate BUE push off to achieve stand, RW introduced as pt reaches for external support    Ambulation/Gait Ambulation/Gait assistance: Min guard Gait Distance (Feet): 150 Feet Assistive device: Rolling walker (2 wheels) Gait Pattern/deviations: Step-through pattern, Decreased stride length Gait velocity: reduced Gait velocity interpretation: <1.8 ft/sec, indicate of risk for recurrent falls      Stairs            Wheelchair Mobility    Modified Rankin (Stroke Patients Only) Modified Rankin (Stroke Patients Only) Pre-Morbid Rankin Score: No symptoms Modified Rankin: Moderately severe disability  Balance Overall balance assessment: Needs assistance Sitting-balance support: Feet supported, No upper extremity supported Sitting balance-Leahy Scale: Good     Standing balance support:  Bilateral upper extremity supported, During functional activity Standing balance-Leahy Scale: Fair Standing balance comment: Initially stands with no AD, but reaches for external support for RW introduced.                             Pertinent Vitals/Pain Pain Assessment Pain Assessment: Faces Faces Pain Scale: Hurts even more Pain Location: ribs on right side - where fell Pain Descriptors / Indicators: Guarding, Grimacing Pain Intervention(s): Limited activity within patient's tolerance, Monitored during session    Home Living Family/patient expects to be discharged to:: Private residence Living Arrangements: Alone Available Help at Discharge: Personal care attendant Type of Home: Apartment Home Access: Level entry;Stairs to enter Entrance Stairs-Rails: Right Entrance Stairs-Number of Steps: 8   Home Layout: Able to live on main level with bedroom/bathroom Home Equipment: Cane - single point;Rolling Walker (2 wheels);Rollator (4 wheels) Additional Comments: Some difficulty with expressing words, reports she has a basement but is able to live on the level entry with no stairs to get inside. Reports that [facility] aide works 6 days a week for hours each day, helps with cooking/cleaning/transportation.    Prior Function Prior Level of Function : Needs assist       Physical Assist : Mobility (physical);ADLs (physical) Mobility (physical): Stairs;Transfers ADLs (physical): Bathing Mobility Comments: Reports uses RW/SPC intermittently, aides help her with going around the house sometimes ADLs Comments: Needs assist for bathing     Hand Dominance   Dominant Hand: Right    Extremity/Trunk Assessment   Upper Extremity Assessment Upper Extremity Assessment: Overall WFL for tasks assessed    Lower Extremity Assessment Lower Extremity Assessment: Overall WFL for tasks assessed;RLE deficits/detail;LLE deficits/detail RLE Deficits / Details: At least 4/5 MMT for hip flexion,  knee flex/ext, and DF/PF LLE Deficits / Details: Same as RLE.    Cervical / Trunk Assessment Cervical / Trunk Assessment: Normal  Communication   Communication: Expressive difficulties  Cognition Arousal/Alertness: Awake/alert Behavior During Therapy: WFL for tasks assessed/performed Overall Cognitive Status: Within Functional Limits for tasks assessed                                          General Comments      Exercises     Assessment/Plan    PT Assessment Patient needs continued PT services  PT Problem List Decreased activity tolerance;Decreased balance;Decreased mobility       PT Treatment Interventions DME instruction;Gait training;Stair training;Functional mobility training;Therapeutic activities;Therapeutic exercise;Balance training;Patient/family education    PT Goals (Current goals can be found in the Care Plan section)  Acute Rehab PT Goals Patient Stated Goal: get home PT Goal Formulation: With patient Time For Goal Achievement: 04/02/23 Potential to Achieve Goals: Good    Frequency Min 3X/week     Co-evaluation               AM-PAC PT "6 Clicks" Mobility  Outcome Measure Help needed turning from your back to your side while in a flat bed without using bedrails?: None Help needed moving from lying on your back to sitting on the side of a flat bed without using bedrails?: None Help needed moving to and from a bed to a chair (including  a wheelchair)?: A Little Help needed standing up from a chair using your arms (e.g., wheelchair or bedside chair)?: A Little Help needed to walk in hospital room?: A Little Help needed climbing 3-5 steps with a railing? : A Lot 6 Click Score: 19    End of Session Equipment Utilized During Treatment: Gait belt Activity Tolerance: Patient tolerated treatment well Patient left: in chair;with call bell/phone within reach;with chair alarm set Nurse Communication: Mobility status PT Visit Diagnosis:  Repeated falls (R29.6);Other symptoms and signs involving the nervous system (R29.898)    Time: 5732-2025 PT Time Calculation (min) (ACUTE ONLY): 22 min   Charges:   PT Evaluation $PT Eval Low Complexity: 1 Low          Hendricks Milo, SPT  Acute Rehabilitation Services   Hendricks Milo 03/19/2023, 11:46 AM

## 2023-03-19 NOTE — Care Management Obs Status (Signed)
MEDICARE OBSERVATION STATUS NOTIFICATION   Patient Details  Name: Teresa Kerr MRN: [id number] Date of Birth: 17-Apr-1951   Medicare Observation Status Notification Given:  Yes    Kermit Balo, RN 03/19/2023, 1:35 PM

## 2023-03-19 NOTE — Progress Notes (Signed)
PROGRESS NOTE   Teresa Kerr  UEA:[id number] DOB: Apr 07, 1951 DOA: 03/18/2023 PCP: Fleet Contras, MD  Brief Narrative:  71 year old black female Remote stroke not on Coumadin since 2018 first in 1999 ./?  2001 with left parietal cerebrovascular accident?  Embolic versus watershed--Left MCA stenosis with angioplasty Dr. Corliss Skains 08/2000-recurrent endovascular angioplasty 2007--- last follow-up Dr. 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 and is on Xarelto although missed a dose of it on 6/24 DM TY 2 HTN COPD/asthma  Brought to [facility] 12:30 AM fall X3 did hit her head unknown LOC called her home health aide that morning-garbled speech motor intact right-sided facial droop + deficit ABCD2 score 4 NIH score 3 CT head unchanged encephalomalacia left cerebral hemisphere consistent with prior infarct MR head numerous small infarctions left hemisphere external capsule insula left parietal lobe-2 additional punctate infarcts right hemisphere suggestive of embolic disease no mass  Neurology consulted  Hospital-Problem based course  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  Defer to neurology-unclear if missing the Xarelto for 1 day would constitute DOAC failure Await PT OT eval SLP has evaluated and feels that she has some aphasia with anomic aphasia and dysarthria Pending echo and further full workup Will keep on aspirin only as per neurology--- decision to be made with regards to DOAC versus Coumadin versus Lovenox going forward  Prior complicated stroke history as above  DM TY 2 A1c 12.3 indicative of poor control-Home Levemir is 50 units Sugars 100s to 290s Continue Lantus 15 eating 100% of meals Hold metformin for now but can resume in a.m. if no contrast load given--resume Januvia 100 daily  Hypertension Resume clonidine amlodipine Lasix lisinopril in 1 to 2  days allow permissive hypertension  Neuropathy NOS Resume gabapentin 300 twice daily  DVT prophylaxis: Lovenox Code Status: Full Family Communication: None at the bedside Disposition:  Status is: Observation The patient will require care spanning > 2 midnights and should be moved to inpatient because:   Patient requires workup and complex decision making with regards to anticoagulation in the setting of prior strokes     Subjective: Awake coherent no distress Main deficit at this time is prosody of speech-she however to me does not seem to have significant deficits is able to complete sentences She thinks she fell and hit herself on her back and there is a hematoma on her right flank Otherwise she is stable and has been ambulating with nursing-therapy services input is pending  Objective: Vitals:   03/18/23 2027 03/19/23 0013 03/19/23 0426 03/19/23 0718  BP: (!) 169/139 (!) 161/86 (!) 163/77 (!) 157/92  Pulse: 84 81 (!) 52 80  Resp: 18 18 18 17   Temp: 98 F (36.7 C) 98.2 F (36.8 C) 98 F (36.7 C) 98.3 F (36.8 C)  TempSrc: Oral Oral Oral Oral  SpO2: 96% 96% 93% 94%  Weight: 100.9 kg     Height: 5\' 4"  (1.626 m)       Intake/Output Summary (Last 24 hours) at 03/19/2023 0817 Last data filed at 03/19/2023 0800 Gross per 24 hour  Intake 150 ml  Output --  Net 150 ml   Filed Weights   03/18/23 1000 03/18/23 2027  Weight: 106 kg 100.9 kg    Examination:  EOMI NCAT thick neck Mallampati 4 no icterus no pallor no wheeze no rales no rhonchi 10 x 9 cm hematoma upper right flank Chest clear no wheeze rales  rhonchi S1-S2 on monitors PVCs with NSR Abdomen obese nontender no rebound Straight leg raise bilaterally equal Knee reflex is 2/3 bilaterally Power 5/5 in upper extremity-biceps brachioradialis deferred Mild twisting of mouth to the left    Data Reviewed: personally reviewed   CBC    Component Value Date/Time   WBC 12.0 (H) 03/18/2023 1043   RBC 5.03  03/18/2023 1043   HGB 15.0 03/18/2023 1049   HCT 44.0 03/18/2023 1049   PLT 257 03/18/2023 1043   MCV 86.1 03/18/2023 1043   MCH 28.4 03/18/2023 1043   MCHC 33.0 03/18/2023 1043   RDW 12.8 03/18/2023 1043   LYMPHSABS 2.5 03/18/2023 1043   MONOABS 1.0 03/18/2023 1043   EOSABS 0.1 03/18/2023 1043   BASOSABS 0.1 03/18/2023 1043      Latest Ref Rng & Units 03/18/2023   10:49 AM 03/18/2023   10:43 AM 03/15/2020   10:57 AM  CMP  Glucose 70 - 99 mg/dL 371  696  789   BUN 8 - 23 mg/dL 22  19  15    Creatinine 0.44 - 1.00 mg/dL 3.81  0.17  5.10   Sodium 135 - 145 mmol/L 138  135  143   Potassium 3.5 - 5.1 mmol/L 4.0  3.8  4.0   Chloride 98 - 111 mmol/L 102  101  109   CO2 22 - 32 mmol/L  25  23   Calcium 8.9 - 10.3 mg/dL  8.8  8.6   Total Protein 6.5 - 8.1 g/dL  6.9    Total Bilirubin 0.3 - 1.2 mg/dL  0.5    Alkaline Phos 38 - 126 U/L  81    AST 15 - 41 U/L  19    ALT 0 - 44 U/L  19       Radiology Studies: MR BRAIN WO CONTRAST  Result Date: 03/18/2023 CLINICAL DATA:  Neuro deficit, acute, stroke suspected. EXAM: MRI HEAD WITHOUT CONTRAST TECHNIQUE: Multiplanar, multiecho pulse sequences of the brain and surrounding structures were obtained without intravenous contrast. COMPARISON:  CT studies same day.  MRI 01/20/2020. FINDINGS: Brain: Diffusion imaging shows numerous small acute infarctions affecting the left hemisphere in the region of the external capsule, deep insula and left parietal lobe. There are 2 punctate acute infarctions in the right hemisphere, 1 at the frontoparietal junction in the other in the mesial temporal lobe. This distribution suggests embolic disease from the heart or ascending aorta. No large confluent infarction. No sign of mass effect or acute hemorrhage. Elsewhere, chronic small-vessel ischemic changes are seen affecting the cerebellum and cerebral hemispheric white matter. There is old infarction in the left posterior frontal lobe and left parietal lobe.  Vascular: Major vessels at the base of the brain show flow. Skull and upper cervical spine: Negative Sinuses/Orbits: Clear/normal Other: None IMPRESSION: 1. Numerous small acute infarctions affecting the left hemisphere in the region of the external capsule, deep insula and left parietal lobe. 2 punctate acute infarctions in the right hemisphere, 1 at the frontoparietal junction in the other in the mesial temporal lobe. This distribution suggests embolic disease from the heart or ascending aorta. No large confluent infarction. No sign of mass effect or acute hemorrhage. 2. Chronic small-vessel ischemic changes elsewhere affecting the cerebellum and cerebral hemispheric white matter. Old infarction in the left posterior frontal lobe and left parietal lobe. Electronically Signed   By: Paulina Fusi M.D.   On: 03/18/2023 16:27   DG CHEST PORT 1 VIEW  Result Date: 03/18/2023 CLINICAL  DATA:  Right rib contusion EXAM: PORTABLE CHEST 1 VIEW COMPARISON:  Chest radiograph dated 01/16/2017 FINDINGS: Normal lung volumes. No focal consolidations. No pleural effusion or pneumothorax. Enlarged cardiomediastinal silhouette. No radiographic finding of acute displaced fracture. IMPRESSION: 1. No radiographic finding of acute displaced fracture. 2. Cardiomegaly. Electronically Signed   By: Agustin Cree M.D.   On: 03/18/2023 14:50   CT ANGIO HEAD NECK W WO CM W PERF (CODE STROKE)  Result Date: 03/18/2023 CLINICAL DATA:  Neuro deficit, acute, stroke suspected. Slurred speech. Left facial droop. EXAM: CT ANGIOGRAPHY HEAD AND NECK CT PERFUSION BRAIN TECHNIQUE: Multidetector CT imaging of the head and neck was performed using the standard protocol during bolus administration of intravenous contrast. Multiplanar CT image reconstructions and MIPs were obtained to evaluate the vascular anatomy. Carotid stenosis measurements (when applicable) are obtained utilizing NASCET criteria, using the distal internal carotid diameter as the  denominator. Multiphase CT imaging of the brain was performed following IV bolus contrast injection. Subsequent parametric perfusion maps were calculated using RAPID software. RADIATION DOSE REDUCTION: This exam was performed according to the departmental dose-optimization program which includes automated exposure control, adjustment of the mA and/or kV according to patient size and/or use of iterative reconstruction technique. CONTRAST:  OMNIPAQUE IOHEXOL 350 MG/ML SOLN COMPARISON:  Head CT immediately prior. FINDINGS: CTA NECK FINDINGS Aortic arch: Aortic atherosclerosis. Branching pattern is normal without origin stenosis. Right carotid system: Common carotid artery widely patent to the bifurcation. Soft and calcified plaque at the distal common carotid artery but no stenosis. Carotid bifurcation and ICA bulb widely patent. Cervical ICA normal beyond that. Left carotid system: Common carotid artery shows some scattered plaque but is widely patent to the bifurcation. Soft and calcified plaque at the distal common carotid artery and carotid bifurcation but no stenosis. Cervical ICA widely patent to the skull base. Vertebral arteries: Both vertebral artery origins are patent. Both vertebral arteries are patent through the cervical region to the foramen magnum. Skeleton: Mild cervical spondylosis. Other neck: Enlarged and heterogeneous thyroid gland with multiple nodules, measuring up to 4 cm in size. Non emergent thyroid ultrasound recommended for further evaluation. No regional lymphadenopathy. Enhancing mass of the left parotid gland measuring 1 cm in size. This is larger than was seen on a MRI study of February 2020. Parotid ultrasound recommended for further evaluation. Upper chest: Mild emphysema.  No active process. Review of the MIP images confirms the above findings CTA HEAD FINDINGS Anterior circulation: Both internal carotid arteries are patent through the skull base and siphon regions. There is  ordinary siphon atherosclerotic calcification but no stenosis greater than 30% suspected. On the right, the anterior and middle cerebral arteries are patent without flow limiting stenosis. On the left, there is atherosclerotic change in the siphon but without stenosis greater than 30%. The anterior and middle cerebral vessels are patent. Mild chronic narrowing of the M1 segment without acute large vessel occlusion in that territory. Posterior circulation: Both vertebral arteries are patent through the foramen magnum to the basilar artery. The superior cerebellar and posterior cerebral arteries remain patent. There is pronounced atherosclerotic irregularity in the PCA branches, more extensive on the right than the left. Venous sinuses: Patent and normal. Anatomic variants: None other significant. Review of the MIP images confirms the above findings CT Brain Perfusion Findings: ASPECTS: 10, allowing for the old infarctions on the left. CBF (<30%) Volume: 0mL Perfusion (Tmax>6.0s) volume: 0mL Mismatch Volume: 0mL Infarction Location:None IMPRESSION: 1. No acute large vessel occlusion. 2.  Atherosclerotic disease at both carotid bifurcations but without stenosis. 3. Atherosclerotic disease in both carotid siphon regions but without stenosis greater than 30%. 4. Chronic atherosclerotic narrowing of the left M1 segment without acute occlusion. 5. Pronounced atherosclerotic irregularity in the PCA branches, more extensive on the right than the left. 6. No acute perfusion abnormality. 7. Enlarged and heterogeneous thyroid gland with multiple nodules, measuring up to 4 cm in size. Non emergent thyroid ultrasound recommended for further evaluation. 8. Enhancing mass of the left parotid gland measuring 1 cm in size. This is larger than was seen on a MRI study of February 2020. Parotid ultrasound recommended for further evaluation. Aortic Atherosclerosis (ICD10-I70.0) and Emphysema (ICD10-J43.9). Electronically Signed   By: Paulina Fusi M.D.   On: 03/18/2023 11:16   CT HEAD CODE STROKE WO CONTRAST  Result Date: 03/18/2023 CLINICAL DATA:  Code stroke. Neuro deficit, acute, stroke suspected. Slurred speech. Left-sided facial droop. EXAM: CT HEAD WITHOUT CONTRAST TECHNIQUE: Contiguous axial images were obtained from the base of the skull through the vertex without intravenous contrast. RADIATION DOSE REDUCTION: This exam was performed according to the departmental dose-optimization program which includes automated exposure control, adjustment of the mA and/or kV according to patient size and/or use of iterative reconstruction technique. COMPARISON:  07/17/2021 CT.  01/20/2020 MRI. FINDINGS: Brain: No focal abnormality seen affecting the brainstem or cerebellum. Right cerebral hemisphere shows chronic small-vessel ischemic changes, most pronounced in the forceps major region. Left cerebral hemisphere shows chronic small-vessel ischemic changes of the white matter and old cortical/subcortical infarction in the left posterior frontal lobe and left parietal lobe. No sign of acute infarction by CT. No mass, hemorrhage, hydrocephalus or extra-axial collection. Vascular: There is atherosclerotic calcification of the major vessels at the base of the brain. Skull: Negative Sinuses/Orbits: Clear/normal Other: None ASPECTS ([place]ta Stroke Program Early CT Score) - Ganglionic level infarction (caudate, lentiform nuclei, internal capsule, insula, M1-M3 cortex): 7, allowing for the old infarctions. - Supraganglionic infarction (M4-M6 cortex): 3, allowing for the old infarctions. Total score (0-10 with 10 being normal): 10, allowing for the old infarctions. IMPRESSION: 1. No acute finding by CT. Chronic small-vessel ischemic changes of the cerebral hemispheric white matter. 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 2. Aspects is 10, allowing for the old infarctions. These results were communicated to  Dr. Roda Shutters at 11:03 am on 03/18/2023 by text page via the [facility] messaging system. Electronically Signed   By: Paulina Fusi M.D.   On: 03/18/2023 11:04     Scheduled Meds:  aspirin  300 mg Rectal Daily   Or   aspirin EC  325 mg Oral Daily   atorvastatin  40 mg Oral q1800   DULoxetine  20 mg Oral Daily   enoxaparin (LOVENOX) injection  40 mg Subcutaneous Q24H   gabapentin  300 mg Oral BID   insulin aspart  0-15 Units Subcutaneous TID WC   insulin aspart  0-5 Units Subcutaneous QHS   insulin glargine-yfgn  15 Units Subcutaneous QHS   Continuous Infusions:   LOS: 0 days   Time spent: 5  Rhetta Mura, MD [facility] To contact the attending provider between 7A-7P or the covering provider during after hours 7P-7A, please log into the web site [email or web address] and access using universal [place] password for that web site. If you do not have the password, please call the hospital operator.  03/19/2023, 8:17 AM

## 2023-03-22 ENCOUNTER — Other Ambulatory Visit (HOSPITAL_COMMUNITY): Payer: Self-pay

## 2023-03-27 ENCOUNTER — Telehealth: Payer: Self-pay

## 2023-03-27 NOTE — Patient Outreach (Signed)
Received a red flag Emmi stroke notification. I have assigned Roshanda Florance, RN to call for follow up and determine if there are any Case Management needs.    Kaylani Fromme, CBCS, CMAA [facility] Care Management Assistant [facility] Care Management [phone]  

## 2023-03-27 NOTE — Patient Outreach (Signed)
  [facility] Stroke Care Coordination Follow Up  03/27/2023 Name:  Teresa Kerr MRN:  [id number] DOB:  04/16/1951  Subjective: Teresa Kerr is a 72 y.o. year old female who is a primary care patient of Fleet Contras, MD An Emmi alert was received indicating patient responded to questions: Questions/problems with meds?. I reached out by phone to follow up on the alert and spoke to Patient. Patient states she is doing fairly well. She reports she still has some pain/soreness to ribs and leg where she fell but taking Tylenol as needed. Reviewed and addressed red alert. Patient denies any issues with meds. She voices that her daughter manages her meds for her. She reports that she went to see PCP yesterday or follow up appt. Patient endorses that she does not see Dr. Concepcion Elk anymore but unable to recall name of new physician. Pt has neuro appt on 05/01/23. Pt states family takes her to appts. She is aware of transportation benefit via [facility] & Medicaid but voices she does not like to use it as she has to give "three day notice." Pt voices that [facility] services have been out to see her. She confirms she has DME in the home and using it as needed. She denies any RN CM needs or concerns at this time.    Care Coordination Interventions:  Yes, provided   TOC Interventions Today    Flowsheet Row Most Recent Value  TOC Interventions   TOC Interventions Discussed/Reviewed TOC Interventions Discussed      Interventions Today    Flowsheet Row Most Recent Value  Chronic Disease   Chronic disease during today's visit Other  [post stroke mgmt]  General Interventions   General Interventions Discussed/Reviewed General Interventions Discussed, Doctor Visits  Doctor Visits Discussed/Reviewed Doctor Visits Discussed, Specialist, PCP  PCP/Specialist Visits Compliance with follow-up visit  Education Interventions   Education Provided Provided Education  Provided Verbal Education On Nutrition, When to see the  doctor, Medication, Other  [sx mgmt]  Nutrition Interventions   Nutrition Discussed/Reviewed Nutrition Discussed, Adding fruits and vegetables, Increasing proteins, Decreasing fats, Decreasing salt, Decreasing sugar intake, Fluid intake  Pharmacy Interventions   Pharmacy Dicussed/Reviewed Pharmacy Topics Discussed, Medications and their functions  Safety Interventions   Safety Discussed/Reviewed Safety Discussed, Fall Risk, Home Safety  Home Safety Assistive Devices        Follow up plan: Advised patient that they would continue to get automated EMMI-Stroke post discharge calls to assess how they are doing following recent hospitalization and will receive a call from a nurse if any of their responses were abnormal. Patient voiced understanding and was appreciative of f/u call.   Encounter Outcome:  Pt. Visit Completed   Alessandra Grout [facility] Care Management Care Management Community Coordinator Direct Phone: [phone] Toll Free: [phone] Fax: [phone]

## 2023-05-01 ENCOUNTER — Encounter: Payer: Self-pay | Admitting: Neurology

## 2023-05-01 ENCOUNTER — Telehealth: Payer: Self-pay | Admitting: Neurology

## 2023-05-01 ENCOUNTER — Ambulatory Visit (INDEPENDENT_AMBULATORY_CARE_PROVIDER_SITE_OTHER): Payer: 59 | Admitting: Neurology

## 2023-05-01 VITALS — BP 136/80 | HR 85 | Ht 64.0 in | Wt 229.0 lb

## 2023-05-01 DIAGNOSIS — I63413 Cerebral infarction due to embolism of bilateral middle cerebral arteries: Secondary | ICD-10-CM

## 2023-05-01 DIAGNOSIS — Z794 Long term (current) use of insulin: Secondary | ICD-10-CM

## 2023-05-01 DIAGNOSIS — I10 Essential (primary) hypertension: Secondary | ICD-10-CM | POA: Diagnosis not present

## 2023-05-01 DIAGNOSIS — E119 Type 2 diabetes mellitus without complications: Secondary | ICD-10-CM | POA: Diagnosis not present

## 2023-05-01 DIAGNOSIS — Z8673 Personal history of transient ischemic attack (TIA), and cerebral infarction without residual deficits: Secondary | ICD-10-CM | POA: Diagnosis not present

## 2023-05-01 DIAGNOSIS — E785 Hyperlipidemia, unspecified: Secondary | ICD-10-CM

## 2023-05-01 DIAGNOSIS — I639 Cerebral infarction, unspecified: Secondary | ICD-10-CM | POA: Insufficient documentation

## 2023-05-01 NOTE — Patient Instructions (Signed)
I will order home health speech therapy   Continue Eliquis  Continue to work with your primary care provide for management of vascular risk factors Strict management of vascular risk factors with a goal BP less than 130/90, A1c less than 7.0, LDL less than 70 for secondary stroke prevention  Return here as needed

## 2023-05-01 NOTE — Progress Notes (Signed)
Patient: Teresa Kerr Date of Birth: Feb 27, 1951  Reason for Visit: Stroke Clinic Follow Up History from: Patient Primary Neurologist: Pearlean Brownie   ASSESSMENT AND PLAN 72 y.o. 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  Embolic likely secondary to cardioembolic source.  History of strokes with angioplasty of left MCA (2001, 2002).  Vascular risk factors: Uncontrolled type 2 diabetes (A1c 12.3), HTN, HLD.  On Xarelto PTA for DVT, switched to Eliquis.  Residual deficits of right sided facial droop, mild slurred speech.  -Referral to home health for continued speech therapy -Continue close follow-up with primary care provider for  strict management of vascular risk factors with a goal BP less than 130/90, A1c less than 7.0, LDL less than 70 for secondary stroke prevention -Remains on Eliquis -We discussed the importance of managing vascular risk factor for secondary stroke prevention.  We discussed healthy eating, exercise for stroke prevention  -Follow-up in our office on an as-needed basis  HISTORY OF PRESENT ILLNESS: Today 05/01/23 Here today for follow up since CVA. Has seen PCP. Remains on Eliquis. Her insulin has been increased, also on Januvia, metformin. Just got a continuous glucose monitor. On Lipitor. Still having slurred speech. Right sided facial droop. Completed ST, PT was extended. Living with her daughter for now, since she has low shower. Will then go back to her own apartment. She doesn't drive. Uses cane or walker at baseline. BP 136/80. Has had memory issues since initial strokes in early 2000's. Some worse since this event. Has had in home aide for several years, 6 days a week, helping with ADLs, meal prep, medication management. A friend brought her today.  HISTORY  Presented 03/18/2023 to the ER as a code stroke with slurred speech and right-sided facial droop.  She was outside of tPA window upon arrival.  She was on Xarelto.  History of  intracranial stenosis status post left MCA angioplasty with Dr. Corliss Skains.  CT head no acute abnormality.  CTA head and neck no LVO, but chronic left M1 stenosis, atherosclerosis bilateral ICA siphon on bulb, bilateral PCA stenosis right more than left.  MRI numerous small acute infarcts affecting the left hemisphere in the region of the external capsule, deep insula and left parietal lobe.  2 punctate acute infarcts in right hemisphere, 1 at frontoparietal junction and mesial temporal lobe.  2D echo EF 60 to 65%, LDL 102.  A1c 12.3.  UDS negative.  On Xarelto prior to arrival for DVT.  Was switched to Eliquis.  Started on Lipitor 40.  REVIEW OF SYSTEMS: Out of a complete 14 system review of symptoms, the patient complains only of the following symptoms, and all other reviewed systems are negative.  See HPI  ALLERGIES: Allergies  Allergen Reactions   Norco [Hydrocodone-Acetaminophen] Itching   Latex Itching, Rash and Other (See Comments)    "Powdered gloves"     HOME MEDICATIONS: Outpatient Medications Prior to Visit  Medication Sig Dispense Refill   acetaminophen (TYLENOL) 325 MG tablet Take 325-650 mg by mouth every 6 (six) hours as needed for mild pain, moderate pain, fever or headache.     amLODipine (NORVASC) 10 MG tablet Take 10 mg by mouth daily.       apixaban (ELIQUIS) 5 MG TABS tablet Take 1 tablet (5 mg total) by mouth 2 (two) times daily. 60 tablet 1   atorvastatin (LIPITOR) 10 MG tablet Take 10 mg by mouth daily.  3   cloNIDine (CATAPRES) 0.1 MG tablet Take  1 tablet (0.1 mg total) by mouth daily. 60 tablet 11   colchicine 0.6 MG tablet Take 0.6 mg by mouth daily as needed (gout).     diclofenac Sodium (VOLTAREN) 1 % GEL Apply 4 g topically 4 (four) times daily. 100 g 0   DULoxetine (CYMBALTA) 20 MG capsule Take 20 mg by mouth daily.     ergocalciferol (VITAMIN D2) 1.25 MG (50000 UT) capsule Take 50,000 Units by mouth See admin instructions. 50,000 units once weekly on Tuesday      Fluticasone-Salmeterol (ADVAIR) 250-50 MCG/DOSE AEPB Inhale 1 puff into the lungs daily.     furosemide (LASIX) 20 MG tablet Take 1 tablet (20 mg total) by mouth daily. 30 tablet    gabapentin (NEURONTIN) 300 MG capsule Take 300 mg by mouth 2 (two) times daily.     JANUVIA 100 MG tablet Take 100 mg by mouth daily.     LEVEMIR FLEXTOUCH 100 UNIT/ML FlexTouch Pen Inject 15 Units into the skin at bedtime.     lisinopril (ZESTRIL) 40 MG tablet Take 1 tablet (40 mg total) by mouth daily.  3   metFORMIN (GLUCOPHAGE) 500 MG tablet Take 500 mg by mouth 2 (two) times daily.     No facility-administered medications prior to visit.    PAST MEDICAL HISTORY: Past Medical History:  Diagnosis Date   Asthma    Diabetes mellitus    Gout    Hypertension    Neuromuscular disorder (HCC)    right sides weakness arm and leg, steroid injections to right shoulder-Dr. Beane(last 8'16)   Stroke (HCC) 2002, 1999   pt has right sided weakness at times, gets ESI when weakness returns   Umbilical hernia    Uterine fibroid     PAST SURGICAL HISTORY: Past Surgical History:  Procedure Laterality Date   ANGIOPLASTY     x 2 times-cerebral arterial stents- [facility]   CYSTOSCOPY WITH RETROGRADE PYELOGRAM, URETEROSCOPY AND STENT PLACEMENT Left 09/01/2015   Procedure: CYSTOSCOPY WITH LEFT RETROGRADE, LEFT URETEROSCOPY AND STENT PLACEMENT;  Surgeon: Bjorn Pippin, MD;  Location: [facility];  Service: Urology;  Laterality: Left;   HAND SURGERY     HOLMIUM LASER APPLICATION Left 09/01/2015   Procedure: WITH HOLMIUM LASER ;  Surgeon: Bjorn Pippin, MD;  Location: [facility];  Service: Urology;  Laterality: Left;   IR ANGIO INTRA EXTRACRAN SEL COM CAROTID INNOMINATE BILAT MOD SED  03/04/2018   IR ANGIO VERTEBRAL SEL SUBCLAVIAN INNOMINATE UNI R MOD SED  03/04/2018   IR ANGIO VERTEBRAL SEL VERTEBRAL UNI L MOD SED  03/04/2018   TOE SURGERY      FAMILY HISTORY: Family History  Problem Relation Age of Onset   Cancer Mother         breast/ had mastecomy   Cancer Father        colon    SOCIAL HISTORY: Social History   Socioeconomic History   Marital status: Widowed    Spouse name: Not on file   Number of children: 3   Years of education: Not on file   Highest education level: Not on file  Occupational History   Occupation: retired  Tobacco Use   Smoking status: Former    Current packs/day: 0.00    Average packs/day: 1.5 packs/day for 34.0 years (51.0 ttl pk-yrs)    Types: Cigarettes    Start date: 09/25/1963    Quit date: 09/24/1997    Years since quitting: 25.6   Smokeless tobacco: Never  Vaping Use  Vaping status: Never Used  Substance and Sexual Activity   Alcohol use: No   Drug use: No   Sexual activity: Not on file  Other Topics Concern   Not on file  Social History Narrative   Right handed   Caffeine none   Lives with daughter right now   Social Determinants of Health   Financial Resource Strain: Not on file  Food Insecurity: No Food Insecurity (03/18/2023)   Hunger Vital Sign    Worried About Running Out of Food in the Last Year: Never true    Ran Out of Food in the Last Year: Never true  Transportation Needs: No Transportation Needs (03/18/2023)   PRAPARE - Administrator, Civil Service (Medical): No    Lack of Transportation (Non-Medical): No  Physical Activity: Not on file  Stress: Not on file  Social Connections: Unknown (02/05/2022)   Received from [facility]   Social Network    Social Network: Not on file  Intimate Partner Violence: Not At Risk (03/18/2023)   Humiliation, Afraid, Rape, and Kick questionnaire    Fear of Current or Ex-Partner: No    Emotionally Abused: No    Physically Abused: No    Sexually Abused: No    PHYSICAL EXAM  Vitals:   05/01/23 1021  BP: 136/80  Pulse: 85  SpO2: 95%  Weight: 229 lb (103.9 kg)  Height: 5\' 4"  (1.626 m)   Body mass index is 39.31 kg/m.  Generalized: Well developed, in no acute distress  Neurological  examination  Mentation: Alert oriented to time, place, history taking. Follows all commands, mild to moderate slurred speech.  No aphasia. Cranial nerve II-XII: Pupils were equal round reactive to light. Extraocular movements were full, visual field were full on confrontational test.  Right sided facial droop to mouth noted. Motor: Good strength overall no notable weakness noted Sensory: Sensory testing is intact to soft touch on all 4 extremities. No evidence of extinction is noted.  Coordination: Cerebellar testing reveals good finger-nose-finger and heel-to-shin bilaterally.  Is intentional with finger-nose-finger. Gait and station: Gait is wide-based, cautious, uses single-point cane Reflexes: Deep tendon reflexes are symmetric and normal bilaterally.   DIAGNOSTIC DATA (LABS, IMAGING, TESTING) - I reviewed patient records, labs, notes, testing and imaging myself where available.  Lab Results  Component Value Date   WBC 12.0 (H) 03/18/2023   HGB 15.0 03/18/2023   HCT 44.0 03/18/2023   MCV 86.1 03/18/2023   PLT 257 03/18/2023      Component Value Date/Time   NA 136 03/19/2023 1029   K 4.2 03/19/2023 1029   CL 102 03/19/2023 1029   CO2 23 03/19/2023 1029   GLUCOSE 308 (H) 03/19/2023 1029   BUN 16 03/19/2023 1029   CREATININE 0.95 03/19/2023 1029   CALCIUM 8.8 (L) 03/19/2023 1029   PROT 6.9 03/18/2023 1043   ALBUMIN 3.3 (L) 03/18/2023 1043   AST 19 03/18/2023 1043   ALT 19 03/18/2023 1043   ALKPHOS 81 03/18/2023 1043   BILITOT 0.5 03/18/2023 1043   GFRNONAA >60 03/19/2023 1029   GFRAA >60 03/15/2020 1057   Lab Results  Component Value Date   CHOL 174 03/19/2023   HDL 33 (L) 03/19/2023   LDLCALC 102 (H) 03/19/2023   TRIG 194 (H) 03/19/2023   CHOLHDL 5.3 03/19/2023   Lab Results  Component Value Date   HGBA1C 12.3 (H) 03/18/2023   No results found for: "VITAMINB12" Lab Results  Component Value Date   TSH  0.625 09/27/2009    Margie Ege, AGNP-C, DNP 05/01/2023,  11:09 AM [facility] [address], [address] [place], [place] [address] [phone]

## 2023-05-01 NOTE — Progress Notes (Signed)
I agree with the above plan 

## 2023-05-01 NOTE — Telephone Encounter (Signed)
Sent referral to [facility] and they said: Teresa Kerr is currently active with [place] and receiving PT, OT and Speech Therapy.

## 2023-05-28 ENCOUNTER — Other Ambulatory Visit (HOSPITAL_COMMUNITY): Payer: Self-pay

## 2023-10-02 ENCOUNTER — Ambulatory Visit (INDEPENDENT_AMBULATORY_CARE_PROVIDER_SITE_OTHER): Payer: 59 | Admitting: Podiatry

## 2023-10-02 DIAGNOSIS — Z91199 Patient's noncompliance with other medical treatment and regimen due to unspecified reason: Secondary | ICD-10-CM

## 2023-10-02 NOTE — Progress Notes (Signed)
 1. No-show for appointment

## 2023-11-05 ENCOUNTER — Ambulatory Visit: Payer: 59 | Admitting: Podiatry

## 2024-01-23 ENCOUNTER — Telehealth: Payer: Self-pay | Admitting: Neurology

## 2024-01-23 NOTE — Telephone Encounter (Signed)
 Received clearance from [facility] for cataract extraction, outpatient basis at the surgical eye center.  The patient's risk profile to proceed is acceptable. Paperwork will be signed.

## 2024-01-27 NOTE — Telephone Encounter (Signed)
 Faxed paperwork to [facility]  at [phone]

## 2024-02-25 ENCOUNTER — Telehealth: Payer: Self-pay | Admitting: Obstetrics and Gynecology

## 2024-02-25 DIAGNOSIS — E2839 Other primary ovarian failure: Secondary | ICD-10-CM

## 2024-02-25 NOTE — Telephone Encounter (Signed)
 Coming for consult soon Needs dxa Will need to schedule 2 different appointments Thank you Dr. Tia Flowers

## 2024-03-12 ENCOUNTER — Ambulatory Visit: Admitting: Obstetrics and Gynecology

## 2024-03-21 ENCOUNTER — Encounter (HOSPITAL_COMMUNITY): Payer: Self-pay | Admitting: Interventional Radiology

## 2024-04-23 ENCOUNTER — Ambulatory Visit: Admitting: Obstetrics and Gynecology
